# Patient Record
Sex: Female | Born: 1980
Health system: Southern US, Community
[De-identification: ages and names within clinical notes are randomized; demographics above are authoritative.]

## PROBLEM LIST (undated history)

## (undated) DIAGNOSIS — I10 Essential (primary) hypertension: Secondary | ICD-10-CM

## (undated) DIAGNOSIS — Z720 Tobacco use: Secondary | ICD-10-CM

## (undated) DIAGNOSIS — F4541 Pain disorder exclusively related to psychological factors: Secondary | ICD-10-CM

## (undated) DIAGNOSIS — D649 Anemia, unspecified: Secondary | ICD-10-CM

## (undated) DIAGNOSIS — R011 Cardiac murmur, unspecified: Secondary | ICD-10-CM

## (undated) HISTORY — DX: Tobacco use: Z72.0

## (undated) HISTORY — DX: Pain disorder exclusively related to psychological factors: F45.41

---

## 2004-03-15 HISTORY — PX: CHOLECYSTECTOMY, LAPAROSCOPIC: SHX56

## 2005-10-29 ENCOUNTER — Encounter (INDEPENDENT_AMBULATORY_CARE_PROVIDER_SITE_OTHER): Payer: Self-pay | Admitting: *Deleted

## 2005-10-29 ENCOUNTER — Observation Stay (HOSPITAL_COMMUNITY): Admission: EM | Admit: 2005-10-29 | Discharge: 2005-10-30 | Payer: Self-pay | Admitting: Emergency Medicine

## 2007-08-23 DIAGNOSIS — O1490 Unspecified pre-eclampsia, unspecified trimester: Secondary | ICD-10-CM

## 2007-08-29 ENCOUNTER — Emergency Department (HOSPITAL_COMMUNITY): Admission: EM | Admit: 2007-08-29 | Discharge: 2007-08-29 | Payer: Self-pay | Admitting: Emergency Medicine

## 2010-07-31 NOTE — Op Note (Signed)
Angel Moore, Angel Moore                ACCOUNT NO.:  [id number]   MEDICAL RECORD NO.:  [id number]          PATIENT TYPE:  OBV   LOCATION:  A332                          FACILITY:  [facility]   PHYSICIAN:  Dalia Heading, M.D.  DATE OF BIRTH:  [birth year]   DATE OF PROCEDURE:  10/29/2005  DATE OF DISCHARGE:                                 OPERATIVE REPORT   PREOPERATIVE DIAGNOSIS:  Cholecystitis, cholelithiasis.   POSTOPERATIVE DIAGNOSIS:  Cholecystitis, cholelithiasis.   PROCEDURE:  Laparoscopic cholecystectomy.   SURGEON:  Dr. Franky Macho.   ANESTHESIA:  General endotracheal.   INDICATIONS:  The patient is a 30 year old black female presents with  worsening right upper quadrant abdominal pain, nausea, vomiting.  The  patient underwent ultrasound in the emergency room and was found to have  acute cholecystitis with cholelithiasis.  The patient now comes to the  operating room for laparoscopic cholecystectomy.  Risks and benefits of the  procedure including bleeding, infection, hepatobiliary injury, and the  possibility of an open procedure were fully explained to the patient, who  gave informed consent.   PROCEDURE NOTE:  The patient was placed in a supine position.  After  induction of general endotracheal anesthesia, the abdomen was prepped and  draped using the usual sterile technique with Betadine.  Surgical site  confirmation was performed.   A supraumbilical incision was made down to the fascia.  Veress needle was  introduced into the abdominal cavity, and confirmation of placement was done  using the saline drop test.  The abdomen was then insufflated to 16 mmHg  pressure.  An 11-mm trocar was introduced into the abdominal cavity under  direct visualization without difficulty.  An additional 11-mm trocar was  placed in the epigastric region, 5-mm trocars placed in the right upper  quadrant right flank regions.  Liver was inspected and noted to be within  normal limits.  The  gallbladder was retracted superior and laterally.  The  dissection was begun around the infundibulum of the gallbladder.  The cystic  duct was first identified.  Its juncture to the infundibulum fully  identified.  Endoclips placed proximally and distally on the cystic duct,  and the cystic duct was divided.  This was likewise done to the cystic  artery.  The gallbladder then freed away from the gallbladder fossa using  Bovie electrocautery.  The gallbladder was delivered through the epigastric  trocar site using EndoCatch bag.  The gallbladder fossa was inspected.  No  abnormal bleeding or bile leakage was noted.  Surgicel was placed in the  gallbladder fossa.  All fluid and air were then evacuated from the abdominal  cavity prior to removal of the trocars.   All wounds were irrigated with normal saline.  All wounds were injected with  0.5% Sensorcaine.  The supraumbilical fascia as well as epigastric fascia  reapproximated using 0 Vicryl interrupted sutures.  All skin incisions were  closed using staples.  Betadine ointment and dry sterile dressings were  applied.   All tape and needle counts correct at the end of the procedure.  The patient  was extubated in the operating room and went back to the recovery room awake  in stable condition.   COMPLICATIONS:  None.   SPECIMEN:  Gallbladder with stones.   BLOOD LOSS:  Minimal.      Dalia Heading, M.D.  Electronically Signed     MAJ/MEDQ  D:  10/29/2005  T:  10/29/2005  Job:  [id number]   cc:   [facility]

## 2010-07-31 NOTE — H&P (Signed)
NAMEELEASE, SWARM                ACCOUNT NO.:  [id number]   MEDICAL RECORD NO.:  [id number]          PATIENT TYPE:  EMS   LOCATION:  ED                            FACILITY:  [facility]   PHYSICIAN:  Dalia Heading, M.D.  DATE OF BIRTH:  Sep 11, 1980   DATE OF ADMISSION:  10/29/2005  DATE OF DISCHARGE:  [facility]                                HISTORY & PHYSICAL   CHIEF COMPLAINT:  Cholecystitis, cholelithiasis.   HISTORY OF PRESENT ILLNESS:  The patient is a 30 year old black female who  presents with a 2-week history of worsening right upper quadrant abdominal  pain, nausea, vomiting.  She presented to the emergency room for further  evaluation treatment.  Ultrasound gallbladder was performed which revealed  cholelithiasis.  Common bile duct is within normal limits.   PAST MEDICAL HISTORY:  Is unremarkable.   PAST SURGICAL HISTORY:  Unremarkable.   CURRENT MEDICATIONS:  None.   ALLERGIES:  No known drug allergies.   REVIEW OF SYSTEMS:  Noncontributory.  The patient does smoke cigarettes.   PHYSICAL EXAMINATION:  Patient is well-developed, well-nourished black  female in no acute distress.  HEENT: Examination reveals no scleral icterus.  LUNGS:  Clear to auscultation with equal breath sounds bilaterally.  HEART:  Examination reveals regular rate and rhythm without S3, S4, murmurs.  ABDOMEN:  Soft with tenderness in the right upper quadrant to palpation.  No  hepatosplenomegaly, masses, hernias identified.   White blood cell count 13.6, hematocrit 39, platelet count 366.  MET7 is  unremarkable.  Liver enzyme tests were within normal limits.   IMPRESSION:  Cholecystitis, cholelithiasis.   PLAN:  The patient would brought to hospital for laparoscopic  cholecystectomy today.  Risks and benefits of the procedure including  bleeding, infection, hepatobiliary, possibly an open procedure were fully  explained to the patient, gave informed consent.      Dalia Heading, M.D.  Electronically Signed     MAJ/MEDQ  D:  10/29/2005  T:  10/29/2005  Job:  [id number]   cc:   Short stay at [facility]

## 2010-12-10 LAB — URINE MICROSCOPIC-ADD ON

## 2010-12-10 LAB — CBC
MCHC: 34.7
MCV: 93.5
RBC: 3.55 — ABNORMAL LOW
RDW: 13.1
WBC: 15.5 — ABNORMAL HIGH

## 2010-12-10 LAB — COMPREHENSIVE METABOLIC PANEL
BUN: 5 — ABNORMAL LOW
Calcium: 8.7
Glucose, Bld: 110 — ABNORMAL HIGH
Sodium: 142
Total Protein: 6.2

## 2010-12-10 LAB — DIFFERENTIAL
Lymphs Abs: 2.6
Monocytes Relative: 7
Neutro Abs: 11.5 — ABNORMAL HIGH
Neutrophils Relative %: 74

## 2010-12-10 LAB — URINALYSIS, ROUTINE W REFLEX MICROSCOPIC
Leukocytes, UA: NEGATIVE
Protein, ur: NEGATIVE
Urobilinogen, UA: 0.2

## 2010-12-10 LAB — PROTIME-INR
INR: 0.9
Prothrombin Time: 12.4

## 2010-12-10 LAB — APTT: aPTT: 28

## 2017-12-04 ENCOUNTER — Emergency Department (HOSPITAL_COMMUNITY): Payer: Medicare Other

## 2017-12-04 ENCOUNTER — Emergency Department (HOSPITAL_COMMUNITY)
Admission: EM | Admit: 2017-12-04 | Discharge: 2017-12-05 | Disposition: A | Discharge status: Home / Self Care | Payer: Medicare Other | Attending: Emergency Medicine | Admitting: Emergency Medicine

## 2017-12-04 ENCOUNTER — Encounter (HOSPITAL_COMMUNITY): Payer: Self-pay | Admitting: Emergency Medicine

## 2017-12-04 ENCOUNTER — Other Ambulatory Visit: Payer: Self-pay

## 2017-12-04 DIAGNOSIS — D509 Iron deficiency anemia, unspecified: Secondary | ICD-10-CM

## 2017-12-04 DIAGNOSIS — D649 Anemia, unspecified: Secondary | ICD-10-CM | POA: Diagnosis not present

## 2017-12-04 DIAGNOSIS — R1013 Epigastric pain: Secondary | ICD-10-CM | POA: Diagnosis not present

## 2017-12-04 DIAGNOSIS — F17210 Nicotine dependence, cigarettes, uncomplicated: Secondary | ICD-10-CM | POA: Diagnosis not present

## 2017-12-04 LAB — COMPREHENSIVE METABOLIC PANEL
ALT: 10 U/L (ref 0–44)
ANION GAP: 6 (ref 5–15)
AST: 18 U/L (ref 15–41)
Albumin: 3.8 g/dL (ref 3.5–5.0)
Alkaline Phosphatase: 68 U/L (ref 38–126)
BUN: 6 mg/dL (ref 6–20)
CHLORIDE: 109 mmol/L (ref 98–111)
CO2: 24 mmol/L (ref 22–32)
Calcium: 8.7 mg/dL — ABNORMAL LOW (ref 8.9–10.3)
Creatinine, Ser: 0.59 mg/dL (ref 0.44–1.00)
GFR calc non Af Amer: >60 mL/min (ref >60)
Glucose, Bld: 110 mg/dL — ABNORMAL HIGH (ref 70–99)
Potassium: 3.9 mmol/L (ref 3.5–5.1)
SODIUM: 139 mmol/L (ref 135–145)
Total Bilirubin: 0.7 mg/dL (ref 0.3–1.2)
Total Protein: 7.2 g/dL (ref 6.5–8.1)

## 2017-12-04 LAB — CBC
HCT: 25.7 % — ABNORMAL LOW (ref 36.0–46.0)
HEMOGLOBIN: 6.9 g/dL — AB (ref 12.0–15.0)
MCH: 16.2 pg — AB (ref 26.0–34.0)
MCHC: 26.8 g/dL — AB (ref 30.0–36.0)
MCV: 60.5 fL — AB (ref 78.0–100.0)
Platelets: 283 10*3/uL (ref 150–400)
RBC: 4.25 MIL/uL (ref 3.87–5.11)
RDW: 20.5 % — ABNORMAL HIGH (ref 11.5–15.5)
WBC: 11.5 10*3/uL — ABNORMAL HIGH (ref 4.0–10.5)

## 2017-12-04 LAB — DIFFERENTIAL
Basophils Absolute: 0.1 10*3/uL (ref 0.0–0.1)
Basophils Relative: 0 %
Eosinophils Absolute: 0.1 10*3/uL (ref 0.0–0.7)
Eosinophils Relative: 1 %
LYMPHS ABS: 3.1 10*3/uL (ref 0.7–4.0)
LYMPHS PCT: 27 %
MONO ABS: 0.9 10*3/uL (ref 0.1–1.0)
Monocytes Relative: 8 %
NEUTROS ABS: 7.5 10*3/uL (ref 1.7–7.7)
NEUTROS PCT: 64 %

## 2017-12-04 LAB — URINALYSIS, ROUTINE W REFLEX MICROSCOPIC
BILIRUBIN URINE: NEGATIVE
GLUCOSE, UA: NEGATIVE mg/dL
Hgb urine dipstick: NEGATIVE
Ketones, ur: NEGATIVE mg/dL
Leukocytes, UA: NEGATIVE
Nitrite: NEGATIVE
PH: 6 (ref 5.0–8.0)
Protein, ur: NEGATIVE mg/dL
SPECIFIC GRAVITY, URINE: 1.005 (ref 1.005–1.030)

## 2017-12-04 LAB — LIPASE, BLOOD: LIPASE: 34 U/L (ref 11–51)

## 2017-12-04 LAB — PREGNANCY, URINE: Preg Test, Ur: NEGATIVE

## 2017-12-04 LAB — POC OCCULT BLOOD, ED: Fecal Occult Bld: NEGATIVE

## 2017-12-04 IMAGING — DX DG ABDOMEN ACUTE W/ 1V CHEST
3 series · 3 of 3 positions shown · non-contrast
Comparison: [DATE] abdominal ultrasound.

CLINICAL DATA: 36 y/o  F; intermittent abdominal pain for a year.

EXAM:
DG ABDOMEN ACUTE W/ 1V CHEST

[chest pa]
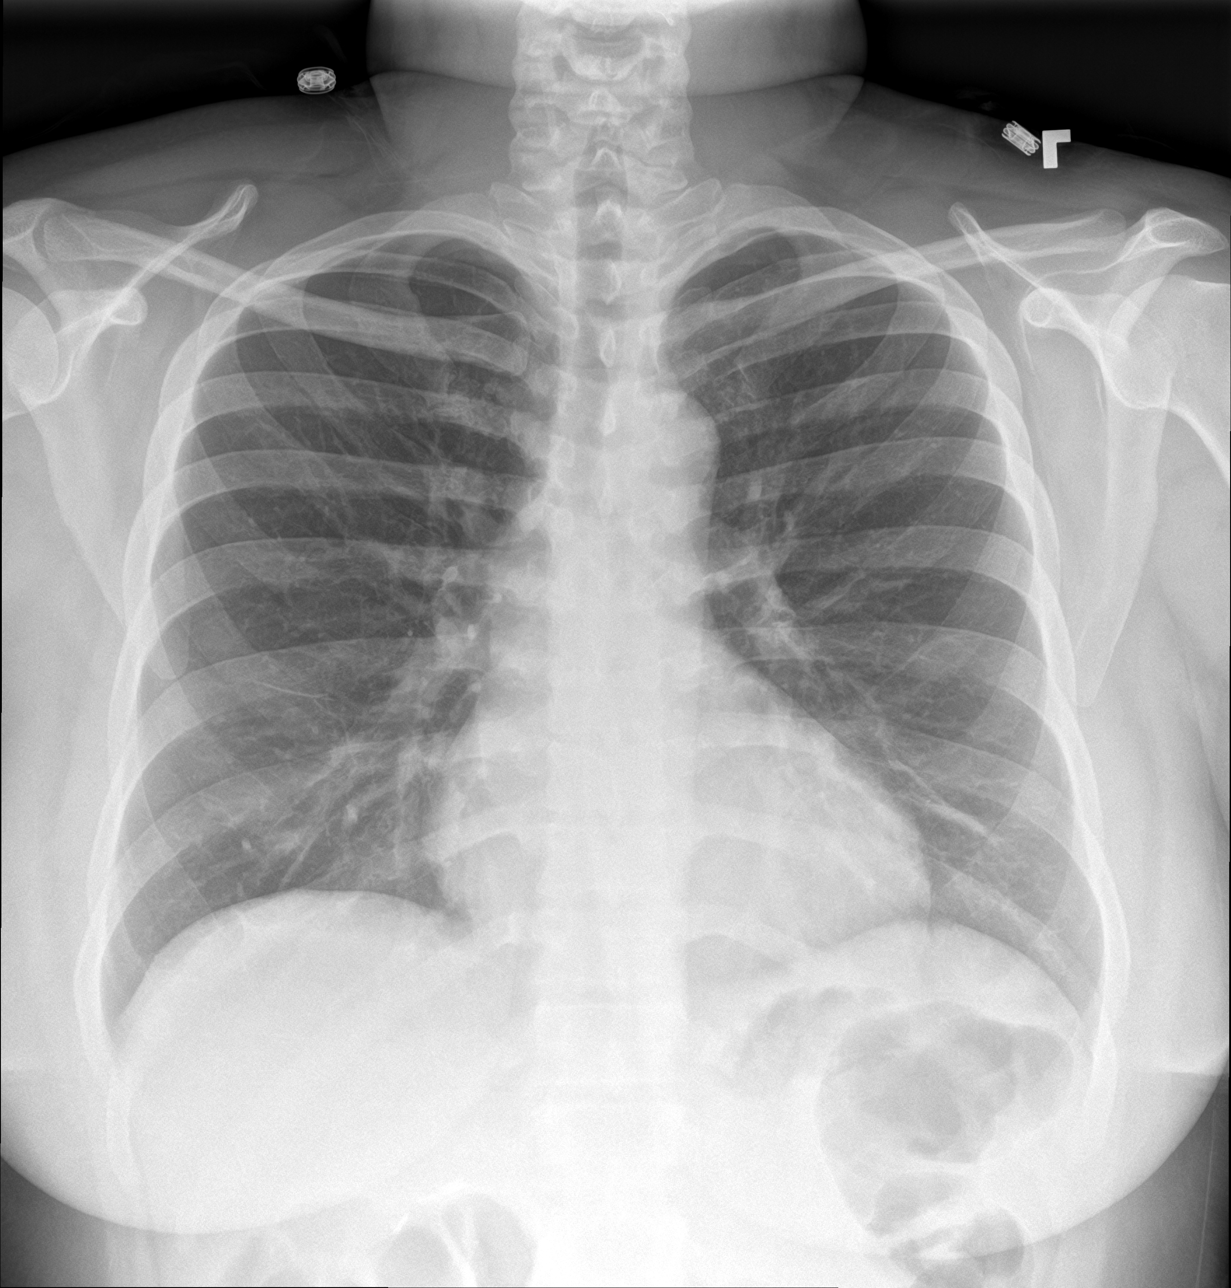

[abdomen erect]
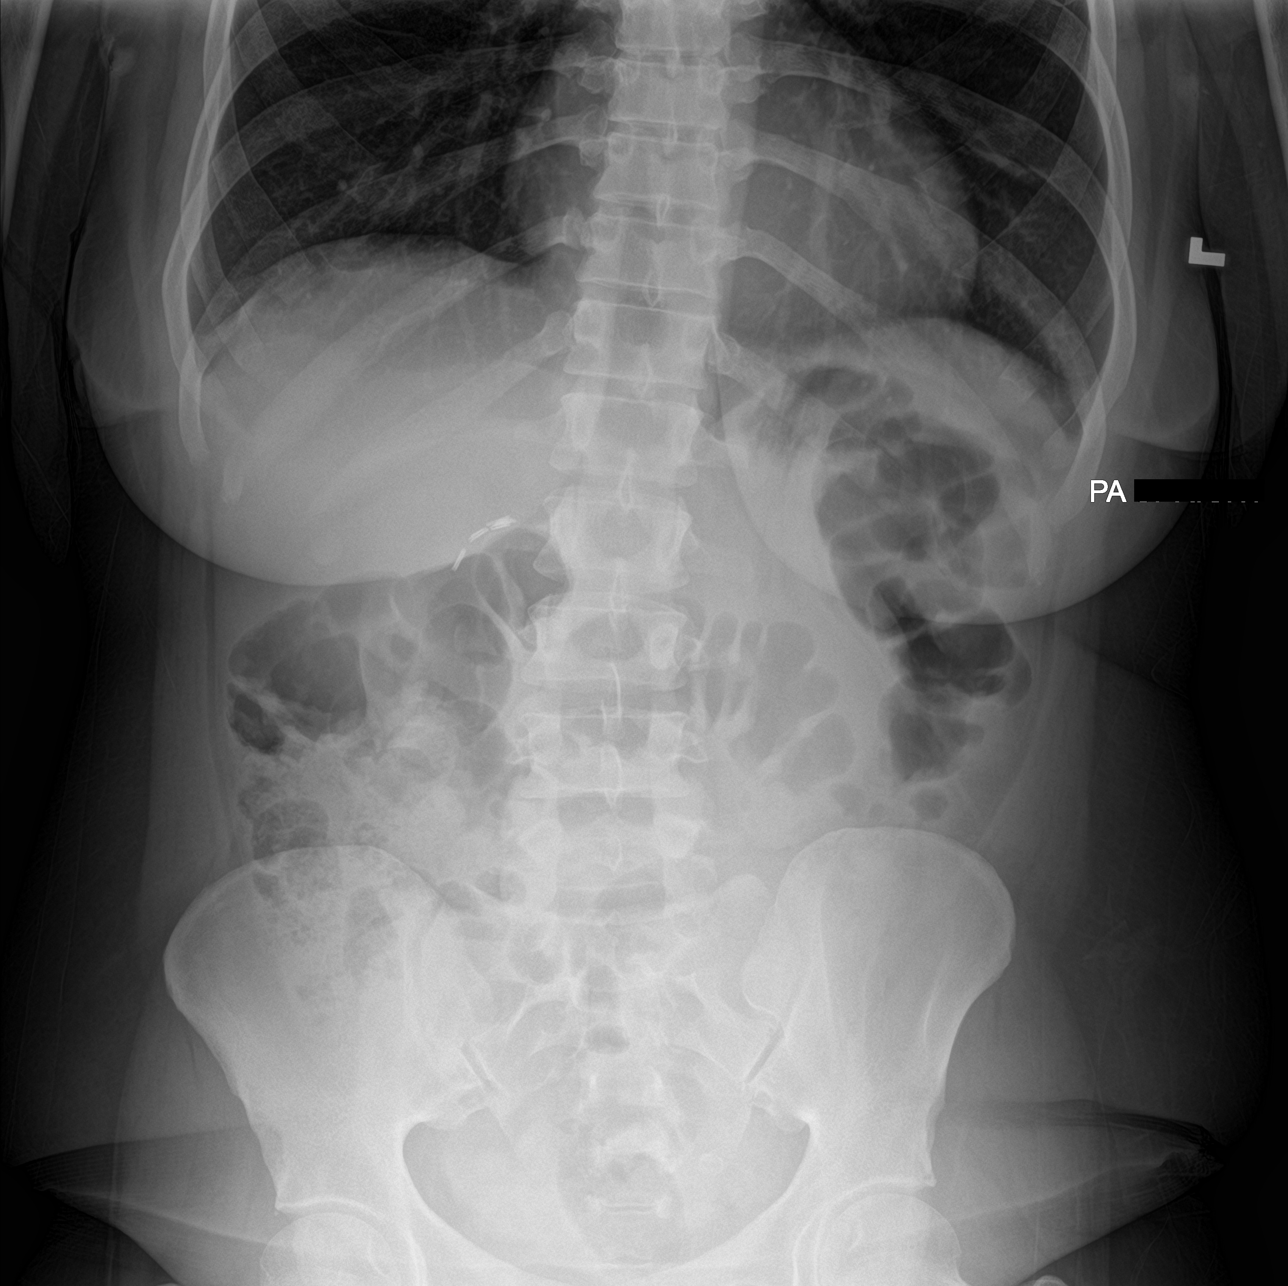

[abdomen supine]
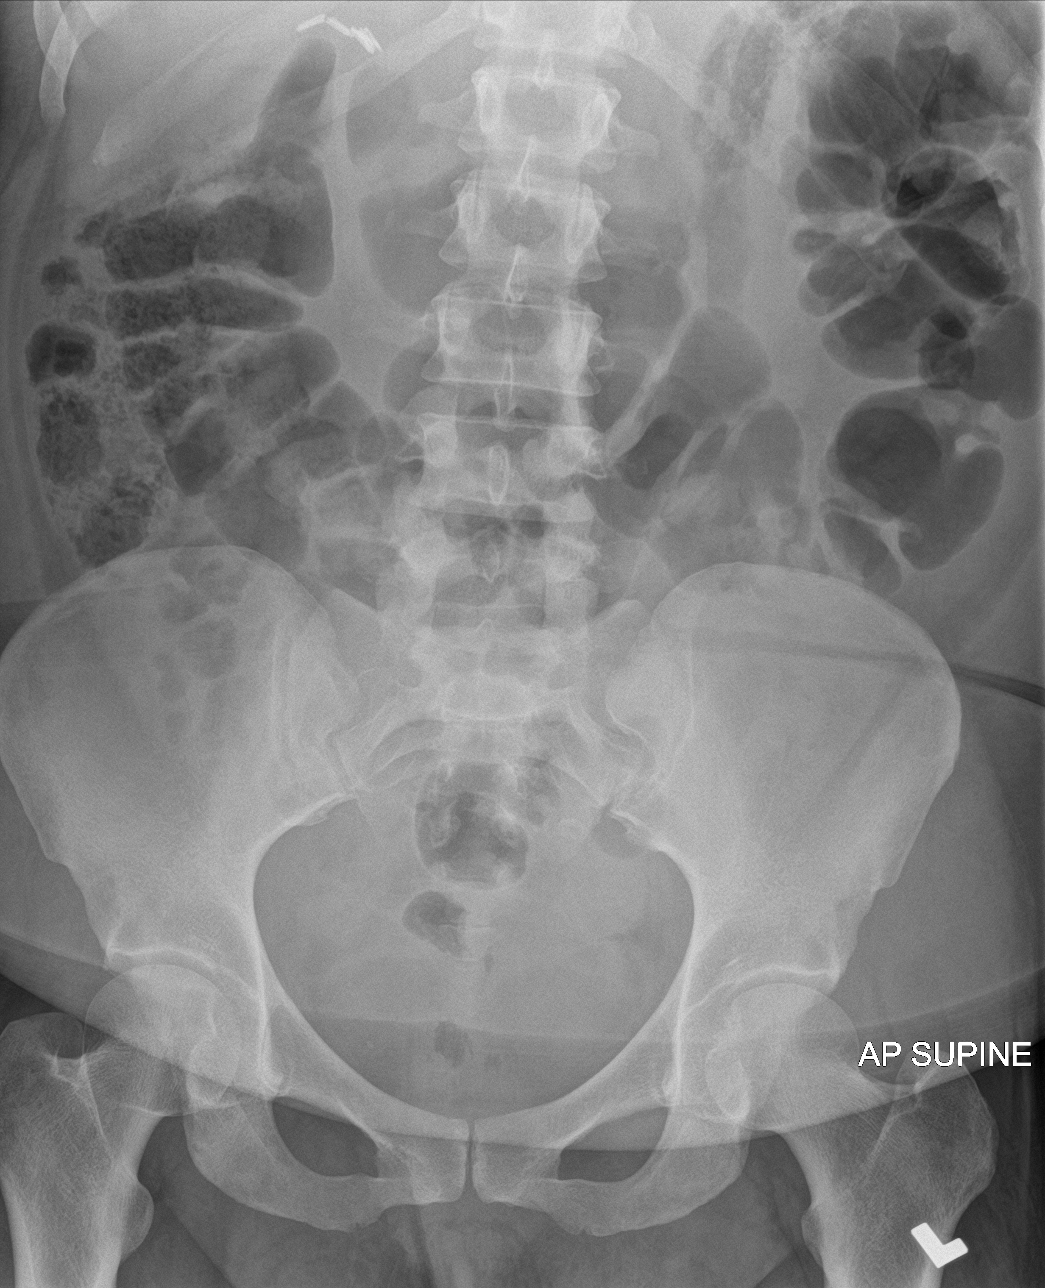

[3 of 3 positions shown; findings below may reference images not displayed]

FINDINGS: There is no evidence of dilated bowel loops or free intraperitoneal
air. No radiopaque calculi or other significant radiographic
abnormality is seen. Heart size and mediastinal contours are within
normal limits. Linear opacities at the lung bases are compatible
with minor atelectasis or scarring. No consolidation, effusion, or
pneumothorax. Right upper quadrant cholecystectomy clips.
IMPRESSION: Negative abdominal radiographs.  No acute cardiopulmonary disease.

By: QWEEN M.D.

## 2017-12-04 MED ORDER — ONDANSETRON HCL 4 MG/2ML IJ SOLN
4.0000 mg | Freq: Once | INTRAMUSCULAR | Status: AC
  Administered 2017-12-04: 4 mg via INTRAVENOUS
  Filled 2017-12-04: qty 2

## 2017-12-04 MED ORDER — MORPHINE SULFATE (PF) 4 MG/ML IV SOLN
4.0000 mg | Freq: Once | INTRAVENOUS | Status: AC
Start: 2017-12-04 — End: 2017-12-04
  Administered 2017-12-04: 4 mg via INTRAVENOUS
  Filled 2017-12-04: qty 1

## 2017-12-04 MED ORDER — KETOROLAC TROMETHAMINE 30 MG/ML IJ SOLN: 15.0000 mg | Freq: Once | INTRAMUSCULAR | Status: DC

## 2017-12-04 NOTE — ED Notes (Signed)
Date and time results received: 12/04/17 2216 (use smartphrase ".now" to insert current time)  Test: Hgb  Critical Value: 6.9  Name of Provider Notified: Burgess AmorJulie Idol PA  Orders Received? Or Actions Taken?: no/na

## 2017-12-04 NOTE — ED Provider Notes (Signed)
[facility] EMERGENCY DEPARTMENT Provider Note   CSN: [id number] Arrival date & time: 12/04/17  2041     History   Chief Complaint Chief Complaint  Patient presents with  . Abdominal Pain    HPI Angel Moore is a 37 y.o. female with no significant medical history,surgical history including laparoscopic cholecystectomy presenting with a one year history of intermittent upper abdominal aching, cramping pain with occasional stabs which she has associated with  Constipation. She is here today due to prolonged pain which started this morning and has been waxing and waning all day.  She denies n/v/d , no fevers or chills, denies chest pain and sob.  She had a small bowel movement which has not relieved her pain. Normal flatus.  She has taken colace stool softener once or twice this past week with no significant improvement in this symptom.  She has had reduced appetite due to discomfort.  She does report weight loss the past few months but endorses this has been intentional.  The history is provided by the patient.    History reviewed. No pertinent past medical history.  There are no active problems to display for this patient.   History reviewed. No pertinent surgical history.   OB History   None      Home Medications    Prior to Admission medications   Medication Sig Start Date End Date Taking? Authorizing Provider  Aspirin-Salicylamide-Caffeine (BC HEADACHE POWDER PO) Take 1 Package by mouth daily as needed (headache).   Yes [provider]  ferrous sulfate 325 (65 FE) MG tablet Take 1 tablet (325 mg total) by mouth daily. 12/05/17   Burgess Amor, PA-C  pantoprazole (PROTONIX) 40 MG tablet Take 1 tablet (40 mg total) by mouth daily. 12/05/17   Burgess Amor, PA-C  sucralfate (CARAFATE) 1 GM/10ML suspension Take 10 mLs (1 g total) by mouth 3 (three) times daily as needed (abdominal pain). 12/05/17   Burgess Amor, PA-C    Family History History reviewed. No pertinent  family history.  Social History Social History   Tobacco Use  . Smoking status: Current Every Day Smoker    Packs/day: 0.50    Types: Cigarettes  . Smokeless tobacco: Never Used  Substance Use Topics  . Alcohol use: Yes    Comment: occasionally  . Drug use: Not Currently     Allergies   Patient has no known allergies.   Review of Systems Review of Systems  Constitutional: Negative for chills and fever.  HENT: Negative for congestion and sore throat.   Eyes: Negative.   Respiratory: Negative for chest tightness and shortness of breath.   Cardiovascular: Negative for chest pain.  Gastrointestinal: Positive for abdominal pain and constipation. Negative for abdominal distention, nausea and vomiting.  Genitourinary: Negative.   Musculoskeletal: Negative for arthralgias, joint swelling and neck pain.  Skin: Negative.  Negative for rash and wound.  Neurological: Negative for dizziness, weakness, light-headedness, numbness and headaches.  Psychiatric/Behavioral: Negative.      Physical Exam Updated Vital Signs BP (!) 147/97   Pulse 86   Temp 97.9 F (36.6 C) (Oral)   Resp 16   Ht 5\' 2"  (1.575 m)   Wt 83.9 kg   LMP 11/21/2017   SpO2 98%   BMI 33.84 kg/m   Physical Exam  Constitutional: She appears well-developed and well-nourished.  HENT:  Head: Normocephalic and atraumatic.  Eyes: Conjunctivae are normal.  Neck: Normal range of motion.  Cardiovascular: Normal rate, regular rhythm, normal heart  sounds and intact distal pulses.  Pulmonary/Chest: Effort normal and breath sounds normal. She has no wheezes.  Abdominal: Soft. Normal appearance and bowel sounds are normal. She exhibits no distension. There is tenderness in the right upper quadrant and epigastric area. There is no rigidity, no guarding and negative Murphy's sign. No hernia.  Musculoskeletal: Normal range of motion.  Neurological: She is alert.  Skin: Skin is warm and dry.  Psychiatric: She has a normal  mood and affect.  Nursing note and vitals reviewed.    ED Treatments / Results  Labs (all labs ordered are listed, but only abnormal results are displayed) Labs Reviewed  COMPREHENSIVE METABOLIC PANEL - Abnormal; Notable for the following components:      Result Value   Glucose, Bld 110 (*)    Calcium 8.7 (*)    All other components within normal limits  CBC - Abnormal; Notable for the following components:   WBC 11.5 (*)    Hemoglobin 6.9 (*)    HCT 25.7 (*)    MCV 60.5 (*)    MCH 16.2 (*)    MCHC 26.8 (*)    RDW 20.5 (*)    All other components within normal limits  URINALYSIS, ROUTINE W REFLEX MICROSCOPIC - Abnormal; Notable for the following components:   Color, Urine STRAW (*)    All other components within normal limits  IRON AND TIBC - Abnormal; Notable for the following components:   Iron 10 (*)    TIBC 521 (*)    Saturation Ratios 2 (*)    All other components within normal limits  FERRITIN - Abnormal; Notable for the following components:   Ferritin 1 (*)    All other components within normal limits  LIPASE, BLOOD  PREGNANCY, URINE  DIFFERENTIAL  VITAMIN B12  FOLATE  RETICULOCYTES  POC OCCULT BLOOD, ED    EKG None  Radiology Ct Abdomen Pelvis W Contrast  Result Date: 12/05/2017 CLINICAL DATA:  38 year old female with generalized abdominal pain and weight loss. EXAM: CT ABDOMEN AND PELVIS WITH CONTRAST TECHNIQUE: Multidetector CT imaging of the abdomen and pelvis was performed using the standard protocol following bolus administration of intravenous contrast. CONTRAST:  ISOVUE-300 IOPAMIDOL (ISOVUE-300) INJECTION 61% COMPARISON:  Abdominal radiograph dated 12/04/2017 FINDINGS: Lower chest: The visualized lung bases are clear. No intra-abdominal free air.  Trace free fluid within the pelvis Hepatobiliary: Cholecystectomy. No intrahepatic biliary ductal dilatation. The liver is unremarkable. Pancreas: Unremarkable. No pancreatic ductal dilatation or  surrounding inflammatory changes. Spleen: Normal in size without focal abnormality. Adrenals/Urinary Tract: Adrenal glands are unremarkable. Kidneys are normal, without renal calculi, focal lesion, or hydronephrosis. Bladder is unremarkable. Stomach/Bowel: An area of apparent irregularity in the distal stomach involving the gastric antrum/pylorus (series 2, image 32 and coronal series 5, image 43) may represent normal gastric fold versus a peptic ulcer. Mild apparent thickening of the wall of the distal stomach in this region may be artifactual or represent edema. Clinical correlation is recommended. There is no bowel obstruction. Normal appendix. Vascular/Lymphatic: No significant vascular findings are present. No enlarged abdominal or pelvic lymph nodes. Reproductive: The uterus is grossly unremarkable. There is a 2 cm left ovarian corpus luteum. The right ovary is unremarkable. Other: None Musculoskeletal: No acute or significant osseous findings. IMPRESSION: Apparent area of irregularity and thickening involving the distal stomach may be artifactual or represent a peptic ulcer. Clinical correlation is recommended. No other acute intra-abdominopelvic pathology. Electronically Signed   By: Ceasar Mons.D.  On: 12/05/2017 01:08   Dg Abd Acute W/chest  Result Date: 12/04/2017 CLINICAL DATA:  37 y/o  F; intermittent abdominal pain for a year. EXAM: DG ABDOMEN ACUTE W/ 1V CHEST COMPARISON:  10/29/2005 abdominal ultrasound. FINDINGS: There is no evidence of dilated bowel loops or free intraperitoneal air. No radiopaque calculi or other significant radiographic abnormality is seen. Heart size and mediastinal contours are within normal limits. Linear opacities at the lung bases are compatible with minor atelectasis or scarring. No consolidation, effusion, or pneumothorax. Right upper quadrant cholecystectomy clips. IMPRESSION: Negative abdominal radiographs.  No acute cardiopulmonary disease. Electronically  Signed   By: Mitzi HansenLance  Furusawa-Stratton M.D.   On: 12/04/2017 23:31    Procedures Procedures (including critical care time)  Medications Ordered in ED Medications  ondansetron (ZOFRAN) injection 4 mg (4 mg Intravenous Given 12/04/17 2340)  morphine 4 MG/ML injection 4 mg (4 mg Intravenous Given 12/04/17 2340)  iopamidol (ISOVUE-300) 61 % injection 100 mL (100 mLs Intravenous Contrast Given 12/05/17 0048)     Initial Impression / Assessment and Plan / ED Course  I have reviewed the triage vital signs and the nursing notes.  Pertinent labs & imaging results that were available during my care of the patient were reviewed by me and considered in my medical decision making (see chart for details).     Pt with possible gastric ulcer as source of sx.  She denies however, any significant h/o gerd like sx.  She was advised she needs f/u with GI and will probably require and upper endoscopy to confirm the CT findings. She understands and will call for appt.  Placed on protonix and carafate for tx and sx relief.  Additionally pt is profoundly anemic for unclear source. Hemoccult negative. Denies heavy periods, generally 5 days duration and not problematic. Denies weakness, dizziness, sob or other sx of anemia.  Anemia panel was added to labs prior to dc, ferrous sulfate started. Advised close f/u with pcp regarding this lab finding.    The patient appears reasonably screened and/or stabilized for discharge and I doubt any other medical condition or other [facility] requiring further screening, evaluation, or treatment in the ED at this time prior to discharge.   Final Clinical Impressions(s) / ED Diagnoses   Final diagnoses:  Epigastric pain  Iron deficiency anemia, unspecified iron deficiency anemia type    ED Discharge Orders         Ordered    ferrous sulfate 325 (65 FE) MG tablet  Daily     12/05/17 0205    pantoprazole (PROTONIX) 40 MG tablet  Daily     12/05/17 0205    sucralfate (CARAFATE) 1  GM/10ML suspension  3 times daily PRN     12/05/17 0205           Burgess AmorIdol, Kamorah Nevils, PA-C 12/05/17 1347    Samuel JesterMcManus, Kathleen, DO 12/07/17 1557

## 2017-12-04 NOTE — ED Triage Notes (Signed)
Pt c/o abd pain for a year intermittently. Denies N/V/D/, GU sx or fever.

## 2017-12-05 ENCOUNTER — Emergency Department (HOSPITAL_COMMUNITY): Payer: Medicare Other

## 2017-12-05 DIAGNOSIS — R1013 Epigastric pain: Secondary | ICD-10-CM | POA: Diagnosis not present

## 2017-12-05 LAB — VITAMIN B12: VITAMIN B 12: 258 pg/mL (ref 180–914)

## 2017-12-05 LAB — RETICULOCYTES
RBC.: 4.24 MIL/uL (ref 3.87–5.11)
Retic Count, Absolute: 63.6 10*3/uL (ref 19.0–186.0)
Retic Ct Pct: 1.5 % (ref 0.4–3.1)

## 2017-12-05 LAB — IRON AND TIBC
Iron: 10 ug/dL — ABNORMAL LOW (ref 28–170)
Saturation Ratios: 2 % — ABNORMAL LOW (ref 10.4–31.8)
TIBC: 521 ug/dL — ABNORMAL HIGH (ref 250–450)
UIBC: 511 ug/dL

## 2017-12-05 LAB — FOLATE: FOLATE: 8.4 ng/mL (ref >5.9)

## 2017-12-05 LAB — FERRITIN: Ferritin: 1 ng/mL — ABNORMAL LOW (ref 11–307)

## 2017-12-05 MED ORDER — PANTOPRAZOLE SODIUM 40 MG PO TBEC: 40.0000 mg | DELAYED_RELEASE_TABLET | Freq: Every day | ORAL | 0 refills | Status: DC

## 2017-12-05 MED ORDER — IOPAMIDOL (ISOVUE-300) INJECTION 61%
100.0000 mL | Freq: Once | INTRAVENOUS | Status: AC | PRN
  Administered 2017-12-05: 100 mL via INTRAVENOUS

## 2017-12-05 MED ORDER — FERROUS SULFATE 325 (65 FE) MG PO TABS: 325.0000 mg | ORAL_TABLET | Freq: Every day | ORAL | 0 refills | Status: DC

## 2017-12-05 MED ORDER — SUCRALFATE 1 GM/10ML PO SUSP: 1.0000 g | Freq: Three times a day (TID) | ORAL | 0 refills | Status: DC | PRN

## 2017-12-05 NOTE — Discharge Instructions (Addendum)
As discussed your blood tests show that you are very anemic with your hemoglobin at 6.9 g/dL (normal range for hemoglobin is 12.0 - 15).  Your anemia appears to be due to iron deficiency so you have been prescribed an iron supplement.  You will need to contact your doctor - let her know these results so she can guide you further.  There is an anemia panel blood test that has been added to your tests that will not result tonight, but your doctor can followup (you can check for the results in Mychart, too).  Also, your abdominal pain may be from a stomach ulcer.  You need to see a [facility] for this. Call Dr. Jena Gaussourk for an appointment. In the interim, you have been prescribed medicine to help you with this problem.

## 2017-12-12 ENCOUNTER — Encounter: Payer: Self-pay | Admitting: Gastroenterology

## 2018-01-26 ENCOUNTER — Other Ambulatory Visit: Payer: Self-pay

## 2018-01-26 ENCOUNTER — Telehealth: Payer: Self-pay

## 2018-01-26 ENCOUNTER — Encounter: Payer: Self-pay | Admitting: Gastroenterology

## 2018-01-26 ENCOUNTER — Ambulatory Visit (INDEPENDENT_AMBULATORY_CARE_PROVIDER_SITE_OTHER): Payer: Medicare Other | Admitting: Gastroenterology

## 2018-01-26 DIAGNOSIS — R1013 Epigastric pain: Secondary | ICD-10-CM | POA: Insufficient documentation

## 2018-01-26 DIAGNOSIS — D500 Iron deficiency anemia secondary to blood loss (chronic): Secondary | ICD-10-CM | POA: Insufficient documentation

## 2018-01-26 DIAGNOSIS — K5900 Constipation, unspecified: Secondary | ICD-10-CM | POA: Insufficient documentation

## 2018-01-26 DIAGNOSIS — K5901 Slow transit constipation: Secondary | ICD-10-CM | POA: Diagnosis not present

## 2018-01-26 HISTORY — DX: Epigastric pain: R10.13

## 2018-01-26 HISTORY — DX: Constipation, unspecified: K59.00

## 2018-01-26 MED ORDER — LIDOCAINE VISCOUS HCL 2 % MT SOLN: OROMUCOSAL | 5 refills | Status: DC

## 2018-01-26 MED ORDER — LINACLOTIDE 290 MCG PO CAPS: ORAL_CAPSULE | ORAL | 11 refills | Status: DC

## 2018-01-26 MED ORDER — PANTOPRAZOLE SODIUM 40 MG PO TBEC: DELAYED_RELEASE_TABLET | ORAL | 11 refills | Status: DC

## 2018-01-26 NOTE — Telephone Encounter (Signed)
Called and informed endo scheduler pt will need CBC and ferritin with pre-op labs. Orders entered with EGD order.

## 2018-01-26 NOTE — Assessment & Plan Note (Signed)
SYMPTOMS NOT CONTROLLED.  TO REDUCE CONSTIPATION:    1. DRINK WATER TO KEEP YOUR URINE LIGHT YELLOW.    2. FOLLOW A HIGH FIBER DIET. SEE INFO BELOW.    3. ADD LINZESS WITH YOUR FIRST MEAL. IT MAY CAUSE EXPLOSIVE DIARRHEA. FOLLOW UP IN 4 MOS.

## 2018-01-26 NOTE — Patient Instructions (Signed)
YOUR ABDOMINAL PAIN IS DUE TO GASTRITIS AND CONSTIPATION. YOUR LOW BLOOD COUNT IS DUE TO MENSES MOST LIKELY.   TO REDUCE CONSTIPATION:    1. DRINK WATER TO KEEP YOUR URINE LIGHT YELLOW.    2. FOLLOW A HIGH FIBER DIET. SEE INFO BELOW.    3. ADD LINZESS WITH YOUR FIRST MEAL. IT MAY CAUSE EXPLOSIVE DIARRHEA.     TO TREAT GASTRITIS:    1. AVOID ITEMS THAT CAUSE GASTRITIS. SEE INFO BELOW.    2. CONTINUE PROTONIX. TAKE 30 MINUTES PRIOR TO MEALS TWICE DAILY.    3. USE VISCOUS LIDOCAINE 2 TSP EVERY 4 HOURS WHEN NEEDED FOR FLARES OF HEARTBURN, CHEST PAIN, OR UPPER ABDOMINAL PAIN. USE A SYRINGE TO INJECT INTO THE BACK OF YOUR THROAT. USE NO MORE THAN 8 DOSES A DAY. IT WILL MAKE YOUR MOUTH, ESOPHAGUS, AND STOMACH NUMB   TO TREAT YOUR LOW BLOOD COUNT, TAKE IRON PILLS THREE TIMES A DAY.  COMPLETE UPPER ENDOSCOPY.  FOLLOW UP IN 4 MOS.   Gastritis  Gastritis is an inflammation (the body's way of reacting to injury and/or infection) of the stomach. It is often caused by bacterial (germ) infections. It can also be caused BY ASPIRIN, BC/GOODY POWDER'S, (IBUPROFEN) MOTRIN, OR ALEVE (NAPROXEN), chemicals (including alcohol), SPICY FOODS, and medications. This illness may be associated with generalized malaise (feeling tired, not well), UPPER ABDOMINAL STOMACH cramps, and fever. One common bacterial cause of gastritis is an organism known as H. Pylori. This can be treated with antibiotics.   High-Fiber Diet A high-fiber diet changes your normal diet to include more whole grains, legumes, fruits, and vegetables. Changes in the diet involve replacing refined carbohydrates with unrefined foods. The calorie level of the diet is essentially unchanged. The Dietary Reference Intake (recommended amount) for adult males is 38 grams per day. For adult females, it is 25 grams per day. Pregnant and lactating women should consume 28 grams of fiber per day. Fiber is the intact part of a plant that is not broken down during  digestion. Functional fiber is fiber that has been isolated from the plant to provide a beneficial effect in the body.  PURPOSE  Increase stool bulk.   Ease and regulate bowel movements.   Lower cholesterol.   REDUCE RISK OF COLON CANCER  INDICATIONS THAT YOU NEED MORE FIBER  Constipation and hemorrhoids.   Uncomplicated diverticulosis (intestine condition) and irritable bowel syndrome.   Weight management.   As a protective measure against hardening of the arteries (atherosclerosis), diabetes, and cancer.   GUIDELINES FOR INCREASING FIBER IN THE DIET  Start adding fiber to the diet slowly. A gradual increase of about 5 more grams (2 slices of whole-wheat bread, 2 servings of most fruits or vegetables, or 1 bowl of high-fiber cereal) per day is best. Too rapid an increase in fiber may result in constipation, flatulence, and bloating.   Drink enough water and fluids to keep your urine clear or pale yellow. Water, juice, or caffeine-free drinks are recommended. Not drinking enough fluid may cause constipation.   Eat a variety of high-fiber foods rather than one type of fiber.   Try to increase your intake of fiber through using high-fiber foods rather than fiber pills or supplements that contain small amounts of fiber.   The goal is to change the types of food eaten. Do not supplement your present diet with high-fiber foods, but replace foods in your present diet.   INCLUDE A VARIETY OF FIBER SOURCES  Replace refined  and processed grains with whole grains, canned fruits with fresh fruits, and incorporate other fiber sources. White rice, white breads, and most bakery goods contain little or no fiber.   Brown whole-grain rice, buckwheat oats, and many fruits and vegetables are all good sources of fiber. These include: broccoli, Brussels sprouts, cabbage, cauliflower, beets, sweet potatoes, white potatoes (skin on), carrots, tomatoes, eggplant, squash, berries, fresh fruits, and  dried fruits.   Cereals appear to be the richest source of fiber. Cereal fiber is found in whole grains and bran. Bran is the fiber-rich outer coat of cereal grain, which is largely removed in refining. In whole-grain cereals, the bran remains. In breakfast cereals, the largest amount of fiber is found in those with "bran" in their names. The fiber content is sometimes indicated on the label.   You may need to include additional fruits and vegetables each day.   In baking, for 1 cup white flour, you may use the following substitutions:   1 cup whole-wheat flour minus 2 tablespoons.   1/2 cup white flour plus 1/2 cup whole-wheat flour.

## 2018-01-26 NOTE — Progress Notes (Signed)
ON RECALL  °

## 2018-01-26 NOTE — Telephone Encounter (Signed)
EGD w/Propfol w/SLF scheduled for 01/31/18 at 0730. Endo scheduler called. Procedure time changed to 0830 d/t pre-op nurse had already moved someone up to 0730 slot. Called and informed pt of procedure time change.

## 2018-01-26 NOTE — H&P (View-Only) (Signed)
Subjective:    Patient ID: Angel Moore, female    DOB: March 01, 1981, 37 y.o.   MRN: [id number]  [facility]   HPI HAVING ABDOMINAL PAIN WORSE AT NIGHT FOR PAST YEAR. GETS RELIEF FROM BALLING UP AND PUTTING PILLOW ON HER STOMACH AND THEN IT STOPS. SHARP-STARTS IN RUQ AND MOVES TO HER CHEST. NO PMHx: GERD. USES BC AND IBUPROFEN. BC POWDERS: EVERY 2 WEEKS OR SO. IBUPROFEN: EVERY 2 WEEKS. USES THEM FOR HEADACHES. NO PPI OR ZANTAC/PEPCID. TOOK CARAFATE AND PROTONIX FOR 1 WEEK BUT IT DIDN'T WORK. BMs: CONSTIPATION, 2X/MO. TRIED: MOM, ENEMAS, DULCOLAX. STILL HAS MENSES EVERY MON, NL FLOW. NO RECREATIONAL DRUGS.  PT DENIES FEVER, CHILLS, HEMATOCHEZIA, HEMATEMESIS, nausea, vomiting, melena, diarrhea, CHEST PAIN, SHORTNESS OF BREATH,  CHANGE IN BOWEL IN HABITS, problems swallowing, problems with sedation, OR heartburn or indigestion.  Past Medical History:  Diagnosis Date  . Stress headaches   . Tobacco abuse    1 PK/WEEK   Past Surgical History:  Procedure Laterality Date  . CESAREAN SECTION  08/2007  . CHOLECYSTECTOMY, LAPAROSCOPIC  2006    No Known Allergies  Current Outpatient Medications  Medication Sig Dispense Refill  . Aspirin-Salicylamide-Caffeine (BC HEADACHE POWDER PO) Take 1 Package by mouth daily as needed (headache).    Marland Kitchen. ibuprofen (ADVIL,MOTRIN) 200 MG tablet Take 200 mg by mouth every 6 (six) hours as needed.    .      .      .       Family History  Problem Relation Age of Onset  . Colon cancer Neg Hx   . Colon polyps Neg Hx    Social History   Socioeconomic History  . Marital status: Single    Spouse name: Not on file  . Number of children: Not on file  . Years of education: Not on file  . Highest education level: Not on file  Occupational History  . Not on file  Social Needs  . Financial resource strain: Not on file  . Food insecurity:    Worry: Not on file    Inability: Not on file  . Transportation needs:    Medical: Not  on file    Non-medical: Not on file  Tobacco Use  . Smoking status: Current Every Day Smoker    Packs/day: 0.50    Types: Cigarettes  . Smokeless tobacco: Never Used  Substance and Sexual Activity  . Alcohol use: Yes    Comment: occasionally  . Drug use: Not Currently  . Sexual activity: Not on file  Lifestyle  . Physical activity:    Days per week: Not on file    Minutes per session: Not on file  . Stress: Not on file  Relationships  . Social connections:    Talks on phone: Not on file    Gets together: Not on file    Attends religious service: Not on file    Active member of club or organization: Not on file    Attends meetings of clubs or organizations: Not on file    Relationship status: Not on file  Other Topics Concern  . 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 ONE DAUGHTER, SINGLE. LIVES WITH GRANDFATHER(AGE 34)   Review of Systems PER HPI OTHERWISE ALL SYSTEMS ARE NEGATIVE.    Objective:   Physical Exam  Constitutional: She is oriented to person, place, and time. She appears well-developed and well-nourished. No  distress.  HENT:  Head: Normocephalic and atraumatic.  Mouth/Throat: Oropharynx is clear and moist. No oropharyngeal exudate.  Eyes: Pupils are equal, round, and reactive to light. No scleral icterus.  Neck: Normal range of motion. Neck supple.  Cardiovascular: Normal rate, regular rhythm and normal heart sounds.  Pulmonary/Chest: Effort normal and breath sounds normal. No respiratory distress.  Abdominal: Soft. Bowel sounds are normal. She exhibits no distension. There is no tenderness.  Musculoskeletal: She exhibits no edema.  Lymphadenopathy:    She has no cervical adenopathy.  Neurological: She is alert and oriented to person, place, and time.  NO  NEW FOCAL DEFICITS  Psychiatric:  FLAT AFFECT, SLIGHTLY ANXIOUS MOOD  Vitals reviewed.     Assessment & Plan:

## 2018-01-26 NOTE — Assessment & Plan Note (Signed)
SYMPTOMS NOT CONTROLLED AND MOST LIKELY DUE TO MENSES. NO BRBPR OR MELENA.  TO TREAT YOUR LOW BLOOD COUNT, TAKE IRON PILLS THREE TIMES A DAY. UPPER ENDOSCOPY TO EVALUATE GASTRIC ABNORMALITY SEEN ON CT. FOLLOW UP IN 4 MOS. CONSIDER TCS.

## 2018-01-26 NOTE — Progress Notes (Signed)
cc'ed to pcp °

## 2018-01-26 NOTE — Assessment & Plan Note (Addendum)
SYMPTOMS NOT CONTROLLED AND MOST LIKELY DUE TO PUD OR NSAID INDUCED GASTRITIS/DUODENITIS AND CONSTIPATION.  CT/LABS FROM SEP 2019 REVIEWED WITH PT. TO REDUCE CONSTIPATION:    1. DRINK WATER TO KEEP YOUR URINE LIGHT YELLOW.    2. FOLLOW A HIGH FIBER DIET. SEE INFO BELOW.    3. ADD LINZESS WITH YOUR FIRST MEAL. IT MAY CAUSE EXPLOSIVE DIARRHEA. TO TREAT GASTRITIS:    1. AVOID ITEMS THAT CAUSE GASTRITIS. SEE INFO BELOW.    2. CONTINUE Marty. TAKE 30 MINUTES PRIOR TO MEALS TWICE DAILY.    3. USE VISCOUS LIDOCAINE 2 TSP EVERY 4 HOURS WHEN NEEDED FOR FLARES OF HEARTBURN, CHEST PAIN, OR UPPER ABDOMINAL PAIN. USE A SYRINGE TO INJECT INTO THE BACK OF YOUR THROAT. USE NO MORE THAN 8 DOSES A DAY. IT WILL MAKE YOUR MOUTH, ESOPHAGUS, AND STOMACH NUMB COMPLETE UPPER ENDOSCOPY W/ MAC. DISCUSSED PROCEDURE, BENEFITS, & RISKS: < 1% chance of medication reaction, bleeding, OR perforation. NEEDS GASTRIC AND DUODENAL BIOPSIES.  FOLLOW UP IN 4 MOS.

## 2018-01-26 NOTE — Progress Notes (Signed)
 Subjective:    Patient ID: Angel Moore, female    DOB: 11/08/1980, 36 y.o.   MRN: [id number]  [facility]   HPI HAVING ABDOMINAL PAIN WORSE AT NIGHT FOR PAST YEAR. GETS RELIEF FROM BALLING UP AND PUTTING PILLOW ON HER STOMACH AND THEN IT STOPS. SHARP-STARTS IN RUQ AND MOVES TO HER CHEST. NO PMHx: GERD. USES BC AND IBUPROFEN. BC POWDERS: EVERY 2 WEEKS OR SO. IBUPROFEN: EVERY 2 WEEKS. USES THEM FOR HEADACHES. NO PPI OR ZANTAC/PEPCID. TOOK CARAFATE AND PROTONIX FOR 1 WEEK BUT IT DIDN'T WORK. BMs: CONSTIPATION, 2X/MO. TRIED: MOM, ENEMAS, DULCOLAX. STILL HAS MENSES EVERY MON, NL FLOW. NO RECREATIONAL DRUGS.  PT DENIES FEVER, CHILLS, HEMATOCHEZIA, HEMATEMESIS, nausea, vomiting, melena, diarrhea, CHEST PAIN, SHORTNESS OF BREATH,  CHANGE IN BOWEL IN HABITS, problems swallowing, problems with sedation, OR heartburn or indigestion.  Past Medical History:  Diagnosis Date  . Stress headaches   . Tobacco abuse    1 PK/WEEK   Past Surgical History:  Procedure Laterality Date  . CESAREAN SECTION  08/2007  . CHOLECYSTECTOMY, LAPAROSCOPIC  2006    No Known Allergies  Current Outpatient Medications  Medication Sig Dispense Refill  . Aspirin-Salicylamide-Caffeine (BC HEADACHE POWDER PO) Take 1 Package by mouth daily as needed (headache).    . ibuprofen (ADVIL,MOTRIN) 200 MG tablet Take 200 mg by mouth every 6 (six) hours as needed.    .      .      .       Family History  Problem Relation Age of Onset  . Colon cancer Neg Hx   . Colon polyps Neg Hx    Social History   Socioeconomic History  . Marital status: Single    Spouse name: Not on file  . Number of children: Not on file  . Years of education: Not on file  . Highest education level: Not on file  Occupational History  . Not on file  Social Needs  . Financial resource strain: Not on file  . Food insecurity:    Worry: Not on file    Inability: Not on file  . Transportation needs:    Medical: Not  on file    Non-medical: Not on file  Tobacco Use  . Smoking status: Current Every Day Smoker    Packs/day: 0.50    Types: Cigarettes  . Smokeless tobacco: Never Used  Substance and Sexual Activity  . Alcohol use: Yes    Comment: occasionally  . Drug use: Not Currently  . Sexual activity: Not on file  Lifestyle  . Physical activity:    Days per week: Not on file    Minutes per session: Not on file  . Stress: Not on file  Relationships  . Social connections:    Talks on phone: Not on file    Gets together: Not on file    Attends religious service: Not on file    Active member of club or organization: Not on file    Attends meetings of clubs or organizations: Not on file    Relationship status: Not on file  Other Topics Concern  . 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 ONE DAUGHTER, SINGLE. LIVES WITH GRANDFATHER(AGE 76)   Review of Systems PER HPI OTHERWISE ALL SYSTEMS ARE NEGATIVE.    Objective:   Physical Exam  Constitutional: She is oriented to person, place, and time. She appears well-developed and well-nourished. No   distress.  HENT:  Head: Normocephalic and atraumatic.  Mouth/Throat: Oropharynx is clear and moist. No oropharyngeal exudate.  Eyes: Pupils are equal, round, and reactive to light. No scleral icterus.  Neck: Normal range of motion. Neck supple.  Cardiovascular: Normal rate, regular rhythm and normal heart sounds.  Pulmonary/Chest: Effort normal and breath sounds normal. No respiratory distress.  Abdominal: Soft. Bowel sounds are normal. She exhibits no distension. There is no tenderness.  Musculoskeletal: She exhibits no edema.  Lymphadenopathy:    She has no cervical adenopathy.  Neurological: She is alert and oriented to person, place, and time.  NO  NEW FOCAL DEFICITS  Psychiatric:  FLAT AFFECT, SLIGHTLY ANXIOUS MOOD  Vitals reviewed.     Assessment & Plan:   

## 2018-01-27 NOTE — Patient Instructions (Signed)
Angel Moore  01/27/2018     @PREFPERIOPPHARMACY @   Your procedure is scheduled on 01/31/2018  [facility] at 7:00 A.M.  Call this number if you have problems the morning of surgery:  [phone]   Remember:  Do not eat or drink after midnight. (Follow instructions given to you by Dr. Darrick PennaFields' office)      Take these medicines the morning of surgery with A SIP OF WATER Carafate, Protonix (HOLD IRON)    Do not wear jewelry, make-up or nail polish.  Do not wear lotions, powders, or perfumes, or deodorant.  Do not shave 48 hours prior to surgery.  Men may shave face and neck.  Do not bring valuables to the hospital.  [facility] is not responsible for any belongings or valuables.  Contacts, dentures or bridgework may not be worn into surgery.  Leave your suitcase in the car.  After surgery it may be brought to your room.  For patients admitted to the hospital, discharge time will be determined by your treatment team.  Patients discharged the day of surgery will not be allowed to drive home.    Please read over the following fact sheets that you were given. Anesthesia Post-op Instructions     PATIENT INSTRUCTIONS POST-ANESTHESIA  IMMEDIATELY FOLLOWING SURGERY:  Do not drive or operate machinery for the first twenty four hours after surgery.  Do not make any important decisions for twenty four hours after surgery or while taking narcotic pain medications or sedatives.  If you develop intractable nausea and vomiting or a severe headache please notify your doctor immediately.  FOLLOW-UP:  Please make an appointment with your surgeon as instructed. You do not need to follow up with anesthesia unless specifically instructed to do so.  WOUND CARE INSTRUCTIONS (if applicable):  Keep a dry clean dressing on the anesthesia/puncture wound site if there is drainage.  Once the wound has quit draining you may leave it open to air.  Generally you should leave the bandage intact  for twenty four hours unless there is drainage.  If the epidural site drains for more than 36-48 hours please call the anesthesia department.  QUESTIONS?:  Please feel free to call your physician or the hospital operator if you have any questions, and they will be happy to assist you.      Esophagogastroduodenoscopy Esophagogastroduodenoscopy (EGD) is a procedure to examine the lining of the esophagus, stomach, and first part of the small intestine (duodenum). This procedure is done to check for problems such as inflammation, bleeding, ulcers, or growths. During this procedure, a long, flexible, lighted tube with a camera attached (endoscope) is inserted down the throat. Tell a health care provider about:  Any allergies you have.  All medicines you are taking, including vitamins, herbs, eye drops, creams, and over-the-counter medicines.  Any problems you or family members have had with anesthetic medicines.  Any blood disorders you have.  Any surgeries you have had.  Any medical conditions you have.  Whether you are pregnant or may be pregnant. What are the risks? Generally, this is a safe procedure. However, problems may occur, including:  Infection.  Bleeding.  A tear (perforation) in the esophagus, stomach, or duodenum.  Trouble breathing.  Excessive sweating.  Spasms of the larynx.  A slowed heartbeat.  Low blood pressure.  What happens before the procedure?  Follow instructions from your health care provider about eating or drinking restrictions.  Ask your health care provider about: ?  Changing or stopping your regular medicines. This is especially important if you are taking diabetes medicines or blood thinners. ? Taking medicines such as aspirin and ibuprofen. These medicines can thin your blood. Do not take these medicines before your procedure if your health care provider instructs you not to.  Plan to have someone take you home after the procedure.  If  you wear dentures, be ready to remove them before the procedure. What happens during the procedure?  To reduce your risk of infection, your health care team will wash or sanitize their hands.  An IV tube will be put in a vein in your hand or arm. You will get medicines and fluids through this tube.  You will be given one or more of the following: ? A medicine to help you relax (sedative). ? A medicine to numb the area (local anesthetic). This medicine may be sprayed into your throat. It will make you feel more comfortable and keep you from gagging or coughing during the procedure. ? A medicine for pain.  A mouth guard may be placed in your mouth to protect your teeth and to keep you from biting on the endoscope.  You will be asked to lie on your left side.  The endoscope will be lowered down your throat into your esophagus, stomach, and duodenum.  Air will be put into the endoscope. This will help your health care provider see better.  The lining of your esophagus, stomach, and duodenum will be examined.  Your health care provider may: ? Take a tissue sample so it can be looked at in a lab (biopsy). ? Remove growths. ? Remove objects (foreign bodies) that are stuck. ? Treat any bleeding with medicines or other devices that stop tissue from bleeding. ? Widen (dilate) or stretch narrowed areas of your esophagus and stomach.  The endoscope will be taken out. The procedure may vary among health care providers and hospitals. What happens after the procedure?  Your blood pressure, heart rate, breathing rate, and blood oxygen level will be monitored often until the medicines you were given have worn off.  Do not eat or drink anything until the numbing medicine has worn off and your gag reflex has returned. This information is not intended to replace advice given to you by your health care provider. Make sure you discuss any questions you have with your health care provider. Document  Released: 07/02/2004 Document Revised: 08/07/2015 Document Reviewed: 01/23/2015 Elsevier Interactive Patient Education  [employer].

## 2018-01-30 ENCOUNTER — Encounter (HOSPITAL_COMMUNITY): Payer: Self-pay

## 2018-01-30 ENCOUNTER — Encounter (HOSPITAL_COMMUNITY)
Admission: RE | Admit: 2018-01-30 | Discharge: 2018-01-30 | Disposition: A | Discharge status: Home / Self Care | Payer: Medicare Other | Source: Ambulatory Visit | Attending: Gastroenterology | Admitting: Gastroenterology

## 2018-01-30 ENCOUNTER — Other Ambulatory Visit: Payer: Self-pay

## 2018-01-30 DIAGNOSIS — Z7982 Long term (current) use of aspirin: Secondary | ICD-10-CM | POA: Diagnosis not present

## 2018-01-30 DIAGNOSIS — K5900 Constipation, unspecified: Secondary | ICD-10-CM | POA: Diagnosis not present

## 2018-01-30 DIAGNOSIS — K259 Gastric ulcer, unspecified as acute or chronic, without hemorrhage or perforation: Secondary | ICD-10-CM | POA: Diagnosis not present

## 2018-01-30 DIAGNOSIS — Z9049 Acquired absence of other specified parts of digestive tract: Secondary | ICD-10-CM | POA: Diagnosis not present

## 2018-01-30 DIAGNOSIS — Z01812 Encounter for preprocedural laboratory examination: Secondary | ICD-10-CM

## 2018-01-30 DIAGNOSIS — I10 Essential (primary) hypertension: Secondary | ICD-10-CM | POA: Diagnosis not present

## 2018-01-30 DIAGNOSIS — K2970 Gastritis, unspecified, without bleeding: Secondary | ICD-10-CM | POA: Diagnosis not present

## 2018-01-30 DIAGNOSIS — F17210 Nicotine dependence, cigarettes, uncomplicated: Secondary | ICD-10-CM | POA: Diagnosis not present

## 2018-01-30 DIAGNOSIS — D649 Anemia, unspecified: Secondary | ICD-10-CM | POA: Diagnosis not present

## 2018-01-30 DIAGNOSIS — Z791 Long term (current) use of non-steroidal anti-inflammatories (NSAID): Secondary | ICD-10-CM | POA: Diagnosis not present

## 2018-01-30 DIAGNOSIS — B9681 Helicobacter pylori [H. pylori] as the cause of diseases classified elsewhere: Secondary | ICD-10-CM | POA: Diagnosis not present

## 2018-01-30 DIAGNOSIS — R1013 Epigastric pain: Secondary | ICD-10-CM | POA: Diagnosis present

## 2018-01-30 HISTORY — DX: Essential (primary) hypertension: I10

## 2018-01-30 HISTORY — DX: Cardiac murmur, unspecified: R01.1

## 2018-01-30 HISTORY — DX: Anemia, unspecified: D64.9

## 2018-01-30 LAB — CBC WITH DIFFERENTIAL/PLATELET
ABS IMMATURE GRANULOCYTES: 0.03 10*3/uL (ref 0.00–0.07)
BASOS PCT: 1 %
Basophils Absolute: 0.1 10*3/uL (ref 0.0–0.1)
Eosinophils Absolute: 0.1 10*3/uL (ref 0.0–0.5)
Eosinophils Relative: 1 %
HEMATOCRIT: 32.1 % — AB (ref 36.0–46.0)
Hemoglobin: 8.8 g/dL — ABNORMAL LOW (ref 12.0–15.0)
Immature Granulocytes: 0 %
LYMPHS PCT: 28 %
Lymphs Abs: 2.7 10*3/uL (ref 0.7–4.0)
MCH: 18 pg — ABNORMAL LOW (ref 26.0–34.0)
MCHC: 27.4 g/dL — ABNORMAL LOW (ref 30.0–36.0)
MCV: 65.6 fL — AB (ref 80.0–100.0)
MONO ABS: 0.7 10*3/uL (ref 0.1–1.0)
MONOS PCT: 7 %
NEUTROS ABS: 5.9 10*3/uL (ref 1.7–7.7)
Neutrophils Relative %: 63 %
Platelets: 349 10*3/uL (ref 150–400)
RBC: 4.89 MIL/uL (ref 3.87–5.11)
RDW: 21.3 % — ABNORMAL HIGH (ref 11.5–15.5)
WBC: 9.4 10*3/uL (ref 4.0–10.5)
nRBC: 0 % (ref 0.0–0.2)

## 2018-01-30 LAB — HCG, QUANTITATIVE, PREGNANCY: hCG, Beta Chain, Quant, S: <1 m[IU]/mL (ref <5)

## 2018-01-30 LAB — FERRITIN: Ferritin: 3 ng/mL — ABNORMAL LOW (ref 11–307)

## 2018-01-30 NOTE — Progress Notes (Signed)
BP 150/108 or arrival for PAT testing.  Pt is scheduled for an EGD tomorrow and we will recheck it on arrival.  Anesthesia aware.

## 2018-01-31 ENCOUNTER — Ambulatory Visit (HOSPITAL_COMMUNITY): Payer: Medicare Other | Admitting: Anesthesiology

## 2018-01-31 ENCOUNTER — Ambulatory Visit (HOSPITAL_COMMUNITY)
Admission: RE | Admit: 2018-01-31 | Discharge: 2018-01-31 | Disposition: A | Discharge status: Home / Self Care | Payer: Medicare Other | Source: Ambulatory Visit | Attending: Gastroenterology | Admitting: Gastroenterology

## 2018-01-31 ENCOUNTER — Encounter (HOSPITAL_COMMUNITY): Payer: Self-pay | Admitting: Anesthesiology

## 2018-01-31 ENCOUNTER — Encounter (HOSPITAL_COMMUNITY)
Admission: RE | Disposition: A | Discharge status: Home / Self Care | Payer: Self-pay | Source: Ambulatory Visit | Attending: Gastroenterology

## 2018-01-31 DIAGNOSIS — R1013 Epigastric pain: Secondary | ICD-10-CM | POA: Diagnosis not present

## 2018-01-31 DIAGNOSIS — Z9049 Acquired absence of other specified parts of digestive tract: Secondary | ICD-10-CM | POA: Diagnosis not present

## 2018-01-31 DIAGNOSIS — D500 Iron deficiency anemia secondary to blood loss (chronic): Secondary | ICD-10-CM

## 2018-01-31 DIAGNOSIS — Z7982 Long term (current) use of aspirin: Secondary | ICD-10-CM | POA: Insufficient documentation

## 2018-01-31 DIAGNOSIS — K259 Gastric ulcer, unspecified as acute or chronic, without hemorrhage or perforation: Secondary | ICD-10-CM | POA: Insufficient documentation

## 2018-01-31 DIAGNOSIS — Z791 Long term (current) use of non-steroidal anti-inflammatories (NSAID): Secondary | ICD-10-CM | POA: Insufficient documentation

## 2018-01-31 DIAGNOSIS — F17210 Nicotine dependence, cigarettes, uncomplicated: Secondary | ICD-10-CM | POA: Insufficient documentation

## 2018-01-31 DIAGNOSIS — B9681 Helicobacter pylori [H. pylori] as the cause of diseases classified elsewhere: Secondary | ICD-10-CM | POA: Insufficient documentation

## 2018-01-31 DIAGNOSIS — K2970 Gastritis, unspecified, without bleeding: Secondary | ICD-10-CM | POA: Diagnosis not present

## 2018-01-31 DIAGNOSIS — I10 Essential (primary) hypertension: Secondary | ICD-10-CM | POA: Insufficient documentation

## 2018-01-31 DIAGNOSIS — K5900 Constipation, unspecified: Secondary | ICD-10-CM | POA: Insufficient documentation

## 2018-01-31 DIAGNOSIS — D649 Anemia, unspecified: Secondary | ICD-10-CM | POA: Insufficient documentation

## 2018-01-31 HISTORY — PX: BIOPSY: SHX5522

## 2018-01-31 HISTORY — PX: ESOPHAGOGASTRODUODENOSCOPY (EGD) WITH PROPOFOL: SHX5813

## 2018-01-31 SURGERY — ESOPHAGOGASTRODUODENOSCOPY (EGD) WITH PROPOFOL
Anesthesia: General

## 2018-01-31 MED ORDER — MIDAZOLAM HCL 2 MG/2ML IJ SOLN
INTRAMUSCULAR | Status: AC
  Filled 2018-01-31: qty 2

## 2018-01-31 MED ORDER — CHLORHEXIDINE GLUCONATE CLOTH 2 % EX PADS: 6.0000 | MEDICATED_PAD | Freq: Once | CUTANEOUS | Status: DC

## 2018-01-31 MED ORDER — MIDAZOLAM HCL 5 MG/5ML IJ SOLN
INTRAMUSCULAR | Status: DC | PRN
  Administered 2018-01-31: 2 mg via INTRAVENOUS

## 2018-01-31 MED ORDER — PROPOFOL 10 MG/ML IV BOLUS
INTRAVENOUS | Status: DC | PRN
  Administered 2018-01-31: 15 mg via INTRAVENOUS
  Administered 2018-01-31 (×2): 20 mg via INTRAVENOUS

## 2018-01-31 MED ORDER — STERILE WATER FOR IRRIGATION IR SOLN
Status: DC | PRN
  Administered 2018-01-31: 1.5 mL

## 2018-01-31 MED ORDER — LIDOCAINE VISCOUS HCL 2 % MT SOLN
OROMUCOSAL | Status: DC | PRN
  Administered 2018-01-31: 6 mL via OROMUCOSAL

## 2018-01-31 MED ORDER — MIDAZOLAM HCL 2 MG/2ML IJ SOLN: 0.5000 mg | Freq: Once | INTRAMUSCULAR | Status: DC | PRN

## 2018-01-31 MED ORDER — LABETALOL HCL 5 MG/ML IV SOLN
INTRAVENOUS | Status: AC
  Filled 2018-01-31: qty 4

## 2018-01-31 MED ORDER — LABETALOL HCL 5 MG/ML IV SOLN
INTRAVENOUS | Status: DC | PRN
  Administered 2018-01-31 (×2): 5 mg via INTRAVENOUS

## 2018-01-31 MED ORDER — LACTATED RINGERS IV SOLN
INTRAVENOUS | Status: DC
  Administered 2018-01-31: 08:00:00 via INTRAVENOUS

## 2018-01-31 MED ORDER — ONDANSETRON HCL 4 MG/2ML IJ SOLN
INTRAMUSCULAR | Status: DC | PRN
  Administered 2018-01-31: 4 mg via INTRAVENOUS

## 2018-01-31 MED ORDER — HYDROCODONE-ACETAMINOPHEN 7.5-325 MG PO TABS: 1.0000 | ORAL_TABLET | Freq: Once | ORAL | Status: DC | PRN

## 2018-01-31 MED ORDER — FERROUS SULFATE 325 (65 FE) MG PO TABS: 325.0000 mg | ORAL_TABLET | Freq: Three times a day (TID) | ORAL | 2 refills | Status: DC

## 2018-01-31 MED ORDER — PROPOFOL 500 MG/50ML IV EMUL
INTRAVENOUS | Status: DC | PRN
  Administered 2018-01-31: 125 ug/kg/min via INTRAVENOUS

## 2018-01-31 MED ORDER — LIDOCAINE VISCOUS HCL 2 % MT SOLN
OROMUCOSAL | Status: AC
  Filled 2018-01-31: qty 15

## 2018-01-31 MED ORDER — PROMETHAZINE HCL 25 MG/ML IJ SOLN: 6.2500 mg | INTRAMUSCULAR | Status: DC | PRN

## 2018-01-31 MED ORDER — HYDROMORPHONE HCL 1 MG/ML IJ SOLN: 0.2500 mg | INTRAMUSCULAR | Status: DC | PRN

## 2018-01-31 NOTE — Anesthesia Postprocedure Evaluation (Signed)
Anesthesia Post Note  Patient: Angel Moore  Procedure(s) Performed: ESOPHAGOGASTRODUODENOSCOPY (EGD) WITH PROPOFOL (N/A ) BIOPSY  Patient location during evaluation: PACU Anesthesia Type: General and MAC Level of consciousness: awake and patient cooperative Pain management: pain level controlled Vital Signs Assessment: post-procedure vital signs reviewed and stable Respiratory status: spontaneous breathing, nonlabored ventilation and respiratory function stable Cardiovascular status: blood pressure returned to baseline Postop Assessment: no apparent nausea or vomiting Anesthetic complications: no     Last Vitals:  Vitals:   01/31/18 0835 01/31/18 0840  BP: (!) 140/96   Pulse:    Resp: 19 18  Temp:    SpO2: 93% 95%    Last Pain:  Vitals:   01/31/18 0850  TempSrc:   PainSc: 0-No pain                 Shamarie Call J

## 2018-01-31 NOTE — Discharge Instructions (Signed)
Your abdominal pain and ULCERS/GASTRITIS/DUODENITIS are due to [facility] AND [facility]. YOUR LOW BLOOD COUNT AND IRON STORES ARE DUE TO ULCERS/GASTRITIS/DUODENITIS AND YOUR CYCLE. I biopsied your stomach AND SMALL BOWEL.   DRINK WATER TO KEEP YOUR URINE LIGHT YELLOW.  FOLLOW A LOW FAT DIET. MEATS SHOULD BE BAKED, BROILED, OR BOILED. AVOID FRIED FOOD. SEE INFO BELOW.   TO PREVENT ULCERS AND GASTRITIS DUE TO DAILY ASPIRIN/IBUPROFEN, TAKE THE PROTONIX. TAKE 30 MINUTES PRIOR TO MEALS TWICE DAILY FOR ONE MONTH THEN ONCE DAILY FOREVER.  TAKE IRON THREE TIMES DAILY FOR 3 MOS TO BUILD YOU BLOOD BACK UP.   YOUR BIOPSY RESULTS WILL BE BACK IN 5 BUSINESS DAYS.   REPEAT UPPER ENDOSCOPY IN 3 MOS.  FOLLOW UP IN 4 MOS.    UPPER ENDOSCOPY AFTER CARE Read the instructions outlined below and refer to this sheet in the next week. These discharge instructions provide you with general information on caring for yourself after you leave the hospital. While your treatment has been planned according to the most current medical practices available, unavoidable complications occasionally occur. If you have any problems or questions after discharge, call DR. Kwinton Maahs, [phone].  ACTIVITY  You may resume your regular activity, but move at a slower pace for the next 24 hours.   Take frequent rest periods for the next 24 hours.   Walking will help get rid of the air and reduce the bloated feeling in your belly (abdomen).   No driving for 24 hours (because of the medicine (anesthesia) used during the test).   You may shower.   Do not sign any important legal documents or operate any machinery for 24 hours (because of the anesthesia used during the test).    NUTRITION  Drink plenty of fluids.   You may resume your normal diet as instructed by your doctor.   Begin with a light meal and progress to your normal diet. Heavy or fried foods are harder to digest and may make you feel sick to your stomach  (nauseated).   Avoid alcoholic beverages for 24 hours or as instructed.    MEDICATIONS  You may resume your normal medications.   WHAT YOU CAN EXPECT TODAY  Some feelings of bloating in the abdomen.   Passage of more gas than usual.    IF YOU HAD A BIOPSY TAKEN DURING THE UPPER ENDOSCOPY:  No aspirin products for 14 days.   Eat a soft diet IF YOU HAVE NAUSEA, BLOATING, ABDOMINAL PAIN, OR VOMITING.    FINDING OUT THE RESULTS OF YOUR TEST Not all test results are available during your visit. DR. Darrick PennaFIELDS WILL CALL YOU WITHIN 7 DAYS OF YOUR PROCEDUE WITH YOUR RESULTS. Do not assume everything is normal if you have not heard from DR. Chason Mciver IN ONE WEEK, CALL HER OFFICE AT [phone].  SEEK IMMEDIATE MEDICAL ATTENTION AND CALL THE OFFICE: [phone] IF:  You have more than a spotting of blood in your stool.   Your belly is swollen (abdominal distention).   You are nauseated or vomiting.   You have a temperature over 101F.   You have abdominal pain or discomfort that is severe or gets worse throughout the day.  Ulcer Disease (Peptic Ulcer, Gastric Ulcer, Duodenal Ulcer) You have an ulcer. This may be in your stomach (gastric ulcer) or in the first part of your small bowel, the duodenum (duodenal ulcer). An ulcer is a break in the lining of the stomach or duodenum. The ulcer causes erosion into  the deeper tissue.  CAUSES The stomach has a lining to protect itself from the acid that digests food. The lining can be damaged in two main ways:  The Helico Pylori bacteria (H. Pyolori) can infect the lining of the stomach and cause ulcers.   ASPIRIN/Nonsteroidal, anti-inflammatory medications (NSAIDS) can cause gastric ulcerations.   Smoking tobacco can increase the acid in the stomach. This can lead to ulcers, and will impair healing of ulcers.   Other factors, such as alcohol use and stress may contribute to ulcer formation. Rarely, a tumor or cancer can cause an ulcer.    SYMPTOMS The problems (symptoms) of ulcer disease are usually a burning or gnawing of the mid-upper belly (abdomen). This is often worse on an empty stomach and may get better with food. This may be associated with feeling sick to your stomach (nausea), bloating, and vomiting. If the ulcer results in bleeding, it can cause:  Black, tarry stools.   Vomiting of bright red blood.   Vomiting coffee-ground-looking materials.   SEVERE ANEMIA   HOME CARE INSTRUCTIONS Continue regular work and usual activities unless advised otherwise by your caregiver.  Avoid tobacco, alcohol, and caffeine. Tobacco use will decrease and slow the rates of healing.   Avoid foods that seem to aggravate or cause discomfort.   There are many over-the-counter products available to control stomach acid and other symptoms.   Special diets are not usually needed.   Keep any follow-up appointments and blood tests, as directed.

## 2018-01-31 NOTE — Addendum Note (Signed)
Addendum  created 01/31/18 0925 by Despina HiddenIdacavage, Delara Shepheard J, CRNA   Intraprocedure Flowsheets edited

## 2018-01-31 NOTE — Op Note (Addendum)
[facility] Patient Name: Angel Moore Procedure Date: 01/31/2018 8:54 AM MRN: [id number] Date of Birth: June 23, 1980 Attending MD: Jonette Eva MD, MD CSN: [id number] Age: 37 Admit Type: Outpatient Procedure:                Upper GI endoscopy WITH COLD FORCEPS BIOPSY Indications:               Providers:                Jonette Eva MD, MD, Jannett Celestine, RN, Sterling Big, RN, Edythe Clarity, Technician Referring MD:             Alvina Filbert Medicines:                Propofol per Anesthesia Complications:            No immediate complications. Estimated Blood Loss:     Estimated blood loss was minimal. Procedure:                Pre-Anesthesia Assessment:                           - Prior to the procedure, a History and Physical                            was performed, and patient medications and                            allergies were reviewed. The patient's tolerance of                            previous anesthesia was also reviewed. The risks                            and benefits of the procedure and the sedation                            options and risks were discussed with the patient.                            All questions were answered, and informed consent                            was obtained. Prior Anticoagulants: The patient                            last took aspirin 14 days and ibuprofen 7 days                            prior to the procedure. ASA Grade Assessment: II -                            A patient with mild systemic disease. After  reviewing the risks and benefits, the patient was                            deemed in satisfactory condition to undergo the                            procedure. After obtaining informed consent, the                            endoscope was passed under direct vision.                            Throughout the procedure, the patient's blood   pressure, pulse, and oxygen saturations were                            monitored continuously. The GIF-H190 ([id number])                            scope was introduced through the mouth, and                            advanced to the second part of duodenum. The upper                            GI endoscopy was somewhat difficult due to the                            patient's agitation. Successful completion of the                            procedure was aided by increasing the dose of                            sedation medication. The patient tolerated the                            procedure fairly well. Scope In: 8:58:11 AM Scope Out: 9:06:59 AM Total Procedure Duration: 0 hours 8 minutes 48 seconds  Findings:      The examined esophagus was normal.      Diffuse moderate inflammation characterized by congestion (edema),       erosions, erythema and linear erosions was found in the entire examined       stomach. Biopsies(2: BODY, 3: ANTRUM) were taken with a cold forceps for       Helicobacter pylori testing.      Two non-bleeding cratered gastric ulcers with no stigmata of bleeding       were found at the pylorus.      Patchy mild inflammation characterized by congestion (edema) and       erythema was found in the duodenal bulb. Biopsies(2: BTL 2) for       histology were taken with a cold forceps for evaluation of celiac       disease.      The second portion of the duodenum was normal. Biopsies(4: BTL 1) for  histology were taken with a cold forceps for evaluation of celiac       disease. Impression:               - DYSPEPSIA/FEDA DUE TO EROSIVE GASTRITIS,                            DUODENITIS, AND PUD IN A MENSTRUATING FEMALE                           - . Moderate Sedation:      Per Anesthesia Care Recommendation:           - Patient has a contact number available for                            emergencies. The signs and symptoms of potential                             delayed complications were discussed with the                            patient. Return to normal activities tomorrow.                            Written discharge instructions were provided to the                            patient.                           - Low fat diet.                           - Continue present medications. IRON TID FOR 3 MOS.                            PROTONIX BID FOR ONE MO THEN ONCE DAILY.                           - Await pathology results.                           - Repeat upper endoscopy in 3 months for                            surveillance.                           - Return to GI office in 4 months. Procedure Code(s):        --- Professional ---                           [phone], Esophagogastroduodenoscopy, flexible,                            transoral; with biopsy, single or multiple Diagnosis Code(s):        ---  Professional ---                           K29.70, Gastritis, unspecified, without bleeding                           K25.9, Gastric ulcer, unspecified as acute or                            chronic, without hemorrhage or perforation                           K29.80, Duodenitis without bleeding CPT copyright 2018 American Medical Association. All rights reserved. The codes documented in this report are preliminary and upon coder review may  be revised to meet current compliance requirements. Jonette EvaSandi [name], MD Jonette EvaSandi [name] MD, MD 01/31/2018 9:15:10 AM This report has been signed electronically. Number of Addenda: 0

## 2018-01-31 NOTE — Interval H&P Note (Signed)
History and Physical Interval Note:  01/31/2018 7:26 AM  Angel Moore  has presented today for surgery, with the diagnosis of dyspepsia, anemia  The various methods of treatment have been discussed with the patient and family. After consideration of risks, benefits and other options for treatment, the patient has consented to  Procedure(s) with comments: ESOPHAGOGASTRODUODENOSCOPY (EGD) WITH PROPOFOL (N/A) - 7:30am as a surgical intervention .  The patient's history has been reviewed, patient examined, no change in status, stable for surgery.  I have reviewed the patient's chart and labs.  Questions were answered to the patient's satisfaction.     Eaton CorporationSandi Perri Aragones

## 2018-01-31 NOTE — Transfer of Care (Signed)
Immediate Anesthesia Transfer of Care Note  Patient: Angel Moore  Procedure(s) Performed: ESOPHAGOGASTRODUODENOSCOPY (EGD) WITH PROPOFOL (N/A ) BIOPSY  Patient Location: PACU  Anesthesia Type:MAC  Level of Consciousness: drowsy  Airway & Oxygen Therapy: Patient Spontanous Breathing and Patient connected to nasal cannula oxygen  Post-op Assessment: Report given to RN, Post -op Vital signs reviewed and stable and Patient moving all extremities  Post vital signs: Reviewed and stable  Last Vitals:  Vitals Value Taken Time  BP    Temp    Pulse    Resp    SpO2      Last Pain:  Vitals:   01/31/18 0850  TempSrc:   PainSc: 0-No pain      Patients Stated Pain Goal: 5 (31/43/88 8757)  Complications: No apparent anesthesia complications

## 2018-01-31 NOTE — Anesthesia Preprocedure Evaluation (Signed)
Anesthesia Evaluation  Patient identified by MRN, date of birth, ID band Patient awake    Reviewed: Allergy & Precautions, NPO status , Patient's Chart, lab work & pertinent test results  Airway Mallampati: I  TM Distance: >3 FB Neck ROM: Full    Dental no notable dental hx. (+) Teeth Intact   Pulmonary neg pulmonary ROS, Current Smoker,    Pulmonary exam normal breath sounds clear to auscultation       Cardiovascular Exercise Tolerance: Good hypertension, negative cardio ROS Normal cardiovascular examI Rhythm:Regular Rate:Normal  Pt with newly Dx'd HTN - currently without med  DBP ~ 90s-105s. D/w pt will probably treat this today for procedure and needs to follow up with primary MD for treatment. Pt with Anemia for w/u  Will proceed today with low risk procedure  Any intraop issues and d/w pt will abort as needed    Neuro/Psych negative neurological ROS  negative psych ROS   GI/Hepatic negative GI ROS, Neg liver ROS,   Endo/Other  negative endocrine ROS  Renal/GU negative Renal ROS  negative genitourinary   Musculoskeletal negative musculoskeletal ROS (+)   Abdominal   Peds negative pediatric ROS (+)  Hematology negative hematology ROS (+) anemia , Denies transfusions    Anesthesia Other Findings   Reproductive/Obstetrics negative OB ROS                             Anesthesia Physical Anesthesia Plan  ASA: II  Anesthesia Plan: General   Post-op Pain Management:    Induction: Intravenous  PONV Risk Score and Plan:   Airway Management Planned: Simple Face Mask and Nasal Cannula  Additional Equipment:   Intra-op Plan:   Post-operative Plan:   Informed Consent: I have reviewed the patients History and Physical, chart, labs and discussed the procedure including the risks, benefits and alternatives for the proposed anesthesia with the patient or authorized representative who  has indicated his/her understanding and acceptance.   Dental advisory given  Plan Discussed with: CRNA  Anesthesia Plan Comments:         Anesthesia Quick Evaluation

## 2018-02-01 ENCOUNTER — Telehealth: Payer: Self-pay | Admitting: Gastroenterology

## 2018-02-01 MED ORDER — CLARITHROMYCIN 500 MG PO TABS: ORAL_TABLET | ORAL | 0 refills | Status: DC

## 2018-02-01 MED ORDER — AMOXICILLIN 500 MG PO TABS: ORAL_TABLET | ORAL | 0 refills | Status: DC

## 2018-02-01 NOTE — Telephone Encounter (Signed)
PLEASE CALL PT. Her stomach Bx showed H. Pylori infection. She needs AMOXICILLIN 500 mg 2 po BID for 10 days and Biaxin 500 mg po bid for 10 days. She needs PROTONIX BID for 30 days then 1 po DAILY FOREVER Med side effects include NAUSEA, VOMITING, DIARRHEA, ABDOMINAL pain, and metallic taste.

## 2018-02-01 NOTE — Telephone Encounter (Signed)
Pt is aware.  

## 2018-02-06 ENCOUNTER — Encounter (HOSPITAL_COMMUNITY): Payer: Self-pay | Admitting: Gastroenterology

## 2018-03-16 ENCOUNTER — Encounter: Payer: Self-pay | Admitting: Gastroenterology

## 2018-05-29 ENCOUNTER — Encounter: Payer: Self-pay | Admitting: Gastroenterology

## 2018-05-29 ENCOUNTER — Ambulatory Visit: Payer: Medicare Other | Admitting: Gastroenterology

## 2018-05-29 ENCOUNTER — Telehealth: Payer: Self-pay | Admitting: Gastroenterology

## 2018-05-29 NOTE — Telephone Encounter (Signed)
PATIENT WAS A NO SHOW AND LETTER SENT  °

## 2019-01-15 ENCOUNTER — Emergency Department (HOSPITAL_COMMUNITY): Payer: Medicare Other

## 2019-01-15 ENCOUNTER — Encounter (HOSPITAL_COMMUNITY): Payer: Self-pay | Admitting: Emergency Medicine

## 2019-01-15 ENCOUNTER — Emergency Department (HOSPITAL_COMMUNITY)
Admission: EM | Admit: 2019-01-15 | Discharge: 2019-01-16 | Disposition: A | Discharge status: Home / Self Care | Payer: Medicare Other | Attending: Emergency Medicine | Admitting: Emergency Medicine

## 2019-01-15 ENCOUNTER — Other Ambulatory Visit: Payer: Self-pay

## 2019-01-15 DIAGNOSIS — F17210 Nicotine dependence, cigarettes, uncomplicated: Secondary | ICD-10-CM | POA: Insufficient documentation

## 2019-01-15 DIAGNOSIS — Z3A01 Less than 8 weeks gestation of pregnancy: Secondary | ICD-10-CM

## 2019-01-15 DIAGNOSIS — Z331 Pregnant state, incidental: Secondary | ICD-10-CM | POA: Insufficient documentation

## 2019-01-15 DIAGNOSIS — R1031 Right lower quadrant pain: Secondary | ICD-10-CM | POA: Insufficient documentation

## 2019-01-15 DIAGNOSIS — I10 Essential (primary) hypertension: Secondary | ICD-10-CM | POA: Diagnosis not present

## 2019-01-15 DIAGNOSIS — R1084 Generalized abdominal pain: Secondary | ICD-10-CM | POA: Diagnosis present

## 2019-01-15 LAB — COMPREHENSIVE METABOLIC PANEL
ALT: 12 U/L (ref 0–44)
AST: 19 U/L (ref 15–41)
Albumin: 3.7 g/dL (ref 3.5–5.0)
Alkaline Phosphatase: 74 U/L (ref 38–126)
Anion gap: 8 (ref 5–15)
BUN: 12 mg/dL (ref 6–20)
CO2: 21 mmol/L — ABNORMAL LOW (ref 22–32)
Calcium: 8.7 mg/dL — ABNORMAL LOW (ref 8.9–10.3)
Chloride: 109 mmol/L (ref 98–111)
Creatinine, Ser: 0.78 mg/dL (ref 0.44–1.00)
GFR calc Af Amer: >60 mL/min (ref >60)
GFR calc non Af Amer: >60 mL/min (ref >60)
Glucose, Bld: 95 mg/dL (ref 70–99)
Potassium: 3.5 mmol/L (ref 3.5–5.1)
Sodium: 138 mmol/L (ref 135–145)
Total Bilirubin: 0.2 mg/dL — ABNORMAL LOW (ref 0.3–1.2)
Total Protein: 7.1 g/dL (ref 6.5–8.1)

## 2019-01-15 LAB — URINALYSIS, ROUTINE W REFLEX MICROSCOPIC
Bilirubin Urine: NEGATIVE
Glucose, UA: NEGATIVE mg/dL
Hgb urine dipstick: NEGATIVE
Ketones, ur: NEGATIVE mg/dL
Leukocytes,Ua: NEGATIVE
Nitrite: NEGATIVE
Protein, ur: NEGATIVE mg/dL
Specific Gravity, Urine: 1.012 (ref 1.005–1.030)
pH: 6 (ref 5.0–8.0)

## 2019-01-15 LAB — CBC WITH DIFFERENTIAL/PLATELET
Abs Immature Granulocytes: 0.05 10*3/uL (ref 0.00–0.07)
Basophils Absolute: 0.1 10*3/uL (ref 0.0–0.1)
Basophils Relative: 1 %
Eosinophils Absolute: 0.2 10*3/uL (ref 0.0–0.5)
Eosinophils Relative: 1 %
HCT: 32.5 % — ABNORMAL LOW (ref 36.0–46.0)
Hemoglobin: 9.7 g/dL — ABNORMAL LOW (ref 12.0–15.0)
Immature Granulocytes: 0 %
Lymphocytes Relative: 27 %
Lymphs Abs: 3.3 10*3/uL (ref 0.7–4.0)
MCH: 20.5 pg — ABNORMAL LOW (ref 26.0–34.0)
MCHC: 29.8 g/dL — ABNORMAL LOW (ref 30.0–36.0)
MCV: 68.6 fL — ABNORMAL LOW (ref 80.0–100.0)
Monocytes Absolute: 1 10*3/uL (ref 0.1–1.0)
Monocytes Relative: 8 %
Neutro Abs: 7.8 10*3/uL — ABNORMAL HIGH (ref 1.7–7.7)
Neutrophils Relative %: 63 %
Platelets: 437 10*3/uL — ABNORMAL HIGH (ref 150–400)
RBC: 4.74 MIL/uL (ref 3.87–5.11)
RDW: 18.8 % — ABNORMAL HIGH (ref 11.5–15.5)
WBC: 12.4 10*3/uL — ABNORMAL HIGH (ref 4.0–10.5)
nRBC: 0 % (ref 0.0–0.2)

## 2019-01-15 LAB — HCG, QUANTITATIVE, PREGNANCY: hCG, Beta Chain, Quant, S: 4927 m[IU]/mL — ABNORMAL HIGH (ref <5)

## 2019-01-15 LAB — LIPASE, BLOOD: Lipase: 44 U/L (ref 11–51)

## 2019-01-15 LAB — PREGNANCY, URINE: Preg Test, Ur: POSITIVE — AB

## 2019-01-15 MED ORDER — SODIUM CHLORIDE 0.9 % IV SOLN
INTRAVENOUS | Status: DC
  Administered 2019-01-15: 21:00:00 via INTRAVENOUS

## 2019-01-15 NOTE — ED Triage Notes (Signed)
Pt c/o lower abd cramping x 2 days with nausea.

## 2019-01-15 NOTE — ED Provider Notes (Signed)
11:20 PM Assumed care from Dr. Sabra Heck, please see their note for full history, physical and decision making until this point. In brief this is a 38 y.o. year old female who presented to the ED tonight with Abdominal Pain     Rule out ectopic. Also slightly hypertensive, will reeval.   Discussed htn with her, will get rechecked. [place] with gestational sac but no e/o fetus. Plan for ob follow up for repeat hcg and [place], discussed with patient.   Discharge instructions, including strict return precautions for new or worsening symptoms, given. Patient and/or family verbalized understanding and agreement with the plan as described.   Labs, studies and imaging reviewed by myself and considered in medical decision making if ordered. Imaging interpreted by radiology.  Labs Reviewed  URINALYSIS, ROUTINE W REFLEX MICROSCOPIC - Abnormal; Notable for the following components:      Result Value   Color, Urine STRAW (*)    All other components within normal limits  PREGNANCY, URINE - Abnormal; Notable for the following components:   Preg Test, Ur POSITIVE (*)    All other components within normal limits  CBC WITH DIFFERENTIAL/PLATELET - Abnormal; Notable for the following components:   WBC 12.4 (*)    Hemoglobin 9.7 (*)    HCT 32.5 (*)    MCV 68.6 (*)    MCH 20.5 (*)    MCHC 29.8 (*)    RDW 18.8 (*)    Platelets 437 (*)    Neutro Abs 7.8 (*)    All other components within normal limits  COMPREHENSIVE METABOLIC PANEL - Abnormal; Notable for the following components:   CO2 21 (*)    Calcium 8.7 (*)    Total Bilirubin 0.2 (*)    All other components within normal limits  HCG, QUANTITATIVE, PREGNANCY - Abnormal; Notable for the following components:   hCG, Beta Chain, Quant, S 4,927 (*)    All other components within normal limits  LIPASE, BLOOD    US OB Transvaginal    (Results Pending)    No follow-ups on file.    Genavieve Mangiapane, Corene Cornea, MD 01/16/19 0330

## 2019-01-15 NOTE — ED Provider Notes (Signed)
[facility] EMERGENCY DEPARTMENT Provider Note   CSN: [id number] Arrival date & time: 01/15/19  1922     History   Chief Complaint Chief Complaint  Patient presents with  . Abdominal Pain    HPI Angel Moore is a 38 y.o. female.     This patient is a 39 year old female with a known history of prior cholecystectomy, prior cesarean section who presents with a complaint of abdominal pain which has been present for 2 days, has been crampy, intermittent but getting gradually worse and associated with some lower abdominal discomfort as well as some urinary frequency which the patient attributes to drinking more water the last couple of days.  She has been nauseated without vomiting or diarrhea, she is chronically constipated.  She is not had regular menstrual cycles here recently noting that she is not on a 4-week cycle.  She denies any radiation of the pain into the chest or to the back, she states it feels a little bit better when she takes a pillow and curls over it, she states that she can eat without difficulty, lay down without difficulty and has not had any medication for this.  She denies vaginal discharge, she is sexually active.   Abdominal Pain   Past Medical History:  Diagnosis Date  . Anemia   . Heart murmur   . Hypertension   . Stress headaches   . Tobacco abuse    1 PK/WEEK    Patient Active Problem List   Diagnosis Date Noted  . Dyspepsia 01/26/2018  . Iron deficiency anemia due to chronic blood loss 01/26/2018  . Constipation 01/26/2018    Past Surgical History:  Procedure Laterality Date  . BIOPSY  01/31/2018   Procedure: BIOPSY;  Surgeon: Danie Binder, MD;  Location: [facility] ENDO SUITE;  Service: Endoscopy;;  Gastric  . CESAREAN SECTION  08/2007  . CHOLECYSTECTOMY, LAPAROSCOPIC  2006  . ESOPHAGOGASTRODUODENOSCOPY (EGD) WITH PROPOFOL N/A 01/31/2018   Procedure: ESOPHAGOGASTRODUODENOSCOPY (EGD) WITH PROPOFOL;  Surgeon: Danie Binder, MD;  Location: [facility]  ENDO SUITE;  Service: Endoscopy;  Laterality: N/A;  7:30am     OB History   No obstetric history on file.      Home Medications    Prior to Admission medications   Medication Sig Start Date End Date Taking? Authorizing Provider  amoxicillin (AMOXIL) 500 MG tablet 2 PO BID FOR 10 DAYS 02/01/18   Fields, Marga Melnick, MD  Aspirin-Salicylamide-Caffeine (BC HEADACHE POWDER PO) Take 1 Package by mouth daily as needed (headache).    [provider]  clarithromycin (BIAXIN) 500 MG tablet 1 PO BID FOR 10 DAYS. 02/01/18   Fields, Marga Melnick, MD  ferrous sulfate 325 (65 FE) MG tablet Take 1 tablet (325 mg total) by mouth 3 (three) times daily with meals. For 3 mos 01/31/18   Danie Binder, MD  ibuprofen (ADVIL,MOTRIN) 200 MG tablet Take 400 mg by mouth every 6 (six) hours as needed for moderate pain.     [provider]  lidocaine (XYLOCAINE) 2 % solution Using a syringe, 10 mL PO q4h or 30 MINS PRIOR TO MEALS AND PRN FOR ABDOMINAL/CHEST PAIN. NO MORE> 8 DOSES/DAY. 01/26/18   Fields, Marga Melnick, MD  linaclotide (LINZESS) 290 MCG CAPS capsule 1 PO with first meal. IT MAY CAUSE DIARRHEA. 01/26/18   Fields, Marga Melnick, MD  pantoprazole (PROTONIX) 40 MG tablet 1 PO 30 MINUTES PRIOR TO MEALS BID FOR 3 MOS THEN QD 01/26/18   Fields, [employer]  L, MD    Family History Family History  Problem Relation Age of Onset  . Colon cancer Neg Hx   . Colon polyps Neg Hx     Social History Social History   Tobacco Use  . Smoking status: Current Every Day Smoker    Packs/day: 0.50    Types: Cigarettes  . Smokeless tobacco: Never Used  Substance Use Topics  . Alcohol use: Yes    Comment: occasionally  . Drug use: Not Currently     Allergies   Patient has no known allergies.   Review of Systems Review of Systems  Gastrointestinal: Positive for abdominal pain.  All other systems reviewed and are negative.    Physical Exam Updated Vital Signs BP (!) 178/120   Pulse (!) 102   Temp 97.8 F  (36.6 C)   Resp 20   Ht 1.575 m (5\' 2" )   Wt 95.3 kg   LMP 01/11/2019   SpO2 100%   BMI 38.41 kg/m   Physical Exam Vitals signs and nursing note reviewed.  Constitutional:      General: She is not in acute distress.    Appearance: She is well-developed.  HENT:     Head: Normocephalic and atraumatic.     Mouth/Throat:     Pharynx: No oropharyngeal exudate.  Eyes:     General: No scleral icterus.       Right eye: No discharge.        Left eye: No discharge.     Conjunctiva/sclera: Conjunctivae normal.     Pupils: Pupils are equal, round, and reactive to light.  Neck:     Musculoskeletal: Normal range of motion and neck supple.     Thyroid: No thyromegaly.     Vascular: No JVD.  Cardiovascular:     Rate and Rhythm: Normal rate and regular rhythm.     Heart sounds: Normal heart sounds. No murmur. No friction rub. No gallop.   Pulmonary:     Effort: Pulmonary effort is normal. No respiratory distress.     Breath sounds: Normal breath sounds. No wheezing or rales.  Abdominal:     General: Bowel sounds are normal. There is no distension.     Palpations: Abdomen is soft. There is no mass.     Tenderness: There is abdominal tenderness ( has RLQ and mid lower abdomen ttp without guarding).  Musculoskeletal: Normal range of motion.        General: No tenderness.     Right lower leg: No edema.     Left lower leg: No edema.  Lymphadenopathy:     Cervical: No cervical adenopathy.  Skin:    General: Skin is warm and dry.     Findings: No erythema or rash.  Neurological:     Mental Status: She is alert.     Coordination: Coordination normal.  Psychiatric:        Behavior: Behavior normal.      ED Treatments / Results  Labs (all labs ordered are listed, but only abnormal results are displayed) Labs Reviewed  URINALYSIS, ROUTINE W REFLEX MICROSCOPIC  PREGNANCY, URINE  CBC WITH DIFFERENTIAL/PLATELET  COMPREHENSIVE METABOLIC PANEL  LIPASE, BLOOD    EKG None   Radiology No results found.  Procedures Procedures (including critical care time)  Medications Ordered in ED Medications  0.9 %  sodium chloride infusion (has no administration in time range)     Initial Impression / Assessment and Plan / ED Course  I have reviewed  the triage vital signs and the nursing notes.  Pertinent labs & imaging results that were available during my care of the patient were reviewed by me and considered in my medical decision making (see chart for details).        #1 - Abd pain - r/o appy / UTI / cystitis - has no acute abdomen - well appearing otherwise without tachycardia or fever - check CBC and CT abdomen, r/o preg,   #2 - Has Htn - will need recheck   CT scan will be held due to positive pregnancy test. I have reexamined the patient's abdomen, she has no tenderness, I have done an ultrasound which shows no free fluid, no obvious intrauterine pregnancy hCG quant added on.  Change of shift - care singed out to Dr. Clayborne DanaMesner pending the ultrasound to r/o ectopic, VSS  Final Clinical Impressions(s) / ED Diagnoses   Final diagnoses:  None    ED Discharge Orders    None       Eber HongMiller, Glenmore Karl, MD 01/16/19 1546

## 2019-01-24 ENCOUNTER — Telehealth: Payer: Self-pay | Admitting: Obstetrics & Gynecology

## 2019-01-24 NOTE — Telephone Encounter (Signed)

## 2019-01-25 ENCOUNTER — Encounter: Payer: Self-pay | Admitting: Obstetrics & Gynecology

## 2019-01-25 ENCOUNTER — Other Ambulatory Visit: Payer: Self-pay | Admitting: Obstetrics & Gynecology

## 2019-01-25 ENCOUNTER — Other Ambulatory Visit: Payer: Self-pay

## 2019-01-25 ENCOUNTER — Other Ambulatory Visit (INDEPENDENT_AMBULATORY_CARE_PROVIDER_SITE_OTHER): Payer: Medicare Other

## 2019-01-25 ENCOUNTER — Ambulatory Visit (INDEPENDENT_AMBULATORY_CARE_PROVIDER_SITE_OTHER): Payer: Medicare Other | Admitting: Obstetrics & Gynecology

## 2019-01-25 VITALS — BP 121/85 | HR 92 | Ht 63.0 in | Wt 211.0 lb

## 2019-01-25 DIAGNOSIS — R109 Unspecified abdominal pain: Secondary | ICD-10-CM | POA: Diagnosis not present

## 2019-01-25 DIAGNOSIS — Z3490 Encounter for supervision of normal pregnancy, unspecified, unspecified trimester: Secondary | ICD-10-CM

## 2019-01-25 DIAGNOSIS — Z3A01 Less than 8 weeks gestation of pregnancy: Secondary | ICD-10-CM

## 2019-01-25 DIAGNOSIS — O3680X0 Pregnancy with inconclusive fetal viability, not applicable or unspecified: Secondary | ICD-10-CM

## 2019-01-25 DIAGNOSIS — O26891 Other specified pregnancy related conditions, first trimester: Secondary | ICD-10-CM

## 2019-01-25 MED ORDER — ONE-A-DAY WOMENS PRENATAL 1 28-0.8-235 MG PO CAPS: 1.0000 | ORAL_CAPSULE | Freq: Every day | ORAL | 11 refills | Status: DC

## 2019-01-25 NOTE — Progress Notes (Signed)
[place] 6+2 wks,single IUP w/ys,positive fht 116 bpm,normal ovaries,crl 5.96 mm

## 2019-01-25 NOTE — Progress Notes (Signed)
Follow up appointment for results  Chief Complaint  Patient presents with  . Follow-up ER    abdominal pain/ had U/S done    Blood pressure 121/85, pulse 92, height 5\' 3"  (1.6 m), weight 211 lb (95.7 kg), last menstrual period 12/12/2018.   [place] Ob Comp Less 14 Wks  Result Date: 01/25/2019 DATING AND VIABILITY SONOGRAM Angel Moore is a 38 y.o. year old G2P1 with LMP 12/12/2018 which would correlate to  7029w2d weeks gestation.  She has regular menstrual cycles.   She is here today for a confirmatory initial sonogram. GESTATION: SINGLETON   FETAL ACTIVITY:          Heart rate         116        CERVIX: Appears closed ADNEXA: The ovaries are normal. GESTATIONAL AGE AND  BIOMETRICS: Gestational criteria: Estimated Date of Delivery: 09/18/19 by LMP now at 3929w2d Previous Scans:1    CROWN RUMP LENGTH           5.96 mm         6+2 weeks                                                                      AVERAGE EGA(BY THIS SCAN):  6+2 weeks WORKING EDD( LMP ):  09/18/2019  TECHNICIAN COMMENTS: US 6+2 wks,single IUP w/ys,positive fht 116 bpm,normal ovaries,crl 5.96 mm A copy of this report including all images has been saved and backed up to a second source for retrieval if needed. All measures and details of the anatomical scan, placentation, fluid volume and pelvic anatomy are contained in that report. Angel Moore 01/25/2019 11:18 AM Clinical Impression and recommendations: I have reviewed the sonogram results above, combined with the patient's current clinical course, below are my impressions and any appropriate recommendations for management based on the sonographic findings. Viable early IUP G2P1 Estimated Date of Delivery: 09/18/19 by today's sonogram Normal general sonographic findings Recommend routine care unless otherwise clinically indicated Angel Moore 01/25/2019 11:24 AM  [place] Ob Transvaginal  Result Date: 01/16/2019 CLINICAL DATA:  Rule out ectopic EXAM: TRANSVAGINAL OB ULTRASOUND TECHNIQUE:  Transvaginal ultrasound was performed for complete evaluation of the gestation as well as the maternal uterus, adnexal regions, and pelvic cul-de-sac. COMPARISON:  CT abdomen pelvis 12/05/2017 FINDINGS: Intrauterine gestational sac: Single Yolk sac:  Not Visualized. Embryo:  Not Visualized. Cardiac Activity: Not Visualized. MSD: 5 mm   5 w   2 d Subchorionic hemorrhage: Small volume subchorionic hemorrhage is noted adjacent the gestational sac. Maternal uterus/adnexae: Hypoattenuating structure in the right ovary measuring 1.9 x 2.4 x 1.6 cm with peripheral color vascular flow compatible with a corpus luteum. Poor visualization of the left ovary due to bowel gas. Small nabothian cyst at the level of the cervix. Trace anechoic free fluid in the pelvis. IMPRESSION: Intrauterine gestational sac at the uterine fundus with small volume subchorionic hemorrhage. Estimated gestational age of [redacted] weeks 2 days by mean sac diameter measurement. Nonvisualization of a yolk sac or embryo may be secondary to early dates. Recommend short-term interval follow-up ultrasound in 7-10 days with serial quantitative beta hCG. Right adnexal corpus luteum. Nonvisualization of the left ovary. Electronically Signed   By: Samuella CotaPrice  [facility] M.D.   On: 01/16/2019 00:03     MEDS ordered this encounter: Meds ordered this encounter  Medications  . Prenat-Fe Carbonyl-FA-Omega 3 (ONE-A-DAY WOMENS PRENATAL 1) 28-0.8-235 MG CAPS    Sig: Take 1 tablet by mouth daily.    Dispense:  100 capsule    Refill:  11    Orders for this encounter: No orders of the defined types were placed in this encounter.   Impression: 1. Pregnancy at early stage Unsure of status prior to today's sonogram   Plan:   Follow Up: Return in about 3 weeks (around 02/15/2019) for NOB visit.       Face to face time:  10 minutes  Greater than 50% of the visit time was spent in counseling and coordination of care with the patient.  The summary and outline of  the counseling and care coordination is summarized in the note above.   All questions were answered.  Past Medical History:  Diagnosis Date  . Anemia   . Heart murmur   . Hypertension   . Stress headaches   . Tobacco abuse    1 PK/WEEK    Past Surgical History:  Procedure Laterality Date  . BIOPSY  01/31/2018   Procedure: BIOPSY;  Surgeon: Danie Binder, MD;  Location: [facility];  Service: Endoscopy;;  Gastric  . CESAREAN SECTION  08/2007  . CHOLECYSTECTOMY, LAPAROSCOPIC  2006  . ESOPHAGOGASTRODUODENOSCOPY (EGD) WITH PROPOFOL N/A 01/31/2018   Procedure: ESOPHAGOGASTRODUODENOSCOPY (EGD) WITH PROPOFOL;  Surgeon: Danie Binder, MD;  Location: [facility];  Service: Endoscopy;  Laterality: N/A;  7:30am    OB History    Gravida  2   Para  1   Term      Preterm      AB      Living  1     SAB      TAB      Ectopic      Multiple      Live Births  1           No Known Allergies  Social History   Socioeconomic History  . Marital status: Single    Spouse name: Not on file  . Number of children: 1  . Years of education: Not on file  . Highest education level: Not on file  Occupational History  . Not on file  Social Needs  . Financial resource strain: Not on file  . Food insecurity    Worry: Not on file    Inability: Not on file  . Transportation needs    Medical: Not on file    Non-medical: Not on file  Tobacco Use  . Smoking status: Current Every Day Smoker    Packs/day: 0.50    Types: Cigarettes  . Smokeless tobacco: Never Used  Substance and Sexual Activity  . Alcohol use: Not Currently    Comment: occasionally  . Drug use: Not Currently  . Sexual activity: Yes  Lifestyle  . Physical activity    Days per week: Not on file    Minutes per session: Not on file  . Stress: Not on file  Relationships  . Social Herbalist on phone: Not on file    Gets together: Not on file    Attends religious service: Not on file     Active member of club or organization: Not on file    Attends meetings of clubs or organizations: Not on file  Relationship status: Not on file  Other Topics Concern  . 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 ONE DAUGHTER, SINGLE. LIVES WITH GRANDFATHER(AGE 31)    Family History  Problem Relation Age of Onset  . Colon cancer Neg Hx   . Colon polyps Neg Hx

## 2019-03-01 ENCOUNTER — Ambulatory Visit: Payer: Medicare Other | Admitting: *Deleted

## 2019-03-01 ENCOUNTER — Encounter: Payer: Medicare Other | Admitting: Advanced Practice Midwife

## 2019-03-01 ENCOUNTER — Other Ambulatory Visit: Payer: Medicare Other

## 2019-03-12 ENCOUNTER — Ambulatory Visit: Payer: Medicare Other | Admitting: *Deleted

## 2019-03-12 ENCOUNTER — Other Ambulatory Visit: Payer: Self-pay | Admitting: Obstetrics & Gynecology

## 2019-03-12 ENCOUNTER — Telehealth: Payer: Self-pay | Admitting: Women's Health

## 2019-03-12 DIAGNOSIS — Z3682 Encounter for antenatal screening for nuchal translucency: Secondary | ICD-10-CM

## 2019-03-12 NOTE — Telephone Encounter (Signed)
Tried to reach the patient to remind her of her appointments/restrictions, mailbox is not set up/full.

## 2019-03-13 ENCOUNTER — Other Ambulatory Visit: Payer: Medicare Other

## 2019-03-13 ENCOUNTER — Ambulatory Visit: Payer: Medicare Other | Admitting: *Deleted

## 2019-03-13 ENCOUNTER — Encounter: Payer: Medicare Other | Admitting: Women's Health

## 2019-03-19 ENCOUNTER — Telehealth: Payer: Self-pay | Admitting: Advanced Practice Midwife

## 2019-03-19 NOTE — Telephone Encounter (Signed)
Called patient regarding appointment scheduled in our office and advised to come alone to the visit, however, a support person, over age 37, may accompany her to appointment if assistance is needed for safety or care concerns. Otherwise, support persons should remain outside until the visit is complete.   Prescreen questions asked: 1. Any of the following symptoms of COVID such as chills, fever, cough, shortness of breath, muscle pain, diarrhea, rash, vomiting, abdominal pain, red eye, weakness, bruising, bleeding, joint pain, loss of taste or smell, a severe headache, sore throat, fatigue 2. Any exposure to anyone suspected or confirmed of having COVID-19 3. Awaiting test results for COVID-19  Also,to keep you safe, please use the provided hand sanitizer when you enter the office. We are asking everyone in the office to wear a mask to help prevent the spread of germs. If you have a mask of your own, please wear it to your appointment, if not, we are happy to provide one for you.  Thank you for understanding and your cooperation.    [facility] Staff      

## 2019-03-20 ENCOUNTER — Other Ambulatory Visit: Payer: Self-pay

## 2019-03-20 ENCOUNTER — Ambulatory Visit (INDEPENDENT_AMBULATORY_CARE_PROVIDER_SITE_OTHER): Payer: Medicare Other | Admitting: Advanced Practice Midwife

## 2019-03-20 ENCOUNTER — Encounter: Payer: Self-pay | Admitting: Advanced Practice Midwife

## 2019-03-20 ENCOUNTER — Ambulatory Visit: Payer: Medicare Other | Admitting: *Deleted

## 2019-03-20 VITALS — BP 156/104 | HR 99 | Wt 211.0 lb

## 2019-03-20 DIAGNOSIS — Z113 Encounter for screening for infections with a predominantly sexual mode of transmission: Secondary | ICD-10-CM

## 2019-03-20 DIAGNOSIS — Z31430 Encounter of female for testing for genetic disease carrier status for procreative management: Secondary | ICD-10-CM

## 2019-03-20 DIAGNOSIS — Z1371 Encounter for nonprocreative screening for genetic disease carrier status: Secondary | ICD-10-CM

## 2019-03-20 DIAGNOSIS — O10919 Unspecified pre-existing hypertension complicating pregnancy, unspecified trimester: Secondary | ICD-10-CM | POA: Insufficient documentation

## 2019-03-20 DIAGNOSIS — F17200 Nicotine dependence, unspecified, uncomplicated: Secondary | ICD-10-CM | POA: Insufficient documentation

## 2019-03-20 DIAGNOSIS — O0992 Supervision of high risk pregnancy, unspecified, second trimester: Secondary | ICD-10-CM

## 2019-03-20 DIAGNOSIS — Z1389 Encounter for screening for other disorder: Secondary | ICD-10-CM

## 2019-03-20 DIAGNOSIS — Z3482 Encounter for supervision of other normal pregnancy, second trimester: Secondary | ICD-10-CM

## 2019-03-20 DIAGNOSIS — Z98891 History of uterine scar from previous surgery: Secondary | ICD-10-CM | POA: Insufficient documentation

## 2019-03-20 DIAGNOSIS — I10 Essential (primary) hypertension: Secondary | ICD-10-CM

## 2019-03-20 DIAGNOSIS — Z36 Encounter for antenatal screening for chromosomal anomalies: Secondary | ICD-10-CM

## 2019-03-20 DIAGNOSIS — Z331 Pregnant state, incidental: Secondary | ICD-10-CM

## 2019-03-20 DIAGNOSIS — Z3A14 14 weeks gestation of pregnancy: Secondary | ICD-10-CM

## 2019-03-20 DIAGNOSIS — Z1379 Encounter for other screening for genetic and chromosomal anomalies: Secondary | ICD-10-CM

## 2019-03-20 DIAGNOSIS — O0990 Supervision of high risk pregnancy, unspecified, unspecified trimester: Secondary | ICD-10-CM | POA: Insufficient documentation

## 2019-03-20 DIAGNOSIS — Z0283 Encounter for blood-alcohol and blood-drug test: Secondary | ICD-10-CM

## 2019-03-20 LAB — POCT URINALYSIS DIPSTICK OB
Blood, UA: NEGATIVE
Glucose, UA: NEGATIVE
Ketones, UA: NEGATIVE
Leukocytes, UA: NEGATIVE
Nitrite, UA: NEGATIVE
POC,PROTEIN,UA: NEGATIVE

## 2019-03-20 MED ORDER — ASPIRIN EC 81 MG PO TBEC: 162.0000 mg | DELAYED_RELEASE_TABLET | Freq: Every day | ORAL | 6 refills | Status: DC

## 2019-03-20 MED ORDER — LABETALOL HCL 200 MG PO TABS: 200.0000 mg | ORAL_TABLET | Freq: Two times a day (BID) | ORAL | 3 refills | Status: DC

## 2019-03-20 NOTE — Patient Instructions (Signed)
 First Trimester of Pregnancy The first trimester of pregnancy is from week 1 until the end of week 12 (months 1 through 3). A week after a sperm fertilizes an egg, the egg will implant on the wall of the uterus. This embryo will begin to develop into a baby. Genes from you and your partner are forming the baby. The female genes determine whether the baby is a boy or a girl. At 6-8 weeks, the eyes and face are formed, and the heartbeat can be seen on ultrasound. At the end of 12 weeks, all the baby's organs are formed.  Now that you are pregnant, you will want to do everything you can to have a healthy baby. Two of the most important things are to get good prenatal care and to follow your health care provider's instructions. Prenatal care is all the medical care you receive before the baby's birth. This care will help prevent, find, and treat any problems during the pregnancy and childbirth. BODY CHANGES Your body goes through many changes during pregnancy. The changes vary from woman to woman.   You may gain or lose a couple of pounds at first.  You may feel sick to your stomach (nauseous) and throw up (vomit). If the vomiting is uncontrollable, call your health care provider.  You may tire easily.  You may develop headaches that can be relieved by medicines approved by your health care provider.  You may urinate more often. Painful urination may mean you have a bladder infection.  You may develop heartburn as a result of your pregnancy.  You may develop constipation because certain hormones are causing the muscles that push waste through your intestines to slow down.  You may develop hemorrhoids or swollen, bulging veins (varicose veins).  Your breasts may begin to grow larger and become tender. Your nipples may stick out more, and the tissue that surrounds them (areola) may become darker.  Your gums may bleed and may be sensitive to brushing and flossing.  Dark spots or blotches  (chloasma, mask of pregnancy) may develop on your face. This will likely fade after the baby is born.  Your menstrual periods will stop.  You may have a loss of appetite.  You may develop cravings for certain kinds of food.  You may have changes in your emotions from day to day, such as being excited to be pregnant or being concerned that something may go wrong with the pregnancy and baby.  You may have more vivid and strange dreams.  You may have changes in your hair. These can include thickening of your hair, rapid growth, and changes in texture. Some women also have hair loss during or after pregnancy, or hair that feels dry or thin. Your hair will most likely return to normal after your baby is born. WHAT TO EXPECT AT YOUR PRENATAL VISITS During a routine prenatal visit:  You will be weighed to make sure you and the baby are growing normally.  Your blood pressure will be taken.  Your abdomen will be measured to track your baby's growth.  The fetal heartbeat will be listened to starting around week 10 or 12 of your pregnancy.  Test results from any previous visits will be discussed. Your health care provider may ask you:  How you are feeling.  If you are feeling the baby move.  If you have had any abnormal symptoms, such as leaking fluid, bleeding, severe headaches, or abdominal cramping.  If you have any questions. Other   tests that may be performed during your first trimester include:  Blood tests to find your blood type and to check for the presence of any previous infections. They will also be used to check for low iron levels (anemia) and Rh antibodies. Later in the pregnancy, blood tests for diabetes will be done along with other tests if problems develop.  Urine tests to check for infections, diabetes, or protein in the urine.  An ultrasound to confirm the proper growth and development of the baby.  An amniocentesis to check for possible genetic problems.  Fetal  screens for spina bifida and Down syndrome.  You may need other tests to make sure you and the baby are doing well. HOME CARE INSTRUCTIONS  Medicines  Follow your health care provider's instructions regarding medicine use. Specific medicines may be either safe or unsafe to take during pregnancy.  Take your prenatal vitamins as directed.  If you develop constipation, try taking a stool softener if your health care provider approves. Diet  Eat regular, well-balanced meals. Choose a variety of foods, such as meat or vegetable-based protein, fish, milk and low-fat dairy products, vegetables, fruits, and whole grain breads and cereals. Your health care provider will help you determine the amount of weight gain that is right for you.  Avoid raw meat and uncooked cheese. These carry germs that can cause birth defects in the baby.  Eating four or five small meals rather than three large meals a day may help relieve nausea and vomiting. If you start to feel nauseous, eating a few soda crackers can be helpful. Drinking liquids between meals instead of during meals also seems to help nausea and vomiting.  If you develop constipation, eat more high-fiber foods, such as fresh vegetables or fruit and whole grains. Drink enough fluids to keep your urine clear or pale yellow. Activity and Exercise  Exercise only as directed by your health care provider. Exercising will help you:  Control your weight.  Stay in shape.  Be prepared for labor and delivery.  Experiencing pain or cramping in the lower abdomen or low back is a good sign that you should stop exercising. Check with your health care provider before continuing normal exercises.  Try to avoid standing for long periods of time. Move your legs often if you must stand in one place for a long time.  Avoid heavy lifting.  Wear low-heeled shoes, and practice good posture.  You may continue to have sex unless your health care provider directs you  otherwise. Relief of Pain or Discomfort  Wear a good support bra for breast tenderness.   Take warm sitz baths to soothe any pain or discomfort caused by hemorrhoids. Use hemorrhoid cream if your health care provider approves.   Rest with your legs elevated if you have leg cramps or low back pain.  If you develop varicose veins in your legs, wear support hose. Elevate your feet for 15 minutes, 3-4 times a day. Limit salt in your diet. Prenatal Care  Schedule your prenatal visits by the twelfth week of pregnancy. They are usually scheduled monthly at first, then more often in the last 2 months before delivery.  Write down your questions. Take them to your prenatal visits.  Keep all your prenatal visits as directed by your health care provider. Safety  Wear your seat belt at all times when driving.  Make a list of emergency phone numbers, including numbers for family, friends, the hospital, and police and fire departments. General   Tips  Ask your health care provider for a referral to a local prenatal education class. Begin classes no later than at the beginning of month 6 of your pregnancy.  Ask for help if you have counseling or nutritional needs during pregnancy. Your health care provider can offer advice or refer you to specialists for help with various needs.  Do not use hot tubs, steam rooms, or saunas.  Do not douche or use tampons or scented sanitary pads.  Do not cross your legs for long periods of time.  Avoid cat litter boxes and soil used by cats. These carry germs that can cause birth defects in the baby and possibly loss of the fetus by miscarriage or stillbirth.  Avoid all smoking, herbs, alcohol, and medicines not prescribed by your health care provider. Chemicals in these affect the formation and growth of the baby.  Schedule a dentist appointment. At home, brush your teeth with a soft toothbrush and be gentle when you floss. SEEK MEDICAL CARE IF:   You have  dizziness.  You have mild pelvic cramps, pelvic pressure, or nagging pain in the abdominal area.  You have persistent nausea, vomiting, or diarrhea.  You have a bad smelling vaginal discharge.  You have pain with urination.  You notice increased swelling in your face, hands, legs, or ankles. SEEK IMMEDIATE MEDICAL CARE IF:   You have a fever.  You are leaking fluid from your vagina.  You have spotting or bleeding from your vagina.  You have severe abdominal cramping or pain.  You have rapid weight gain or loss.  You vomit blood or material that looks like coffee grounds.  You are exposed to German measles and have never had them.  You are exposed to fifth disease or chickenpox.  You develop a severe headache.  You have shortness of breath.  You have any kind of trauma, such as from a fall or a car accident. Document Released: 02/23/2001 Document Revised: 07/16/2013 Document Reviewed: 01/09/2013 Exit[employer] Patient Information 2015 [employer]. This information is not intended to replace advice given to you by your health care provider. Make sure you discuss any questions you have with your health care provider.   Nausea & Vomiting  Have saltine crackers or pretzels by your bed and eat a few bites before you raise your head out of bed in the morning  Eat small frequent meals throughout the day instead of large meals  Drink plenty of fluids throughout the day to stay hydrated, just don't drink a lot of fluids with your meals.  This can make your stomach fill up faster making you feel sick  Do not brush your teeth right after you eat  Products with real ginger are good for nausea, like ginger ale and ginger hard candy Make sure it says made with real ginger!  Sucking on sour candy like lemon heads is also good for nausea  If your prenatal vitamins make you nauseated, take them at night so you will sleep through the nausea  Sea Bands  If you feel like you need  medicine for the nausea & vomiting please let us know  If you are unable to keep any fluids or food down please let us know   Constipation  Drink plenty of fluid, preferably water, throughout the day  Eat foods high in fiber such as fruits, vegetables, and grains  Exercise, such as walking, is a good way to keep your bowels regular  Drink warm fluids, especially warm   prune juice, or decaf coffee  Eat a 1/2 cup of real oatmeal (not instant), 1/2 cup applesauce, and 1/2-1 cup warm prune juice every day  If needed, you may take Colace (docusate sodium) stool softener once or twice a day to help keep the stool soft. If you are pregnant, wait until you are out of your first trimester (12-14 weeks of pregnancy)  If you still are having problems with constipation, you may take Miralax once daily as needed to help keep your bowels regular.  If you are pregnant, wait until you are out of your first trimester (12-14 weeks of pregnancy)  Safe Medications in Pregnancy   Acne: Benzoyl Peroxide Salicylic Acid  Backache/Headache: Tylenol: 2 regular strength every 4 hours OR              2 Extra strength every 6 hours  Colds/Coughs/Allergies: Benadryl (alcohol free) 25 mg every 6 hours as needed Breath right strips Claritin Cepacol throat lozenges Chloraseptic throat spray Cold-Eeze- up to three times per day Cough drops, alcohol free Flonase (by prescription only) Guaifenesin Mucinex Robitussin DM (plain only, alcohol free) Saline nasal spray/drops Sudafed (pseudoephedrine) & Actifed ** use only after [redacted] weeks gestation and if you do not have high blood pressure Tylenol Vicks Vaporub Zinc lozenges Zyrtec   Constipation: Colace Ducolax suppositories Fleet enema Glycerin suppositories Metamucil Milk of magnesia Miralax Senokot Smooth move tea  Diarrhea: Kaopectate Imodium A-D  *NO pepto Bismol  Hemorrhoids: Anusol Anusol HC Preparation  H Tucks  Indigestion: Tums Maalox Mylanta Zantac  Pepcid  Insomnia: Benadryl (alcohol free) 25mg every 6 hours as needed Tylenol PM Unisom, no Gelcaps  Leg Cramps: Tums MagGel  Nausea/Vomiting:  Bonine Dramamine Emetrol Ginger extract Sea bands Meclizine  Nausea medication to take during pregnancy:  Unisom (doxylamine succinate 25 mg tablets) Take one tablet daily at bedtime. If symptoms are not adequately controlled, the dose can be increased to a maximum recommended dose of two tablets daily (1/2 tablet in the morning, 1/2 tablet mid-afternoon and one at bedtime). Vitamin B6 100mg tablets. Take one tablet twice a day (up to 200 mg per day).  Skin Rashes: Aveeno products Benadryl cream or 25mg every 6 hours as needed Calamine Lotion 1% cortisone cream  Yeast infection: Gyne-lotrimin 7 Monistat 7   **If taking multiple medications, please check labels to avoid duplicating the same active ingredients **take medication as directed on the label ** Do not exceed 4000 mg of tylenol in 24 hours **Do not take medications that contain aspirin or ibuprofen      

## 2019-03-20 NOTE — Progress Notes (Signed)
INITIAL OBSTETRICAL VISIT Patient name: Angel Moore MRN [id number]  Date of birth: 02/08/1981 Chief Complaint:   Initial Prenatal Visit  History of Present Illness:   Angel Moore is a 39 y.o. G59P1001 African American female at [redacted]w[redacted]d by LMP/US with an Estimated Date of Delivery: 09/18/19 being seen today for her initial obstetrical visit.   Her obstetrical history is significant for CHTN, preeclampsia, CS for "baby's head was swelling at 5cms, so went for emergency cs" (sounds like failed IOL). Had been on HTN meds in the past, none now. .   Today she reports no complaints.  Patient's last menstrual period was 12/12/2018.  Review of Systems:   Pertinent items are noted in HPI Denies cramping/contractions, leakage of fluid, vaginal bleeding, abnormal vaginal discharge w/ itching/odor/irritation, headaches, visual changes, shortness of breath, chest pain, abdominal pain, severe nausea/vomiting, or problems with urination or bowel movements unless otherwise stated above.  Pertinent History Reviewed:  Reviewed past medical,surgical, social, obstetrical and family history.  Reviewed problem list, medications and allergies. OB History  Gravida Para Term Preterm AB Living  2 1 1     1   SAB TAB Ectopic Multiple Live Births          1    # Outcome Date GA Lbr Len/2nd Weight Sex Delivery Anes PTL Lv  2 Current           1 Term 08/23/07 [redacted]w[redacted]d  6 lb 12 oz (3.062 kg) F CS-LTranv EPI N LIV     Complications: Failure to Progress in First Stage, Pre-eclampsia   Physical Assessment:   Vitals:   03/20/19 1500  BP: (!) 156/104  Pulse: 99  Weight: 211 lb (95.7 kg)  Body mass index is 37.38 kg/m.       Physical Examination:  General appearance - well appearing, and in no distress  Mental status - alert, oriented to person, place, and time  Psych:  She has a normal mood and affect  Skin - warm and dry, normal color, no suspicious lesions noted  Chest - effort normal, all lung fields clear to  auscultation bilaterally  Heart - normal rate and regular rhythm  Abdomen - soft, nontender  Extremities:  No swelling or varicosities noted    Fetal Heart Rate (bpm): 151 via doppler  Results for orders placed or performed in visit on 03/20/19 (from the past 24 hour(s))  POC Urinalysis Dipstick OB   Collection Time: 03/20/19  2:50 PM  Result Value Ref Range   Color, UA     Clarity, UA     Glucose, UA Negative Negative   Bilirubin, UA     Ketones, UA neg    Spec Grav, UA     Blood, UA neg    pH, UA     POC,PROTEIN,UA Negative Negative, Trace, Small (1+), Moderate (2+), Large (3+), 4+   Urobilinogen, UA     Nitrite, UA neg    Leukocytes, UA Negative Negative   Appearance     Odor      Assessment & Plan:  1) High-Risk Pregnancy G2P1001 at [redacted]w[redacted]d with an Estimated Date of Delivery: 09/18/19   2) Initial OB visit  3) CHTN: start labetalol 200mg  BID, ASA 162mg  and Babys[employer]s blood pressure cuff  Meds:  Meds ordered this encounter  Medications  . labetalol (NORMODYNE) 200 MG tablet    Sig: Take 1 tablet (200 mg total) by mouth 2 (two) times daily.    Dispense:  60 tablet  Refill:  3    Order Specific Question:   Supervising Provider    Answer:   Despina Hidden, LUTHER H [[id number]]  . aspirin EC 81 MG tablet    Sig: Take 2 tablets (162 mg total) by mouth daily.    Dispense:  60 tablet    Refill:  6    Order Specific Question:   Supervising Provider    Answer:   Lazaro Arms [[id number]]    Initial labs obtained Continue prenatal vitamins Reviewed n/v relief measures and warning s/s to report Reviewed recommended weight gain based on pre-gravid BMI Encouraged well-balanced diet Watched video for carrier screening/genetic testing:  Genetic Screening discussed First Screen: requested Cystic fibrosis screening declined SMA screening declined Fragile X screening declined Ultrasound discussed; fetal survey: requested CCNC completed  Follow-up: Return in about 4 weeks (around  04/17/2019) for IT:[id number]; blood pressure check in 2 weeks (virtual).   Orders Placed This Encounter  Procedures  . Urine Culture  . GC/Chlamydia Probe Amp  . Obstetric Panel, Including HIV  . MaterniT 21 plus Core, Blood  . Hemoglobinopathy evaluation  . Pain Management Screening Profile (10S)  . POC Urinalysis Dipstick OB    Jacklyn Shell DNP, CNM 03/20/2019 4:59 PM

## 2019-03-21 LAB — PMP SCREEN PROFILE (10S), URINE
Amphetamine Scrn, Ur: NEGATIVE ng/mL
BARBITURATE SCREEN URINE: NEGATIVE ng/mL
BENZODIAZEPINE SCREEN, URINE: NEGATIVE ng/mL
CANNABINOIDS UR QL SCN: NEGATIVE ng/mL
Cocaine (Metab) Scrn, Ur: NEGATIVE ng/mL
Creatinine(Crt), U: 76 mg/dL (ref 20.0–300.0)
Methadone Screen, Urine: NEGATIVE ng/mL
OXYCODONE+OXYMORPHONE UR QL SCN: NEGATIVE ng/mL
Opiate Scrn, Ur: NEGATIVE ng/mL
Ph of Urine: 5.8 (ref 4.5–8.9)
Phencyclidine Qn, Ur: NEGATIVE ng/mL
Propoxyphene Scrn, Ur: NEGATIVE ng/mL

## 2019-03-22 ENCOUNTER — Other Ambulatory Visit: Payer: Self-pay | Admitting: Advanced Practice Midwife

## 2019-03-22 LAB — GC/CHLAMYDIA PROBE AMP
Chlamydia trachomatis, NAA: NEGATIVE
Neisseria Gonorrhoeae by PCR: NEGATIVE

## 2019-03-22 LAB — URINE CULTURE

## 2019-03-22 NOTE — Progress Notes (Signed)
feso4 for anemia

## 2019-03-23 ENCOUNTER — Encounter: Payer: Self-pay | Admitting: Advanced Practice Midwife

## 2019-03-26 ENCOUNTER — Encounter: Payer: Self-pay | Admitting: Advanced Practice Midwife

## 2019-03-26 DIAGNOSIS — D573 Sickle-cell trait: Secondary | ICD-10-CM | POA: Insufficient documentation

## 2019-03-26 LAB — MATERNIT 21 PLUS CORE, BLOOD
Fetal Fraction: 5
Result (T21): NEGATIVE
Trisomy 13 (Patau syndrome): NEGATIVE
Trisomy 18 (Edwards syndrome): NEGATIVE
Trisomy 21 (Down syndrome): NEGATIVE

## 2019-03-26 LAB — OBSTETRIC PANEL, INCLUDING HIV
Antibody Screen: NEGATIVE
Basophils Absolute: 0 10*3/uL (ref 0.0–0.2)
Basos: 0 %
EOS (ABSOLUTE): 0.2 10*3/uL (ref 0.0–0.4)
Eos: 2 %
HIV Screen 4th Generation wRfx: NONREACTIVE
Hematocrit: 32.8 % — ABNORMAL LOW (ref 34.0–46.6)
Hemoglobin: 9.9 g/dL — ABNORMAL LOW (ref 11.1–15.9)
Hepatitis B Surface Ag: NEGATIVE
Immature Grans (Abs): 0 10*3/uL (ref 0.0–0.1)
Immature Granulocytes: 0 %
Lymphocytes Absolute: 2.5 10*3/uL (ref 0.7–3.1)
Lymphs: 21 %
MCH: 21.6 pg — ABNORMAL LOW (ref 26.6–33.0)
MCHC: 30.2 g/dL — ABNORMAL LOW (ref 31.5–35.7)
MCV: 72 fL — ABNORMAL LOW (ref 79–97)
Monocytes Absolute: 0.9 10*3/uL (ref 0.1–0.9)
Monocytes: 8 %
Neutrophils Absolute: 8 10*3/uL — ABNORMAL HIGH (ref 1.4–7.0)
Neutrophils: 69 %
Platelets: 415 10*3/uL (ref 150–450)
RBC: 4.58 x10E6/uL (ref 3.77–5.28)
RDW: 21.3 % — ABNORMAL HIGH (ref 11.7–15.4)
RPR Ser Ql: NONREACTIVE
Rh Factor: POSITIVE
Rubella Antibodies, IGG: 2.5 index (ref >0.99)
WBC: 11.7 10*3/uL — ABNORMAL HIGH (ref 3.4–10.8)

## 2019-03-26 LAB — HEMOGLOBINOPATHY EVALUATION
HGB C: 0 %
HGB S: 35.3 % — ABNORMAL HIGH
HGB VARIANT: 0 %
Hemoglobin A2 Quantitation: 3.7 % — ABNORMAL HIGH (ref 1.8–3.2)
Hemoglobin F Quantitation: 0 % (ref 0.0–2.0)
Hgb A: 61 % — ABNORMAL LOW (ref 96.4–98.8)

## 2019-04-03 ENCOUNTER — Telehealth (INDEPENDENT_AMBULATORY_CARE_PROVIDER_SITE_OTHER): Payer: Medicare Other | Admitting: *Deleted

## 2019-04-03 ENCOUNTER — Other Ambulatory Visit: Payer: Self-pay

## 2019-04-03 VITALS — BP 136/92

## 2019-04-03 DIAGNOSIS — Z0130 Encounter for examination of blood pressure without abnormal findings: Secondary | ICD-10-CM

## 2019-04-03 DIAGNOSIS — I10 Essential (primary) hypertension: Secondary | ICD-10-CM

## 2019-04-03 DIAGNOSIS — O0992 Supervision of high risk pregnancy, unspecified, second trimester: Secondary | ICD-10-CM

## 2019-04-03 DIAGNOSIS — D573 Sickle-cell trait: Secondary | ICD-10-CM

## 2019-04-03 NOTE — Progress Notes (Signed)
   I connected with  Angel Moore on 04/03/19 by a video enabled telemedicine application and verified that I am speaking with the correct person using two identifiers.   I discussed the limitations of evaluation and management by telemedicine. The patient expressed understanding and agreed to proceed.   NURSE VISIT- BLOOD PRESSURE CHECK  SUBJECTIVE:  Angel Moore is a 39 y.o. G57P1001 female here for BP check. She is [redacted]w[redacted]d pregnant    HYPERTENSION ROS:  Pregnant: Severe headaches that don't go away with tylenol/other medicines: No  . Visual changes (seeing spots/double/blurred vision) No  . Severe pain under right breast breast or in center of upper chest No  . Severe nausea/vomiting No  . Taking medicines as instructed yes   OBJECTIVE:  LMP 12/12/2018   Appearance oriented to person, place, and time.  ASSESSMENT: Pregnancy [redacted]w[redacted]d  blood pressure check  PLAN: Discussed with Dr. Despina Hidden   Recommendations: no changes needed   Follow-up: as scheduled   Jobe Marker  04/03/2019 10:53 AM

## 2019-04-17 ENCOUNTER — Other Ambulatory Visit: Payer: Self-pay | Admitting: Advanced Practice Midwife

## 2019-04-17 DIAGNOSIS — Z363 Encounter for antenatal screening for malformations: Secondary | ICD-10-CM

## 2019-04-18 ENCOUNTER — Other Ambulatory Visit: Payer: Self-pay

## 2019-04-18 ENCOUNTER — Ambulatory Visit (INDEPENDENT_AMBULATORY_CARE_PROVIDER_SITE_OTHER): Payer: Medicare Other

## 2019-04-18 ENCOUNTER — Ambulatory Visit (INDEPENDENT_AMBULATORY_CARE_PROVIDER_SITE_OTHER): Payer: Medicare Other | Admitting: Advanced Practice Midwife

## 2019-04-18 VITALS — BP 133/82 | HR 80 | Wt 212.4 lb

## 2019-04-18 DIAGNOSIS — Z3A18 18 weeks gestation of pregnancy: Secondary | ICD-10-CM

## 2019-04-18 DIAGNOSIS — Z1379 Encounter for other screening for genetic and chromosomal anomalies: Secondary | ICD-10-CM

## 2019-04-18 DIAGNOSIS — O34219 Maternal care for unspecified type scar from previous cesarean delivery: Secondary | ICD-10-CM

## 2019-04-18 DIAGNOSIS — Z363 Encounter for antenatal screening for malformations: Secondary | ICD-10-CM | POA: Diagnosis not present

## 2019-04-18 DIAGNOSIS — D573 Sickle-cell trait: Secondary | ICD-10-CM

## 2019-04-18 DIAGNOSIS — Z98891 History of uterine scar from previous surgery: Secondary | ICD-10-CM

## 2019-04-18 DIAGNOSIS — O10012 Pre-existing essential hypertension complicating pregnancy, second trimester: Secondary | ICD-10-CM

## 2019-04-18 DIAGNOSIS — O0992 Supervision of high risk pregnancy, unspecified, second trimester: Secondary | ICD-10-CM

## 2019-04-18 DIAGNOSIS — I10 Essential (primary) hypertension: Secondary | ICD-10-CM

## 2019-04-18 DIAGNOSIS — O283 Abnormal ultrasonic finding on antenatal screening of mother: Secondary | ICD-10-CM

## 2019-04-18 NOTE — Progress Notes (Signed)
[place] 18+1 wks,cephalic,posterior placenta gr 0,cx 3.5 cm,svp of fluid 5.5 cm,fhr 150 bpm,LVEICF 2.4 mm,limited view of heart because of pt body habitus and fetal position,please have pt come back for addition images,efw 237 g 59%

## 2019-04-18 NOTE — Progress Notes (Signed)
   HIGH-RISK PREGNANCY VISIT Patient name: Angel Moore MRN [id number]  Date of birth: 1981/01/19 Chief Complaint:   Routine Prenatal Visit  History of Present Illness:   Angel Moore is a 39 y.o. G22P1001 female at [redacted]w[redacted]d with an Estimated Date of Delivery: 09/18/19 being seen today for ongoing management of a high-risk pregnancy notable for cHTN on Labetalol 200mg  bid; hx pre-e; hx C/S planning repeat  Today she reports no complaints. Contractions: Not present. Vag. Bleeding: None.  Movement: Present. denies leaking of fluid. Review of Systems:   Pertinent items are noted in HPI Denies abnormal vaginal discharge w/ itching/odor/irritation, headaches, visual changes, shortness of breath, chest pain, abdominal pain, severe nausea/vomiting, or problems with urination or bowel movements unless otherwise stated above. Pertinent History Reviewed:  Reviewed past medical,surgical, social, obstetrical and family history.  Reviewed problem list, medications and allergies. Physical Assessment:   Vitals:   04/18/19 1608  BP: 133/82  Pulse: 80  Weight: 212 lb 6.4 oz (96.3 kg)  Body mass index is 37.62 kg/m.        Physical Examination:   General appearance: Well appearing, and in no distress  Mental status: Alert, oriented to person, place, and time  Skin: Warm & dry  Cardiovascular: Normal heart rate noted  Respiratory: Normal respiratory effort, no distress  Abdomen: Soft, gravid, nontender  Pelvic: Cervical exam deferred         Extremities: Edema: None  Fetal Status: Fetal Heart Rate (bpm): 150 u/s   Movement: Present     Anatomy U/S: 06/16/19 18+1 wks,cephalic,posterior placenta gr 0,cx 3.5 cm,svp of fluid 5.5 cm,fhr 150 bpm,LVEICF 2.4 mm,limited view of heart because of pt body habitus and fetal position,please have pt come back for addition images,efw 237 g 59%  No results found for this or any previous visit (from the past 24 hour(s)).  Assessment & Plan:  1) High-risk pregnancy  G2P1001 at [redacted]w[redacted]d with an Estimated Date of Delivery: 09/18/19   2) cHTN, on Labetalol 200mg  bid, ASA; urine P/C not sent last time- will resend  3) Previous C/S, plans repeat and considering BTL  4) LVEIF and limited heart views, will repeat U/S at next visit   Meds: No orders of the defined types were placed in this encounter.  Labs/procedures today: anatomy U/S; AFP; P/C ratio  Plan:  Continue routine obstetrical care   Reviewed: Preterm labor symptoms and general obstetric precautions including but not limited to vaginal bleeding, contractions, leaking of fluid and fetal movement were reviewed in detail with the patient.  All questions were answered. Has home bp cuff. Check bp weekly, let 11/19/19 know if >140/90.   Follow-up: Return in about 4 weeks (around 05/16/2019) for LROB, in person, [place]: OB F/U heart views.  Orders Placed This Encounter  Procedures  . 07/16/2019 OB Follow Up  . AFP TETRA   [place] CNM 04/18/2019 4:21 PM

## 2019-04-18 NOTE — Patient Instructions (Signed)
Angel Moore, I greatly value your feedback.  If you receive a survey following your visit with [place] today, we appreciate you taking the time to fill it out.  Thanks, Angel Moore CNM  [facility] HAS MOVED!!! It is now [facility] ([address] [place], [place] [address]) Entrance located off of [address] Free 24/7 valet parking   Go to [email or web address] to register for FREE online childbirth classes  [place] Pediatricians/Family Doctors:  [facility] [phone]            [facility] [phone]                 [facility] [phone] (usually not accepting new patients unless you have family there already, you are always welcome to call and ask)       [facility] Department [phone]       [facility] Pediatricians/Family Doctors:   [facility]: [phone]  [facility]: [phone]  [facility]: [phone]  [facility] Doctors:   [facility]: [phone]   [facility]: [phone]  [facility] Doctors:  [facility]: [phone]    Home Blood Pressure Monitoring for Patients   Your provider has recommended that you check your blood pressure (BP) at least once a week at home. If you do not have a blood pressure cuff at home, one will be provided for you. Contact your provider if you have not received your monitor within 1 week.   Helpful Tips for Accurate Home Blood Pressure Checks  . Don't smoke, exercise, or drink caffeine 30 minutes before checking your BP . Use the restroom before checking your BP (a full bladder can raise your pressure) . Relax in a comfortable upright chair . Feet on the ground . Left arm resting comfortably on a flat surface at the level of your heart . Legs uncrossed . Back supported . Sit quietly and don't talk . Place the cuff on your bare  arm . Adjust snuggly, so that only two fingertips can fit between your skin and the top of the cuff . Check 2 readings separated by at least one minute . Keep a log of your BP readings . For a visual, please reference this diagram: http://ccnc.care/bpdiagram  Provider Name: [facility]     Phone: [phone]  Zone 1: ALL CLEAR  Continue to monitor your symptoms:  . BP reading is less than 140 (top number) or less than 90 (bottom number)  . No right upper stomach pain . No headaches or seeing spots . No feeling nauseated or throwing up . No swelling in face and hands  Zone 2: CAUTION Call your doctor's office for any of the following:  . BP reading is greater than 140 (top number) or greater than 90 (bottom number)  . Stomach pain under your ribs in the middle or right side . Headaches or seeing spots . Feeling nauseated or throwing up . Swelling in face and hands  Zone 3: EMERGENCY  Seek immediate medical care if you have any of the following:  . BP reading is greater than160 (top number) or greater than 110 (bottom number) . Severe headaches not improving with Tylenol . Serious difficulty catching your breath . Any worsening symptoms from Zone 2     Second Trimester of Pregnancy The second trimester is from week 14 through week 27 (months 4 through 6). The second trimester is often  a time when you feel your best. Your body has adjusted to being pregnant, and you begin to feel better physically. Usually, morning sickness has lessened or quit completely, you may have more energy, and you may have an increase in appetite. The second trimester is also a time when the fetus is growing rapidly. At the end of the sixth month, the fetus is about 9 inches long and weighs about 1 pounds. You will likely begin to feel the baby move (quickening) between 16 and 20 weeks of pregnancy. Body changes during your second trimester Your body continues to go through many changes during your  second trimester. The changes vary from woman to woman.  Your weight will continue to increase. You will notice your lower abdomen bulging out.  You may begin to get stretch marks on your hips, abdomen, and breasts.  You may develop headaches that can be relieved by medicines. The medicines should be approved by your health care provider.  You may urinate more often because the fetus is pressing on your bladder.  You may develop or continue to have heartburn as a result of your pregnancy.  You may develop constipation because certain hormones are causing the muscles that push waste through your intestines to slow down.  You may develop hemorrhoids or swollen, bulging veins (varicose veins).  You may have back pain. This is caused by: ? Weight gain. ? Pregnancy hormones that are relaxing the joints in your pelvis. ? A shift in weight and the muscles that support your balance.  Your breasts will continue to grow and they will continue to become tender.  Your gums may bleed and may be sensitive to brushing and flossing.  Dark spots or blotches (chloasma, mask of pregnancy) may develop on your face. This will likely fade after the baby is born.  A dark line from your belly button to the pubic area (linea nigra) may appear. This will likely fade after the baby is born.  You may have changes in your hair. These can include thickening of your hair, rapid growth, and changes in texture. Some women also have hair loss during or after pregnancy, or hair that feels dry or thin. Your hair will most likely return to normal after your baby is born.  What to expect at prenatal visits During a routine prenatal visit:  You will be weighed to make sure you and the fetus are growing normally.  Your blood pressure will be taken.  Your abdomen will be measured to track your baby's growth.  The fetal heartbeat will be listened to.  Any test results from the previous visit will be  discussed.  Your health care provider may ask you:  How you are feeling.  If you are feeling the baby move.  If you have had any abnormal symptoms, such as leaking fluid, bleeding, severe headaches, or abdominal cramping.  If you are using any tobacco products, including cigarettes, chewing tobacco, and electronic cigarettes.  If you have any questions.  Other tests that may be performed during your second trimester include:  Blood tests that check for: ? Low iron levels (anemia). ? High blood sugar that affects pregnant women (gestational diabetes) between 16 and 28 weeks. ? Rh antibodies. This is to check for a protein on red blood cells (Rh factor).  Urine tests to check for infections, diabetes, or protein in the urine.  An ultrasound to confirm the proper growth and development of the baby.  An amniocentesis to check  for possible genetic problems.  Fetal screens for spina bifida and Down syndrome.  HIV (human immunodeficiency virus) testing. Routine prenatal testing includes screening for HIV, unless you choose not to have this test.  Follow these instructions at home: Medicines  Follow your health care provider's instructions regarding medicine use. Specific medicines may be either safe or unsafe to take during pregnancy.  Take a prenatal vitamin that contains at least 600 micrograms (mcg) of folic acid.  If you develop constipation, try taking a stool softener if your health care provider approves. Eating and drinking  Eat a balanced diet that includes fresh fruits and vegetables, whole grains, good sources of protein such as meat, eggs, or tofu, and low-fat dairy. Your health care provider will help you determine the amount of weight gain that is right for you.  Avoid raw meat and uncooked cheese. These carry germs that can cause birth defects in the baby.  If you have low calcium intake from food, talk to your health care provider about whether you should take a  daily calcium supplement.  Limit foods that are high in fat and processed sugars, such as fried and sweet foods.  To prevent constipation: ? Drink enough fluid to keep your urine clear or pale yellow. ? Eat foods that are high in fiber, such as fresh fruits and vegetables, whole grains, and beans. Activity  Exercise only as directed by your health care provider. Most women can continue their usual exercise routine during pregnancy. Try to exercise for 30 minutes at least 5 days a week. Stop exercising if you experience uterine contractions.  Avoid heavy lifting, wear low heel shoes, and practice good posture.  A sexual relationship may be continued unless your health care provider directs you otherwise. Relieving pain and discomfort  Wear a good support bra to prevent discomfort from breast tenderness.  Take warm sitz baths to soothe any pain or discomfort caused by hemorrhoids. Use hemorrhoid cream if your health care provider approves.  Rest with your legs elevated if you have leg cramps or low back pain.  If you develop varicose veins, wear support hose. Elevate your feet for 15 minutes, 3-4 times a day. Limit salt in your diet. Prenatal Care  Write down your questions. Take them to your prenatal visits.  Keep all your prenatal visits as told by your health care provider. This is important. Safety  Wear your seat belt at all times when driving.  Make a list of emergency phone numbers, including numbers for family, friends, the hospital, and police and fire departments. General instructions  Ask your health care provider for a referral to a local prenatal education class. Begin classes no later than the beginning of month 6 of your pregnancy.  Ask for help if you have counseling or nutritional needs during pregnancy. Your health care provider can offer advice or refer you to specialists for help with various needs.  Do not use hot tubs, steam rooms, or saunas.  Do not  douche or use tampons or scented sanitary pads.  Do not cross your legs for long periods of time.  Avoid cat litter boxes and soil used by cats. These carry germs that can cause birth defects in the baby and possibly loss of the fetus by miscarriage or stillbirth.  Avoid all smoking, herbs, alcohol, and unprescribed drugs. Chemicals in these products can affect the formation and growth of the baby.  Do not use any products that contain nicotine or tobacco, such as cigarettes  and e-cigarettes. If you need help quitting, ask your health care provider.  Visit your dentist if you have not gone yet during your pregnancy. Use a soft toothbrush to brush your teeth and be gentle when you floss. Contact a health care provider if:  You have dizziness.  You have mild pelvic cramps, pelvic pressure, or nagging pain in the abdominal area.  You have persistent nausea, vomiting, or diarrhea.  You have a bad smelling vaginal discharge.  You have pain when you urinate. Get help right away if:  You have a fever.  You are leaking fluid from your vagina.  You have spotting or bleeding from your vagina.  You have severe abdominal cramping or pain.  You have rapid weight gain or weight loss.  You have shortness of breath with chest pain.  You notice sudden or extreme swelling of your face, hands, ankles, feet, or legs.  You have not felt your baby move in over an hour.  You have severe headaches that do not go away when you take medicine.  You have vision changes. Summary  The second trimester is from week 14 through week 27 (months 4 through 6). It is also a time when the fetus is growing rapidly.  Your body goes through many changes during pregnancy. The changes vary from woman to woman.  Avoid all smoking, herbs, alcohol, and unprescribed drugs. These chemicals affect the formation and growth your baby.  Do not use any tobacco products, such as cigarettes, chewing tobacco, and  e-cigarettes. If you need help quitting, ask your health care provider.  Contact your health care provider if you have any questions. Keep all prenatal visits as told by your health care provider. This is important. This information is not intended to replace advice given to you by your health care provider. Make sure you discuss any questions you have with your health care provider. Document Released: 02/23/2001 Document Revised: 08/07/2015 Document Reviewed: 05/02/2012 Elsevier Interactive Patient Education  2017 [place] FLU! Because you are pregnant, we at [facility], along with the Centers for Disease Control (CDC), recommend that you receive the flu vaccine to protect yourself and your baby from the flu. The flu is more likely to cause severe illness in pregnant women than in women of reproductive age who are not pregnant. Changes in the immune system, heart, and lungs during pregnancy make pregnant women (and women up to two weeks postpartum) more prone to severe illness from flu, including illness resulting in hospitalization. Flu also may be harmful for a pregnant woman's developing baby. A common flu symptom is fever, which may be associated with neural tube defects and other adverse outcomes for a developing baby. Getting vaccinated can also help protect a baby after birth from flu. (Mom passes antibodies onto the developing baby during her pregnancy.)  A Flu Vaccine is the Best Protection Against Flu Getting a flu vaccine is the first and most important step in protecting against flu. Pregnant women should get a flu shot and not the live attenuated influenza vaccine (LAIV), also known as nasal spray flu vaccine. Flu vaccines given during pregnancy help protect both the mother and her baby from flu. Vaccination has been shown to reduce the risk of flu-associated acute respiratory infection in pregnant women by up to one-half. A 2018 study showed  that getting a flu shot reduced a pregnant woman's risk of being hospitalized with flu by an average of 40  percent. Pregnant women who get a flu vaccine are also helping to protect their babies from flu illness for the first several months after their birth, when they are too young to get vaccinated.   A Long Record of Safety for Flu Shots in Pregnant Women Flu shots have been given to millions of pregnant women over many years with a good safety record. There is a lot of evidence that flu vaccines can be given safely during pregnancy; though these data are limited for the first trimester. The CDC recommends that pregnant women get vaccinated during any trimester of their pregnancy. It is very important for pregnant women to get the flu shot.   Other Preventive Actions In addition to getting a flu shot, pregnant women should take the same everyday preventive actions the CDC recommends of everyone, including covering coughs, washing hands often, and avoiding people who are sick.  Symptoms and Treatment If you get sick with flu symptoms call your doctor right away. There are antiviral drugs that can treat flu illness and prevent serious flu complications. The CDC recommends prompt treatment for people who have influenza infection or suspected influenza infection and who are at high risk of serious flu complications, such as people with asthma, diabetes (including gestational diabetes), or heart disease. Early treatment of influenza in hospitalized pregnant women has been shown to reduce the length of the hospital stay.  Symptoms Flu symptoms include fever, cough, sore throat, runny or stuffy nose, body aches, headache, chills and fatigue. Some people may also have vomiting and diarrhea. People may be infected with the flu and have respiratory symptoms without a fever.  Early Treatment is Important for Pregnant Women Treatment should begin as soon as possible because antiviral drugs work best when  started early (within 48 hours after symptoms start). Antiviral drugs can make your flu illness milder and make you feel better faster. They may also prevent serious health problems that can result from flu illness. Oral oseltamivir (Tamiflu) is the preferred treatment for pregnant women because it has the most studies available to suggest that it is safe and beneficial. Antiviral drugs require a prescription from your provider. Having a fever caused by flu infection or other infections early in pregnancy may be linked to birth defects in a baby. In addition to taking antiviral drugs, pregnant women who get a fever should treat their fever with Tylenol (acetaminophen) and contact their provider immediately.  When to Seek Emergency Medical Care If you are pregnant and have any of these signs, seek care immediately:  Difficulty breathing or shortness of breath  Pain or pressure in the chest or abdomen  Sudden dizziness  Confusion  Severe or persistent vomiting  High fever that is not responding to Tylenol (or store brand equivalent)  Decreased or no movement of your baby  [email or web address]

## 2019-04-19 LAB — PROTEIN / CREATININE RATIO, URINE
Creatinine, Urine: 105.7 mg/dL
Protein, Ur: 11 mg/dL
Protein/Creat Ratio: 104 mg/g creat (ref 0–200)

## 2019-04-19 LAB — SPECIMEN STATUS REPORT

## 2019-04-20 LAB — AFP TETRA
DIA Mom Value: 1.08
DIA Value (EIA): 194.98 pg/mL
DSR (By Age)    1 IN: 212
DSR (Second Trimester) 1 IN: 1887
Gestational Age: 21 WEEKS
MSAFP Mom: 0.39
MSAFP: 22.4 ng/mL
MSHCG Mom: 0.52
MSHCG: 9756 m[IU]/mL
Maternal Age At EDD: 36.5 yr
Osb Risk: 10000
T18 (By Age): 1:827 {titer}
Test Results:: POSITIVE — AB
Tri 18 Risk At Term: 1:12 {titer}
Weight: 212 [lb_av]
uE3 Mom: 0.41
uE3 Value: 1.06 ng/mL

## 2019-05-09 ENCOUNTER — Telehealth: Payer: Self-pay | Admitting: *Deleted

## 2019-05-09 DIAGNOSIS — O280 Abnormal hematological finding on antenatal screening of mother: Secondary | ICD-10-CM

## 2019-05-09 NOTE — Telephone Encounter (Signed)
Pt left message that she saw a message in mychart from Dr. Despina Hidden that said something is wrong with her baby and she needs a referral to MFM for genetic counseling.

## 2019-05-09 NOTE — Telephone Encounter (Signed)
Pt scheduled for genetic counseling on March 8 at 230 pm with MFM.

## 2019-05-16 ENCOUNTER — Telehealth: Payer: Self-pay | Admitting: *Deleted

## 2019-05-16 ENCOUNTER — Ambulatory Visit (INDEPENDENT_AMBULATORY_CARE_PROVIDER_SITE_OTHER): Payer: Medicare Other | Admitting: Women's Health

## 2019-05-16 ENCOUNTER — Other Ambulatory Visit: Payer: Self-pay

## 2019-05-16 ENCOUNTER — Ambulatory Visit: Payer: Medicare Other

## 2019-05-16 ENCOUNTER — Encounter: Payer: Self-pay | Admitting: Women's Health

## 2019-05-16 VITALS — BP 153/102 | HR 87 | Wt 217.6 lb

## 2019-05-16 DIAGNOSIS — Z1389 Encounter for screening for other disorder: Secondary | ICD-10-CM

## 2019-05-16 DIAGNOSIS — D573 Sickle-cell trait: Secondary | ICD-10-CM

## 2019-05-16 DIAGNOSIS — O99012 Anemia complicating pregnancy, second trimester: Secondary | ICD-10-CM

## 2019-05-16 DIAGNOSIS — O285 Abnormal chromosomal and genetic finding on antenatal screening of mother: Secondary | ICD-10-CM

## 2019-05-16 DIAGNOSIS — O0992 Supervision of high risk pregnancy, unspecified, second trimester: Secondary | ICD-10-CM

## 2019-05-16 DIAGNOSIS — I10 Essential (primary) hypertension: Secondary | ICD-10-CM

## 2019-05-16 DIAGNOSIS — O0990 Supervision of high risk pregnancy, unspecified, unspecified trimester: Secondary | ICD-10-CM

## 2019-05-16 DIAGNOSIS — O10919 Unspecified pre-existing hypertension complicating pregnancy, unspecified trimester: Secondary | ICD-10-CM

## 2019-05-16 DIAGNOSIS — Z331 Pregnant state, incidental: Secondary | ICD-10-CM

## 2019-05-16 DIAGNOSIS — O10012 Pre-existing essential hypertension complicating pregnancy, second trimester: Secondary | ICD-10-CM

## 2019-05-16 DIAGNOSIS — Z3A22 22 weeks gestation of pregnancy: Secondary | ICD-10-CM

## 2019-05-16 LAB — POCT URINALYSIS DIPSTICK OB
Glucose, UA: NEGATIVE
Ketones, UA: NEGATIVE
Nitrite, UA: NEGATIVE
POC,PROTEIN,UA: NEGATIVE

## 2019-05-16 MED ORDER — LABETALOL HCL 300 MG PO TABS: 300.0000 mg | ORAL_TABLET | Freq: Two times a day (BID) | ORAL | 6 refills | Status: DC

## 2019-05-16 NOTE — Telephone Encounter (Signed)
Order placed by [name]. [name] informed.

## 2019-05-16 NOTE — Patient Instructions (Signed)
Angel Moore, I greatly value your feedback.  If you receive a survey following your visit with [place] today, we appreciate you taking the time to fill it out.  Thanks, [name], CNM, [facility]  [facility] HOSPITAL HAS MOVED!!! It is now [facility] ([address] [place], [place] [address]) Entrance located off of [address] Free 24/7 valet parking   Go to [email or web address] to register for FREE online childbirth classes  [place] Pediatricians/Family Doctors:  [facility] [phone]            [facility] [phone]                 [facility] [phone] (usually not accepting new patients unless you have family there already, you are always welcome to call and ask)       [facility] Department [phone]       [facility] Pediatricians/Family Doctors:   [facility]: [phone]  [facility]: [phone]  [facility]: [phone]  [facility] Doctors:   [facility]: [phone]   [facility]: [phone]  [facility] Doctors:  [facility]: [phone]    Home Blood Pressure Monitoring for Patients   Your provider has recommended that you check your blood pressure (BP) at least once a week at home. If you do not have a blood pressure cuff at home, one will be provided for you. Contact your provider if you have not received your monitor within 1 week.   Helpful Tips for Accurate Home Blood Pressure Checks  . Don't smoke, exercise, or drink caffeine 30 minutes before checking your BP . Use the restroom before checking your BP (a full bladder can raise your pressure) . Relax in a comfortable upright chair . Feet on the ground . Left arm resting comfortably on a flat surface at the level of your heart . Legs uncrossed . Back supported . Sit quietly and don't talk . Place the cuff  on your bare arm . Adjust snuggly, so that only two fingertips can fit between your skin and the top of the cuff . Check 2 readings separated by at least one minute . Keep a log of your BP readings . For a visual, please reference this diagram: http://ccnc.care/bpdiagram  Provider Name: [facility]     Phone: [phone]  Zone 1: ALL CLEAR  Continue to monitor your symptoms:  . BP reading is less than 140 (top number) or less than 90 (bottom number)  . No right upper stomach pain . No headaches or seeing spots . No feeling nauseated or throwing up . No swelling in face and hands  Zone 2: CAUTION Call your doctor's office for any of the following:  . BP reading is greater than 140 (top number) or greater than 90 (bottom number)  . Stomach pain under your ribs in the middle or right side . Headaches or seeing spots . Feeling nauseated or throwing up . Swelling in face and hands  Zone 3: EMERGENCY  Seek immediate medical care if you have any of the following:  . BP reading is greater than160 (top number) or greater than 110 (bottom number) . Severe headaches not improving with Tylenol . Serious difficulty catching your breath . Any worsening symptoms from Zone 2     Second Trimester of Pregnancy The second trimester is from week 14 through week 27 (months 4 through 6). The second trimester is  often a time when you feel your best. Your body has adjusted to being pregnant, and you begin to feel better physically. Usually, morning sickness has lessened or quit completely, you may have more energy, and you may have an increase in appetite. The second trimester is also a time when the fetus is growing rapidly. At the end of the sixth month, the fetus is about 9 inches long and weighs about 1 pounds. You will likely begin to feel the baby move (quickening) between 16 and 20 weeks of pregnancy. Body changes during your second trimester Your body continues to go through many changes  during your second trimester. The changes vary from woman to woman.  Your weight will continue to increase. You will notice your lower abdomen bulging out.  You may begin to get stretch marks on your hips, abdomen, and breasts.  You may develop headaches that can be relieved by medicines. The medicines should be approved by your health care provider.  You may urinate more often because the fetus is pressing on your bladder.  You may develop or continue to have heartburn as a result of your pregnancy.  You may develop constipation because certain hormones are causing the muscles that push waste through your intestines to slow down.  You may develop hemorrhoids or swollen, bulging veins (varicose veins).  You may have back pain. This is caused by: ? Weight gain. ? Pregnancy hormones that are relaxing the joints in your pelvis. ? A shift in weight and the muscles that support your balance.  Your breasts will continue to grow and they will continue to become tender.  Your gums may bleed and may be sensitive to brushing and flossing.  Dark spots or blotches (chloasma, mask of pregnancy) may develop on your face. This will likely fade after the baby is born.  A dark line from your belly button to the pubic area (linea nigra) may appear. This will likely fade after the baby is born.  You may have changes in your hair. These can include thickening of your hair, rapid growth, and changes in texture. Some women also have hair loss during or after pregnancy, or hair that feels dry or thin. Your hair will most likely return to normal after your baby is born.  What to expect at prenatal visits During a routine prenatal visit:  You will be weighed to make sure you and the fetus are growing normally.  Your blood pressure will be taken.  Your abdomen will be measured to track your baby's growth.  The fetal heartbeat will be listened to.  Any test results from the previous visit will be  discussed.  Your health care provider may ask you:  How you are feeling.  If you are feeling the baby move.  If you have had any abnormal symptoms, such as leaking fluid, bleeding, severe headaches, or abdominal cramping.  If you are using any tobacco products, including cigarettes, chewing tobacco, and electronic cigarettes.  If you have any questions.  Other tests that may be performed during your second trimester include:  Blood tests that check for: ? Low iron levels (anemia). ? High blood sugar that affects pregnant women (gestational diabetes) between 41 and 28 weeks. ? Rh antibodies. This is to check for a protein on red blood cells (Rh factor).  Urine tests to check for infections, diabetes, or protein in the urine.  An ultrasound to confirm the proper growth and development of the baby.  An amniocentesis to  check for possible genetic problems.  Fetal screens for spina bifida and Down syndrome.  HIV (human immunodeficiency virus) testing. Routine prenatal testing includes screening for HIV, unless you choose not to have this test.  Follow these instructions at home: Medicines  Follow your health care provider's instructions regarding medicine use. Specific medicines may be either safe or unsafe to take during pregnancy.  Take a prenatal vitamin that contains at least 600 micrograms (mcg) of folic acid.  If you develop constipation, try taking a stool softener if your health care provider approves. Eating and drinking  Eat a balanced diet that includes fresh fruits and vegetables, whole grains, good sources of protein such as meat, eggs, or tofu, and low-fat dairy. Your health care provider will help you determine the amount of weight gain that is right for you.  Avoid raw meat and uncooked cheese. These carry germs that can cause birth defects in the baby.  If you have low calcium intake from food, talk to your health care provider about whether you should take a  daily calcium supplement.  Limit foods that are high in fat and processed sugars, such as fried and sweet foods.  To prevent constipation: ? Drink enough fluid to keep your urine clear or pale yellow. ? Eat foods that are high in fiber, such as fresh fruits and vegetables, whole grains, and beans. Activity  Exercise only as directed by your health care provider. Most women can continue their usual exercise routine during pregnancy. Try to exercise for 30 minutes at least 5 days a week. Stop exercising if you experience uterine contractions.  Avoid heavy lifting, wear low heel shoes, and practice good posture.  A sexual relationship may be continued unless your health care provider directs you otherwise. Relieving pain and discomfort  Wear a good support bra to prevent discomfort from breast tenderness.  Take warm sitz baths to soothe any pain or discomfort caused by hemorrhoids. Use hemorrhoid cream if your health care provider approves.  Rest with your legs elevated if you have leg cramps or low back pain.  If you develop varicose veins, wear support hose. Elevate your feet for 15 minutes, 3-4 times a day. Limit salt in your diet. Prenatal Care  Write down your questions. Take them to your prenatal visits.  Keep all your prenatal visits as told by your health care provider. This is important. Safety  Wear your seat belt at all times when driving.  Make a list of emergency phone numbers, including numbers for family, friends, the hospital, and police and fire departments. General instructions  Ask your health care provider for a referral to a local prenatal education class. Begin classes no later than the beginning of month 6 of your pregnancy.  Ask for help if you have counseling or nutritional needs during pregnancy. Your health care provider can offer advice or refer you to specialists for help with various needs.  Do not use hot tubs, steam rooms, or saunas.  Do not  douche or use tampons or scented sanitary pads.  Do not cross your legs for long periods of time.  Avoid cat litter boxes and soil used by cats. These carry germs that can cause birth defects in the baby and possibly loss of the fetus by miscarriage or stillbirth.  Avoid all smoking, herbs, alcohol, and unprescribed drugs. Chemicals in these products can affect the formation and growth of the baby.  Do not use any products that contain nicotine or tobacco, such as  cigarettes and e-cigarettes. If you need help quitting, ask your health care provider.  Visit your dentist if you have not gone yet during your pregnancy. Use a soft toothbrush to brush your teeth and be gentle when you floss. Contact a health care provider if:  You have dizziness.  You have mild pelvic cramps, pelvic pressure, or nagging pain in the abdominal area.  You have persistent nausea, vomiting, or diarrhea.  You have a bad smelling vaginal discharge.  You have pain when you urinate. Get help right away if:  You have a fever.  You are leaking fluid from your vagina.  You have spotting or bleeding from your vagina.  You have severe abdominal cramping or pain.  You have rapid weight gain or weight loss.  You have shortness of breath with chest pain.  You notice sudden or extreme swelling of your face, hands, ankles, feet, or legs.  You have not felt your baby move in over an hour.  You have severe headaches that do not go away when you take medicine.  You have vision changes. Summary  The second trimester is from week 14 through week 27 (months 4 through 6). It is also a time when the fetus is growing rapidly.  Your body goes through many changes during pregnancy. The changes vary from woman to woman.  Avoid all smoking, herbs, alcohol, and unprescribed drugs. These chemicals affect the formation and growth your baby.  Do not use any tobacco products, such as cigarettes, chewing tobacco, and  e-cigarettes. If you need help quitting, ask your health care provider.  Contact your health care provider if you have any questions. Keep all prenatal visits as told by your health care provider. This is important. This information is not intended to replace advice given to you by your health care provider. Make sure you discuss any questions you have with your health care provider. Document Released: 02/23/2001 Document Revised: 08/07/2015 Document Reviewed: 05/02/2012 Elsevier Interactive Patient Education  2017 [place] FLU! Because you are pregnant, we at [facility], along with the Centers for Disease Control (CDC), recommend that you receive the flu vaccine to protect yourself and your baby from the flu. The flu is more likely to cause severe illness in pregnant women than in women of reproductive age who are not pregnant. Changes in the immune system, heart, and lungs during pregnancy make pregnant women (and women up to two weeks postpartum) more prone to severe illness from flu, including illness resulting in hospitalization. Flu also may be harmful for a pregnant woman's developing baby. A common flu symptom is fever, which may be associated with neural tube defects and other adverse outcomes for a developing baby. Getting vaccinated can also help protect a baby after birth from flu. (Mom passes antibodies onto the developing baby during her pregnancy.)  A Flu Vaccine is the Best Protection Against Flu Getting a flu vaccine is the first and most important step in protecting against flu. Pregnant women should get a flu shot and not the live attenuated influenza vaccine (LAIV), also known as nasal spray flu vaccine. Flu vaccines given during pregnancy help protect both the mother and her baby from flu. Vaccination has been shown to reduce the risk of flu-associated acute respiratory infection in pregnant women by up to one-half. A 2018 study showed  that getting a flu shot reduced a pregnant woman's risk of being hospitalized with flu by an average of  40 percent. Pregnant women who get a flu vaccine are also helping to protect their babies from flu illness for the first several months after their birth, when they are too young to get vaccinated.   A Long Record of Safety for Flu Shots in Pregnant Women Flu shots have been given to millions of pregnant women over many years with a good safety record. There is a lot of evidence that flu vaccines can be given safely during pregnancy; though these data are limited for the first trimester. The CDC recommends that pregnant women get vaccinated during any trimester of their pregnancy. It is very important for pregnant women to get the flu shot.   Other Preventive Actions In addition to getting a flu shot, pregnant women should take the same everyday preventive actions the CDC recommends of everyone, including covering coughs, washing hands often, and avoiding people who are sick.  Symptoms and Treatment If you get sick with flu symptoms call your doctor right away. There are antiviral drugs that can treat flu illness and prevent serious flu complications. The CDC recommends prompt treatment for people who have influenza infection or suspected influenza infection and who are at high risk of serious flu complications, such as people with asthma, diabetes (including gestational diabetes), or heart disease. Early treatment of influenza in hospitalized pregnant women has been shown to reduce the length of the hospital stay.  Symptoms Flu symptoms include fever, cough, sore throat, runny or stuffy nose, body aches, headache, chills and fatigue. Some people may also have vomiting and diarrhea. People may be infected with the flu and have respiratory symptoms without a fever.  Early Treatment is Important for Pregnant Women Treatment should begin as soon as possible because antiviral drugs work best when  started early (within 48 hours after symptoms start). Antiviral drugs can make your flu illness milder and make you feel better faster. They may also prevent serious health problems that can result from flu illness. Oral oseltamivir (Tamiflu) is the preferred treatment for pregnant women because it has the most studies available to suggest that it is safe and beneficial. Antiviral drugs require a prescription from your provider. Having a fever caused by flu infection or other infections early in pregnancy may be linked to birth defects in a baby. In addition to taking antiviral drugs, pregnant women who get a fever should treat their fever with Tylenol (acetaminophen) and contact their provider immediately.  When to [place] If you are pregnant and have any of these signs, seek care immediately:  Difficulty breathing or shortness of breath  Pain or pressure in the chest or abdomen  Sudden dizziness  Confusion  Severe or persistent vomiting  High fever that is not responding to Tylenol (or store brand equivalent)  Decreased or no movement of your baby  [email or web address]

## 2019-05-16 NOTE — Progress Notes (Signed)
HIGH-RISK PREGNANCY VISIT Patient name: Angel Moore MRN [id number]  Date of birth: 04-Oct-1980 Chief Complaint:   High Risk Gestation (numbness in both hands)  History of Present Illness:   Angel Moore is a 39 y.o. G15P1001 female at [redacted]w[redacted]d with an Estimated Date of Delivery: 09/18/19 being seen today for ongoing management of a high-risk pregnancy complicated by chronic hypertension currently on Labetalol 200mg  BID, AFP+ T18 (NIPS neg), prev c/s.  Today she reports has not taken bp meds today, has been stressed about abnormal genetic testing. Has appt w/ MFM on Monday.  Denies ha, visual changes, ruq/epigastric pain, n/v.  Numbness both hands.  Contractions: Not present. Vag. Bleeding: None.  Movement: Present. denies leaking of fluid.  Review of Systems:   Pertinent items are noted in HPI Denies abnormal vaginal discharge w/ itching/odor/irritation, headaches, visual changes, shortness of breath, chest pain, abdominal pain, severe nausea/vomiting, or problems with urination or bowel movements unless otherwise stated above. Pertinent History Reviewed:  Reviewed past medical,surgical, social, obstetrical and family history.  Reviewed problem list, medications and allergies. Physical Assessment:   Vitals:   05/16/19 1515 05/16/19 1520  BP: (!) 160/101 (!) 153/102  Pulse: 100 87  Weight: 217 lb 9.6 oz (98.7 kg)   Body mass index is 38.55 kg/m.           Physical Examination:   General appearance: alert, well appearing, and in no distress  Mental status: alert, oriented to person, place, and time  Skin: warm & dry   Extremities: Edema: None    Cardiovascular: normal heart rate noted  Respiratory: normal respiratory effort, no distress  Abdomen: gravid, soft, non-tender  Pelvic: Cervical exam deferred         Fetal Status: Fetal Heart Rate (bpm): 155   Movement: Present    Fetal Surveillance Testing today: doppler   Chaperone: n/a    Results for orders placed or performed in  visit on 05/16/19 (from the past 24 hour(s))  POC Urinalysis Dipstick OB   Collection Time: 05/16/19  3:17 PM  Result Value Ref Range   Color, UA     Clarity, UA     Glucose, UA Negative Negative   Bilirubin, UA     Ketones, UA neg    Spec Grav, UA     Blood, UA trace    pH, UA     POC,PROTEIN,UA Negative Negative, Trace, Small (1+), Moderate (2+), Large (3+), 4+   Urobilinogen, UA     Nitrite, UA neg    Leukocytes, UA Trace (A) Negative   Appearance     Odor      Assessment & Plan:  1) High-risk pregnancy G2P1001 at [redacted]w[redacted]d with an Estimated Date of Delivery: 09/18/19   2) CHTN, on Labetalol 200mg  BID, ASA, has not taken Labetalol today. To go get bp cuff today (hasn't picked up yet), take Labetalol tonight when she gets home and again in morning, check bp 1hr after taking med, send [place] mychart or call w/ bp. If still severe range- increase Labetalol to 300mg  (rx sent prior to knowing she hadn't taken her med today), if normal or non-severe will keep at 200mg  BID  3) Prev c/s, wants RCS w/ BTL  4) AFP +T18> w/ neg NIPS, sees MFM for detailed u/s, genetic counseling on Monday. Discussed further today, pt very stressed. Also discussed and gave printed info on isolated LVEICF.   5) Sickle cell trait  6) Carpal tunnel> try wrist splints  Meds:  Meds ordered this encounter  Medications  . labetalol (NORMODYNE) 300 MG tablet    Sig: Take 1 tablet (300 mg total) by mouth 2 (two) times daily.    Dispense:  60 tablet    Refill:  6    Order Specific Question:   Supervising Provider    Answer:   Lazaro Arms [[id number]]    Labs/procedures today: none  Treatment Plan: Growth u/s q4wks    2x/wk testing nst/sono @ 32wks or weekly BPP    Deliver 38-39wks (37wks or prn if poor control)____   Reviewed: Preterm labor symptoms and general obstetric precautions including but not limited to vaginal bleeding, contractions, leaking of fluid and fetal movement were reviewed in detail with the  patient.  All questions were answered.   Follow-up: Return in about 2 weeks (around 05/30/2019) for HROB, in person, MD or CNM.  Orders Placed This Encounter  Procedures  . [place] MFM OB DETAIL +14 WK  . POC Urinalysis Dipstick OB   Cheral Marker CNM, [facility] 05/16/2019 5:07 PM

## 2019-05-16 NOTE — Telephone Encounter (Signed)
Angel Moore with MFM called. Dr. Judeth Cornfield would like for patient to have an anatomy ultrasound done at [facility]FM prior to the genetic counseling. They wanted to see if Dr .Despina Hidden would authorize this for her. [name] requesting call back after discussed with Dr. Despina Hidden. [phone]. Her appointment is on 3/8

## 2019-05-21 ENCOUNTER — Ambulatory Visit (HOSPITAL_BASED_OUTPATIENT_CLINIC_OR_DEPARTMENT_OTHER): Payer: Medicare Other | Admitting: Genetic Counselor

## 2019-05-21 ENCOUNTER — Encounter (HOSPITAL_COMMUNITY): Payer: Self-pay | Admitting: *Deleted

## 2019-05-21 ENCOUNTER — Other Ambulatory Visit: Payer: Self-pay

## 2019-05-21 ENCOUNTER — Encounter: Payer: Self-pay | Admitting: Women's Health

## 2019-05-21 ENCOUNTER — Ambulatory Visit (HOSPITAL_COMMUNITY): Payer: Medicare Other | Admitting: *Deleted

## 2019-05-21 ENCOUNTER — Other Ambulatory Visit (HOSPITAL_COMMUNITY): Payer: Self-pay | Admitting: *Deleted

## 2019-05-21 ENCOUNTER — Ambulatory Visit (HOSPITAL_COMMUNITY)
Admission: RE | Admit: 2019-05-21 | Discharge: 2019-05-21 | Disposition: A | Discharge status: Home / Self Care | Payer: Medicare Other | Source: Ambulatory Visit | Attending: Obstetrics and Gynecology | Admitting: Obstetrics and Gynecology

## 2019-05-21 DIAGNOSIS — O10912 Unspecified pre-existing hypertension complicating pregnancy, second trimester: Secondary | ICD-10-CM | POA: Diagnosis not present

## 2019-05-21 DIAGNOSIS — O0992 Supervision of high risk pregnancy, unspecified, second trimester: Secondary | ICD-10-CM | POA: Diagnosis not present

## 2019-05-21 DIAGNOSIS — O285 Abnormal chromosomal and genetic finding on antenatal screening of mother: Secondary | ICD-10-CM

## 2019-05-21 DIAGNOSIS — O280 Abnormal hematological finding on antenatal screening of mother: Secondary | ICD-10-CM

## 2019-05-21 DIAGNOSIS — O0990 Supervision of high risk pregnancy, unspecified, unspecified trimester: Secondary | ICD-10-CM | POA: Diagnosis present

## 2019-05-21 DIAGNOSIS — Z3A22 22 weeks gestation of pregnancy: Secondary | ICD-10-CM | POA: Diagnosis not present

## 2019-05-21 DIAGNOSIS — Z315 Encounter for genetic counseling: Secondary | ICD-10-CM | POA: Diagnosis not present

## 2019-05-21 DIAGNOSIS — O10919 Unspecified pre-existing hypertension complicating pregnancy, unspecified trimester: Secondary | ICD-10-CM

## 2019-05-21 DIAGNOSIS — D573 Sickle-cell trait: Secondary | ICD-10-CM

## 2019-05-21 NOTE — Progress Notes (Addendum)
05/21/2019  Angel Moore [birth year] MRN: [id number] DOV: 05/21/2019  Angel Moore presented to the [facility] for a genetics consultation regarding her abnormal quad screen results and her carrier status for sickle cell disease. Angel Moore came to her appointment alone due to COVID-19 visitor restrictions.   Indication for genetic counseling - Increased risk for trisomy 18 onquad screen - Sickle cell trait  Prenatal history  Angel Moore is a G2P1001, 39 y.o. female. Her current pregnancy has completed [redacted]w[redacted]d (Estimated Date of Delivery: 09/18/19).  Angel Moore denied exposure to environmental toxins or chemical agents. She denied the use of alcohol or street drugs. She reported smoking approximately 2 cigarettes per day, which is significantly less than her prior usage. She reported taking prenatal vitamins, iron, baby aspirin, and Labetalol. She denied significant viral illnesses, fevers, and bleeding during the course of her pregnancy. Her medical and surgical histories were noncontributory.  Prenatal exposure to tobacco can increase the risk for placenta previa and placental abruption, preterm birth, low birth weight, oral clefts, stillbirth, and sudden infant death syndrome (SIDS). Prenatal exposure to nicotine is also associated with an increased likelihood of neurological disorder and asthma. The risk of pregnancy complications associated with cigarette exposure tends to increase with the amount of cigarettes a woman smokes. I praised Angel Moore for cutting back on the amount of cigarettes she smokes per day and encouraged her to continue trying to reduce the number of cigarettes smoked with the goal to eventually quit smoking altogether.  Family History  A three generation pedigree was drafted and reviewed. Both family histories were reviewed and found to be noncontributory for birth defects, intellectual disability, recurrent pregnancy loss, and known genetic conditions. Angel Moore  does not have information about her paternal family history; thus, risk assessment is limited.  The patient's ethnicity is African American. The father of the pregnancy's ethnicity is [place] American and Native [address] Ashkenazi Jewish ancestry and consanguinity were denied. Pedigree will be scanned under Media.  Discussion  Angel Moore was referred for genetic counseling due to quad screening that was high risk for trisomy 70. Angel Moore initially had MaterniT21 noninvasive prenatal screening (NIPS) through the laboratory [facility] in January that was low-risk for fetal aneuploidies. We reviewed that theseresults significantly decreased the risk for trisomies 71 (Down syndrome), 18 and 13, and sex chromosome aneuploidies in the current fetus. After NIPS, Angel Moore had quad screening performed. Results from quad screening came back positive for an increased risk of trisomy 18 in the current pregnancy.Based on results from quad screening, the risk for trisomy 18 in thecurrentpregnancy increased from Angel Moore 1 in 827 age-related risk to 1 in 12 (8.3%). Additionally, the risk for Down syndrome in the current pregnancy was decreased (1 in 1887), and the risk for ONTDs in the pregnancy was low (1 in 10,000). Of note, levels of AFP, hCG, and uE3 were all decreased on Angel Moore quad screen (0.39, 0.52, and 0.41 MoM respectively).  Trisomy18, AKA Edwards syndrome, is a condition associated with severeintellectual disability and physical abnormalities in many part of the body. Fetuses affected with trisomy 18 may present with central nervoussystemabnormalities, cardiacdefects, gastrointestinal anomalies, growth restriction, and abnormalities of the feet and hands including club feet, clenched hands, and overlapping fingers. Due to the severity of the condition, approximately30-60% of pregnancies affected with trisomy 18 result in miscarriage, fetal demise, or stillbirthfrom 20 weeks' gestation until term. Of  livebornbabies, approximately10% survive beyond the first year  of life. Those who survive have severe intellectual disabilitiesand often havevarious other health problems, such as seizures, open neural tube defects, craniofacial anomalies, vision abnormalities, hearing loss, congenital heart defects, musculoskeletal abnormalities, gastrointestinal problems, renal anomalies, and delayed growth.  Trisomy 18typicallyresults from having three copies of chromosome 18. Thismost often occursdue to arandom error in chromosomal divisionduring the formation of reproductive cells in a process callednondisjunction.The risk ofseveralfetal aneuploidies including trisomy 18increases with maternal age as a result of increased frequency in nondisjunctionerrors.Rarely, trisomy 18 may result from a translocation involving chromosome 64 and another chromosome.  Angel Moore was counseled on the difference between NIPS and quad screening. Firstly, we reviewed what the screens actually measure. NIPS analyzes cell free DNA originating from the placenta that is found in the maternal blood circulation during pregnancy. Generally, the placenta's chromosomal makeup is representative of the fetus, as the two originate from the same sperm and egg cell. However, in about 1-2% of pregnancies, the placenta differs chromosomally from the fetus. This phenomenon is referred to as confined placental mosaicism. NIPS is not diagnostic for chromosome conditions, but can provide information regarding the presence or absence of extra fetal/placental DNA for chromosomes 13, 18, 21, and the sex chromosomes. The reported detection rate for NIPS is 91-99% for trisomies21, 18, 13, and sex chromosome aneuploidies. The false positive rate for NIPS is reported to be less than 0.1% for any of these conditions. The false negative rate for MaterniT21 NIPS is reported to be 1.89%. NIPS cannot assess for open neural tube defects (ONTDs). It is  recommended that when women receive a low-risk NIPS result, Angel Moore-AFP screening, not another form of aneuploidy screening such as quad screening, be ordered in the second trimester to assess the risk of ONTDs in a pregnancy. Quad screening is another form of screening for chromosomal abnormalities. Instead of utilizing fetal DNA, quad screening measures levels of four hormones in a pregnantwoman's blood. These hormones are AFP, hCG, inhibin (DIA), and unconjugated estriol (uE3). The levels of these hormones can help identify if a pregnancy is at high risk for trisomy 57 (Down syndrome), trisomy 18, and ONTDs. Quad screening detects 75-80% of cases of Down syndrome, 73% of cases of trisomy 18, and 80-90% of ONTDs. The reported false positive rate for quad screening is 5%. It cannot assess for the risk of trisomy 40 or sex chromosome aneuploidies in a pregnancy. Neither NIPS nor quad screening are diagnostic for fetal chromosomal aneuploidies.   In pregnancies where a fetus has trisomy 25, the levels of AFP, hCG, DIA, and uE3 on quad screening are often low. Since the levels of AFP, hCG, and uE3 detected on Angel Moore quad screen were decreased, the pregnancy screened positive for trisomy 18. However, Angel Moore initial NIPS result determined that the pregnancy was low-risk for trisomy 26. Given that quad screening has a higher false positive rate than the false negative rate of NIPS, and the fact that NIPS has a greater sensitivity for detecting trisomy 18 than quad screening, the result from NIPS is likely more accurate. Additionally, decreased levels of analytes from quad screening can be associated with abnormalities aside from trisomy 18. For example, low uE3 can be associated with fetal growth restriction and fetal loss. Data also suggests that screen positive rates for trisomy 18 on quad screening are more common in women who smoke cigarettes compared to age-matched non-smokers Roosevelt Locks et al., 2011). Each of  these factors could contribute to Angel Moore. Gisler's contradictory risk estimates for trisomy 7 between  NIPS and quad screening. Angel Moore was counseled that the pregnancy is likely low-risk for trisomy 64, but may be at risk for other adverse obstetrical outcomes.   A complete ultrasound was performed today prior to our visit. The ultrasound report will be sent under separate cover. There were no visualized fetal anomalies or markers suggestive of aneuploidy. Angel Moore was counseled that approximately 90% of fetuses with trisomy 18will have one or more finding on anatomy ultrasound, including congenital heart defects, congenital diaphragmatic hernia, omphalocele, imperforated anus, microcephaly, microphthalmia, renal anomalies, open neural tube defects, limb abnormalities such as polydactyly, clenched hands, and rocker bottom feet, and fetal growth restriction. The fact that no ultrasound anomalies were identified today is encouraging that the low-risk NIPS is likely the more accurate result; however, 10% of fetuses with trisomy 18 may not demonstrate any signs of the condition on ultrasound.  Angel Moore also had a hemoglobin electrophoresis that confirmed that she has hemoglobin S trait and thus is a carrier for sickle cell disease AKA sickle cell anemia (SCA). We discussed that SCA is one condition in a group of blood disorders that affect hemoglobin in red blood cells (hemoglobinopathies). Hemoglobin is a protein that transports oxygen from the lungs to organs and tissues throughout the body. Individuals with SCA have an inherited structural abnormality in hemoglobin's beta globin chains due to a single amino acid change in the HBB gene. Instead of producing normal adult hemoglobin (Hgb A), individuals with SCA produce an atypical form of hemoglobin called hemoglobin S (Hgb S). Typically, individuals are expected to have two copies of Hgb A (Hgb AA). Individuals who are carriers of SCA have one copy of Hgb A and one copy  of Hgb S (Hgb AS), whereas individuals affected by SCA have two copies of Hgb S (Hgb SS). Carriers of SCA are often said to have sickle cell "trait".   Hgb S alters the configuration of the hemoglobin molecule. As a result, individuals with SCA have red blood cells that can sickle and obstruct blood flow in small blood vessels, causing ischemia of tissues and organs and episodes of vaso-occlusive crisis. The amino acid change in the HBB gene also causes red blood cells to become fragile and break down easily, which results in chronic anemia. Additional complications associated with SCA may include organ damage, frequent infections, acute chest syndrome, ischemic stroke, splenic sequestration, priapism, and pulmonary hypertension. SCA is inherited in an autosomal recessive pattern, where both parents must carry Hgb S trait to have a chance of having an affected child. If Angel Moore partner were also a carrier of SCA, the couple would have a 1 in 4 (25%) chance of having a child with SCA.    Hgb S is just one variant form of hemoglobin caused by a mutations in the HBB gene. It is also possible that Angel Moore. Nesser's partner could carry another variant form of hemoglobin, such as hemoglobin C. If he did, the couple would have a 1 in 4 (25%) chance of having a child with hemoglobin [place] disease (HbSC disease). Individuals with HbSC disease have red blood cells that contain both hemoglobin S and hemoglobin C. These variant forms of hemoglobin can cause red blood cells to become rigid and sickle, blocking small blood vessels and making it difficult for the red blood cells to deliver oxygen to the body's tissues. This can cause severe pain and organ damage, just as we see in individuals with SCA. Individuals with HbSC disease are at risk of  the same complications as those associated with SCA, such as pain crises, acute chest syndrome, infections, asplenia, and strokes; however, these complications may occur at a lesser frequency  and may be milder.   Finally, Angel Moore. Marigene EhlersLee's partner may have a different variant in the HBB gene that could make a carrier of beta-thalassemia. If he did, the couple would have a 1 in 4 (25%) chance of having a child with sickle beta thalassemia. The severity of sickle beta thalassemia depends on the normal amount of beta globin that is produced. If an individual produces no beta globin (sickle beta zero thalassemia), they will experience symptoms similar to SCA. If an individual produces a reduced amount of beta globin (sickle beta plus thalassemia), they may experience symptoms that are similar to a milder form of SCA.   Given his ethnicity, Angel Moore. Marigene EhlersLee's partner has a 1 in 5611 chance of carrying hemoglobin S trait, a 1 in 38 chance of carrying hemoglobin C trait, and a 1 in 75 chance of carrying beta-thalassemia. It is recommended that he undergo carrier screening to refine the risks for the current pregnancy to be affected with SCA, HbSC disease, or sickle beta thalassemia. Angel Moore. Nedra HaiLee was potentially interested in pursuing partner carrier screening. However, she wanted to discuss this option with her partner before making a decision.  Angel Moore. Nedra HaiLee was also counseled regarding diagnostic testing via amniocentesis. We discussed the technical aspects of the procedure and quoted up to a 1 in 500 (0.2%) risk for spontaneous pregnancy loss or other adverse pregnancy outcomes as a result of amniocentesis. Cultured cells from an amniocentesis sample allow for the visualization of a fetal karyotype, which can detect >99% of chromosomal aberrations, including trisomy 18. Chromosomal microarray can also be performed to identify smaller deletions or duplications of fetal chromosomal material. Amniocentesis could also be performed to assess whether the baby is affected by SCA, HbSC disease, or sickle beta thalassemia. AngelLeewas informed that diagnostic testing would be the only way todefinitively determineif the fetus hastrisomy  18 prenatally. Angel Moore. Nedra HaiLee was also made aware that she has the option of continuing with standardultrasounds and pursuing genetic testing postnatally if clinically indicated. After careful consideration, Angel Moore. Nedra HaiLee declined amniocentesis at this time. She understands that amniocentesis is available at any point after 16 weeks of pregnancy and that she may opt to undergo the procedure at a later date should she change her mind.  Additional screening and diagnostic testing were declined today. Angel Moore. Nedra HaiLee felt reassured by her negative NIPS results and normal ultrasound. She understands that screening tests, including ultrasound, cannot rule out all birth defects or genetic syndromes. The patient was advised of this limitation and states she still does not want additional testing or screening at this time.   I encouraged Angel Moore. Philbin to discuss carrier screening for hemoglobinopathies with her partner, Channing MuttersRoy Herbin. If he is interested in pursuing testing and has health insurance, I could perform a benefits investigation to determine the expected out of pocket cost for testing. The laboratory [facility] also offers a patient pay option of $250 that bypasses insurance altogether. If Mr. Marylyn IshiharaHerbin does not have health insurance, he may qualify for free testing through [facility]'s Patient Assistance Program. Angel Moore. Nedra HaiLee agreed to contact me once she has had a chance to discuss testing with her partner.  I counseled Angel Moore. Saia regarding the above risks and available options. The approximate face-to-face time with the genetic counselor was 45 minutes.  In summary:  Reviewed low-risk NIPS results  Reduction  in risk forDown syndrome,trisomy 18,trisomy 13, and sex chromosome aneuploidies  Reviewed quad screening results  1 in 12 (8.3%) risk for trisomy 3 due to decreased AFP, hCG, and uE3  Discussed discrepancy in trisomy 63 risks for pregnancy and options for follow-up testing  NIPS more accurate than quad screening, so pregnancy  likely at low risk for trisomy 51  Decreased AFP, hCG, and uE3 may be associated with other pregnancy complications aside from trisomy 74  Declined amniocentesis  Reviewed results of ultrasound  No fetal anomalies or markers seen  Reduction in risk for fetal aneuploidy (including trisomy 23)  Discussed carrier screening results and options for follow-up testing  Sickle cell trait  Potentially interested in partner carrier screening. Patient will contact me to facilitate this if desired after discussing with her partner  Reviewed family history concerns   Gershon Crane, Angel Moore, Aeronautical engineer

## 2019-05-30 ENCOUNTER — Encounter (HOSPITAL_COMMUNITY): Payer: Self-pay | Admitting: Obstetrics & Gynecology

## 2019-05-30 ENCOUNTER — Encounter: Payer: Medicare Other | Admitting: Advanced Practice Midwife

## 2019-06-18 ENCOUNTER — Other Ambulatory Visit: Payer: Self-pay

## 2019-06-18 ENCOUNTER — Encounter: Payer: Self-pay | Admitting: Women's Health

## 2019-06-18 ENCOUNTER — Ambulatory Visit (INDEPENDENT_AMBULATORY_CARE_PROVIDER_SITE_OTHER): Payer: Medicare Other | Admitting: Women's Health

## 2019-06-18 VITALS — BP 126/83 | HR 92 | Wt 216.0 lb

## 2019-06-18 DIAGNOSIS — O10912 Unspecified pre-existing hypertension complicating pregnancy, second trimester: Secondary | ICD-10-CM

## 2019-06-18 DIAGNOSIS — D573 Sickle-cell trait: Secondary | ICD-10-CM

## 2019-06-18 DIAGNOSIS — O0992 Supervision of high risk pregnancy, unspecified, second trimester: Secondary | ICD-10-CM

## 2019-06-18 DIAGNOSIS — O0990 Supervision of high risk pregnancy, unspecified, unspecified trimester: Secondary | ICD-10-CM

## 2019-06-18 DIAGNOSIS — O10919 Unspecified pre-existing hypertension complicating pregnancy, unspecified trimester: Secondary | ICD-10-CM

## 2019-06-18 DIAGNOSIS — Z3A26 26 weeks gestation of pregnancy: Secondary | ICD-10-CM

## 2019-06-18 DIAGNOSIS — O09292 Supervision of pregnancy with other poor reproductive or obstetric history, second trimester: Secondary | ICD-10-CM

## 2019-06-18 DIAGNOSIS — O99012 Anemia complicating pregnancy, second trimester: Secondary | ICD-10-CM

## 2019-06-18 DIAGNOSIS — O280 Abnormal hematological finding on antenatal screening of mother: Secondary | ICD-10-CM

## 2019-06-18 DIAGNOSIS — Z1389 Encounter for screening for other disorder: Secondary | ICD-10-CM

## 2019-06-18 DIAGNOSIS — Z331 Pregnant state, incidental: Secondary | ICD-10-CM

## 2019-06-18 LAB — POCT URINALYSIS DIPSTICK OB
Blood, UA: NEGATIVE
Glucose, UA: NEGATIVE
Ketones, UA: NEGATIVE
Leukocytes, UA: NEGATIVE
Nitrite, UA: NEGATIVE
POC,PROTEIN,UA: NEGATIVE

## 2019-06-18 MED ORDER — LABETALOL HCL 200 MG PO TABS: 200.0000 mg | ORAL_TABLET | Freq: Two times a day (BID) | ORAL | 6 refills | Status: DC

## 2019-06-18 NOTE — Progress Notes (Signed)
HIGH-RISK PREGNANCY VISIT Patient name: Angel Moore MRN [id number]  Date of birth: Jul 29, 1980 Chief Complaint:   Routine Prenatal Visit  History of Present Illness:   Angel Moore is a 39 y.o. G19P1001 female at [redacted]w[redacted]d with an Estimated Date of Delivery: 09/18/19 being seen today for ongoing management of a high-risk pregnancy complicated by chronic hypertension currently on Labetalol 200mg  BID, h/o pre-e, increased r/f T18, NIPS neg, normal detailed u/s w/ MFM, prev c/s, SCT.  Today she reports no complaints. Had appt w/ genetic counselor and detailed u/s w/ MFM, isolated EICF only, has appt tomorrow for f/u efw-wants to do remained of growth u/s here.  Did not have to increase labetalol dose to 300mg  as previously discussed, home bp's have been good on 200mg  BID.   Contractions: Not present. Vag. Bleeding: None.  Movement: Present. denies leaking of fluid.  Review of Systems:   Pertinent items are noted in HPI Denies abnormal vaginal discharge w/ itching/odor/irritation, headaches, visual changes, shortness of breath, chest pain, abdominal pain, severe nausea/vomiting, or problems with urination or bowel movements unless otherwise stated above. Pertinent History Reviewed:  Reviewed past medical,surgical, social, obstetrical and family history.  Reviewed problem list, medications and allergies. Physical Assessment:   Vitals:   06/18/19 1516  BP: 126/83  Pulse: 92  Weight: 216 lb (98 kg)  Body mass index is 38.26 kg/m.           Physical Examination:   General appearance: alert, well appearing, and in no distress  Mental status: alert, oriented to person, place, and time  Skin: warm & dry   Extremities: Edema: None    Cardiovascular: normal heart rate noted  Respiratory: normal respiratory effort, no distress  Abdomen: gravid, soft, non-tender  Pelvic: Cervical exam deferred         Fetal Status: Fetal Heart Rate (bpm): 150 Fundal Height: 28 cm Movement: Present    Fetal  Surveillance Testing today: doppler   Chaperone: n/a    Results for orders placed or performed in visit on 06/18/19 (from the past 24 hour(s))  POC Urinalysis Dipstick OB   Collection Time: 06/18/19  3:23 PM  Result Value Ref Range   Color, UA     Clarity, UA     Glucose, UA Negative Negative   Bilirubin, UA     Ketones, UA neg    Spec Grav, UA     Blood, UA neg    pH, UA     POC,PROTEIN,UA Negative Negative, Trace, Small (1+), Moderate (2+), Large (3+), 4+   Urobilinogen, UA     Nitrite, UA neg    Leukocytes, UA Negative Negative   Appearance     Odor      Assessment & Plan:  1) High-risk pregnancy G2P1001 at [redacted]w[redacted]d with an Estimated Date of Delivery: 09/18/19   2) CHTN, stable on Labetalol 200mg  BID, ASA. Has growth u/s tomorrow w/ MFM, wants to do rest of growth u/s here instead of MFM  3) H/O pre-e  4) Prev c/s> for RCS w/ BTL  5) AFP +T18> normal NIPS, normal detailed u/s w/ MFM (isolated EICF only)  6) SCT+> urine cx today  Meds:  Meds ordered this encounter  Medications  . labetalol (NORMODYNE) 200 MG tablet    Sig: Take 1 tablet (200 mg total) by mouth 2 (two) times daily.    Dispense:  60 tablet    Refill:  6    Order Specific Question:   Supervising Provider  Answer:   Lazaro Arms [[id number]]    Labs/procedures today: none  Treatment Plan:  Growth u/sq4wks    2x/wk testing nst/sono @ 32wks or weekly BPP    Deliver 38-39wks (37wks or prn if poor control)____   Reviewed: Preterm labor symptoms and general obstetric precautions including but not limited to vaginal bleeding, contractions, leaking of fluid and fetal movement were reviewed in detail with the patient.  All questions were answered. Has home bp cuff.   Follow-up: Return in about 8 days (around 06/26/2019) for HROB, PN2, in person, MD or CNM.  Orders Placed This Encounter  Procedures  . Urine Culture  . POC Urinalysis Dipstick OB   Cheral Marker CNM, [facility] 06/18/2019 4:15 PM

## 2019-06-18 NOTE — Patient Instructions (Signed)
Angel Moore, I greatly value your feedback.  If you receive a survey following your visit with [place] today, we appreciate you taking the time to fill it out.  Thanks, Angel Moore, CNM, WHNP-BC   You will have your sugar test next visit.  Please do not eat or drink anything after midnight the night before you come, not even water.  You will be here for at least two hours.  Please make an appointment online for the bloodwork at [email or web address] for 8:30am (or as close to this as possible). Make sure you select the [facility]. The day of the appointment, check in with our office first, then you will go to [facility] to start the sugar test.    [facility] at [facility] [place], [place] [address]) Entrance C, located off of [address] valet parking  Go to [email or web address] to register for FREE online childbirth classes   Call the office [phone]) or go to [facility] if:  You begin to have strong, frequent contractions  Your water breaks.  Sometimes it is a big gush of fluid, sometimes it is just a trickle that keeps getting your panties wet or running down your legs  You have vaginal bleeding.  It is normal to have a small amount of spotting if your cervix was checked.   You don't feel your baby moving like normal.  If you don't, get you something to eat and drink and lay down and focus on feeling your baby move.   If your baby is still not moving like normal, you should call the office or go to [facility].  Angel Moore/Angel Moore:  Angel Moore Moore [phone]            [facility] [phone]                 [facility] [phone] (usually not accepting new patients unless you have Angel there already, you are always welcome to call and ask)       [facility] [phone]       Angel Moore Moore Moore/Angel Moore:   Angel Moore:  [phone]  Angel Moore: [phone]  Angel Moore: [phone]  Angel Moore:   Angel Moore: [phone]   Angel Moore Angel Moore: [phone]  Angel Moore Moore:  Angel Moore: [phone]   Home Blood Pressure Monitoring for Patients   Your provider has recommended that you check your blood pressure (BP) at least once a week at home. If you do not have a blood pressure cuff at home, one will be provided for you. Contact your provider if you have not received your monitor within 1 week.   Helpful Tips for Accurate Home Blood Pressure Checks  . Don't smoke, exercise, or drink caffeine 30 minutes before checking your BP . Use the restroom before checking your BP (a full bladder can raise your pressure) . Relax in a comfortable upright chair . Feet on the ground . Left arm resting comfortably on a flat surface at the level of your heart . Legs uncrossed . Back supported . Sit quietly and don't talk . Place the cuff on your bare arm . Adjust snuggly, so that only two fingertips can fit between your skin and the top of the cuff . Check 2 readings separated by at least one minute . Keep a log of your BP readings . For a visual, please  reference this diagram: http://ccnc.care/bpdiagram  Provider Name: Angel Moore     Phone: [phone]  Zone 1: ALL CLEAR  Continue to monitor your symptoms:  . BP reading is less than 140 (top number) or less than 90 (bottom number)  . No right upper stomach pain . No headaches or seeing spots . No feeling nauseated or throwing up . No swelling in face and hands  Zone 2: CAUTION Call your doctor's office for any of the following:  . BP reading is greater than 140 (top number) or greater than 90 (bottom number)  . Stomach pain under your ribs in the middle or right side . Headaches or seeing spots . Feeling nauseated or throwing up . Swelling in  face and hands  Zone 3: EMERGENCY  Seek immediate medical care if you have any of the following:  . BP reading is greater than160 (top number) or greater than 110 (bottom number) . Severe headaches not improving with Tylenol . Serious difficulty catching your breath . Any worsening symptoms from Zone 2   Second Trimester of Pregnancy The second trimester is from week 13 through week 28, months 4 through 6. The second trimester is often a time when you feel your best. Your body has also adjusted to being pregnant, and you begin to feel better physically. Usually, morning sickness has lessened or quit completely, you may have more energy, and you may have an increase in appetite. The second trimester is also a time when the fetus is growing rapidly. At the end of the sixth month, the fetus is about 9 inches long and weighs about 1 pounds. You will likely begin to feel the baby move (quickening) between 18 and 20 weeks of the pregnancy. BODY CHANGES Your body goes through many changes during pregnancy. The changes vary from woman to woman.   Your weight will continue to increase. You will notice your lower abdomen bulging out.  You may begin to get stretch marks on your hips, abdomen, and breasts.  You may develop headaches that can be relieved by medicines approved by your health care provider.  You may urinate more often because the fetus is pressing on your bladder.  You may develop or continue to have heartburn as a result of your pregnancy.  You may develop constipation because certain hormones are causing the muscles that push waste through your intestines to slow down.  You may develop hemorrhoids or swollen, bulging veins (varicose veins).  You may have back pain because of the weight gain and pregnancy hormones relaxing your joints between the bones in your pelvis and as a result of a shift in weight and the muscles that support your balance.  Your breasts will continue to grow  and be tender.  Your gums may bleed and may be sensitive to brushing and flossing.  Dark spots or blotches (chloasma, mask of pregnancy) may develop on your face. This will likely fade after the baby is born.  A dark line from your belly button to the pubic area (linea nigra) may appear. This will likely fade after the baby is born.  You may have changes in your hair. These can include thickening of your hair, rapid growth, and changes in texture. Some women also have hair loss during or after pregnancy, or hair that feels dry or thin. Your hair will most likely return to normal after your baby is born. WHAT TO EXPECT AT YOUR PRENATAL VISITS During a routine prenatal visit:  You  will be weighed to make sure you and the fetus are growing normally.  Your blood pressure will be taken.  Your abdomen will be measured to track your baby's growth.  The fetal heartbeat will be listened to.  Any test results from the previous visit will be discussed. Your health care provider may ask you:  How you are feeling.  If you are feeling the baby move.  If you have had any abnormal symptoms, such as leaking fluid, bleeding, severe headaches, or abdominal cramping.  If you have any questions. Other tests that may be performed during your second trimester include:  Blood tests that check for:  Low iron levels (anemia).  Gestational diabetes (between 24 and 28 weeks).  Rh antibodies.  Urine tests to check for infections, diabetes, or protein in the urine.  An ultrasound to confirm the proper growth and development of the baby.  An amniocentesis to check for possible genetic problems.  Fetal screens for spina bifida and Down syndrome. HOME CARE INSTRUCTIONS   Avoid all smoking, herbs, alcohol, and unprescribed drugs. These chemicals affect the formation and growth of the baby.  Follow your health care provider's instructions regarding Moore use. There are medicines that are either  safe or unsafe to take during pregnancy.  Exercise only as directed by your health care provider. Experiencing uterine cramps is a good sign to stop exercising.  Continue to eat regular, healthy meals.  Wear a good support bra for breast tenderness.  Do not use hot tubs, steam rooms, or saunas.  Wear your seat belt at all times when driving.  Avoid raw meat, uncooked cheese, cat litter boxes, and soil used by cats. These carry germs that can cause birth defects in the baby.  Take your prenatal vitamins.  Try taking a stool softener (if your health care provider approves) if you develop constipation. Eat more high-fiber foods, such as fresh vegetables or fruit and whole grains. Drink plenty of fluids to keep your urine clear or pale yellow.  Take warm sitz baths to soothe any pain or discomfort caused by hemorrhoids. Use hemorrhoid cream if your health care provider approves.  If you develop varicose veins, wear support hose. Elevate your feet for 15 minutes, 3-4 times a day. Limit salt in your diet.  Avoid heavy lifting, wear low heel shoes, and practice good posture.  Rest with your legs elevated if you have leg cramps or low back pain.  Visit your dentist if you have not gone yet during your pregnancy. Use a soft toothbrush to brush your teeth and be gentle when you floss.  A sexual relationship may be continued unless your health care provider directs you otherwise.  Continue to go to all your prenatal visits as directed by your health care provider. SEEK MEDICAL CARE IF:   You have dizziness.  You have mild pelvic cramps, pelvic pressure, or nagging pain in the abdominal area.  You have persistent nausea, vomiting, or diarrhea.  You have a bad smelling vaginal discharge.  You have pain with urination. SEEK IMMEDIATE MEDICAL CARE IF:   You have a fever.  You are leaking fluid from your vagina.  You have spotting or bleeding from your vagina.  You have severe  abdominal cramping or pain.  You have rapid weight gain or loss.  You have shortness of breath with chest pain.  You notice sudden or extreme swelling of your face, hands, ankles, feet, or legs.  You have not felt your baby move  in over an hour.  You have severe headaches that do not go away with Moore.  You have vision changes. Document Released: 02/23/2001 Document Revised: 03/06/2013 Document Reviewed: 05/02/2012 [facility] Patient Information 2015 [place], [place]. This information is not intended to replace advice given to you by your health care provider. Make sure you discuss any questions you have with your health care provider.

## 2019-06-19 ENCOUNTER — Ambulatory Visit (HOSPITAL_COMMUNITY)
Admission: RE | Admit: 2019-06-19 | Discharge: 2019-06-19 | Disposition: A | Discharge status: Home / Self Care | Payer: Medicare Other | Source: Ambulatory Visit | Attending: Obstetrics | Admitting: Obstetrics

## 2019-06-19 ENCOUNTER — Encounter (HOSPITAL_COMMUNITY): Payer: Self-pay | Admitting: *Deleted

## 2019-06-19 ENCOUNTER — Ambulatory Visit (HOSPITAL_COMMUNITY): Payer: Medicare Other | Admitting: *Deleted

## 2019-06-19 DIAGNOSIS — O289 Unspecified abnormal findings on antenatal screening of mother: Secondary | ICD-10-CM

## 2019-06-19 DIAGNOSIS — O280 Abnormal hematological finding on antenatal screening of mother: Secondary | ICD-10-CM

## 2019-06-19 DIAGNOSIS — O10012 Pre-existing essential hypertension complicating pregnancy, second trimester: Secondary | ICD-10-CM | POA: Diagnosis not present

## 2019-06-19 DIAGNOSIS — O34219 Maternal care for unspecified type scar from previous cesarean delivery: Secondary | ICD-10-CM | POA: Diagnosis not present

## 2019-06-19 DIAGNOSIS — D573 Sickle-cell trait: Secondary | ICD-10-CM | POA: Diagnosis present

## 2019-06-19 DIAGNOSIS — O0990 Supervision of high risk pregnancy, unspecified, unspecified trimester: Secondary | ICD-10-CM

## 2019-06-19 DIAGNOSIS — Z362 Encounter for other antenatal screening follow-up: Secondary | ICD-10-CM | POA: Diagnosis not present

## 2019-06-19 DIAGNOSIS — O09522 Supervision of elderly multigravida, second trimester: Secondary | ICD-10-CM

## 2019-06-19 DIAGNOSIS — O34212 Maternal care for vertical scar from previous cesarean delivery: Secondary | ICD-10-CM

## 2019-06-19 DIAGNOSIS — O99332 Smoking (tobacco) complicating pregnancy, second trimester: Secondary | ICD-10-CM | POA: Diagnosis not present

## 2019-06-19 DIAGNOSIS — Z3A27 27 weeks gestation of pregnancy: Secondary | ICD-10-CM

## 2019-06-20 LAB — URINE CULTURE: Organism ID, Bacteria: NO GROWTH

## 2019-06-26 ENCOUNTER — Encounter: Payer: Self-pay | Admitting: Obstetrics & Gynecology

## 2019-06-26 ENCOUNTER — Other Ambulatory Visit: Payer: Self-pay

## 2019-06-26 ENCOUNTER — Other Ambulatory Visit: Payer: Medicare Other

## 2019-06-26 ENCOUNTER — Ambulatory Visit (INDEPENDENT_AMBULATORY_CARE_PROVIDER_SITE_OTHER): Payer: Medicare Other | Admitting: Obstetrics & Gynecology

## 2019-06-26 VITALS — BP 142/93 | HR 92 | Wt 220.0 lb

## 2019-06-26 DIAGNOSIS — O0990 Supervision of high risk pregnancy, unspecified, unspecified trimester: Secondary | ICD-10-CM

## 2019-06-26 DIAGNOSIS — O10913 Unspecified pre-existing hypertension complicating pregnancy, third trimester: Secondary | ICD-10-CM

## 2019-06-26 DIAGNOSIS — Z131 Encounter for screening for diabetes mellitus: Secondary | ICD-10-CM

## 2019-06-26 DIAGNOSIS — Z1389 Encounter for screening for other disorder: Secondary | ICD-10-CM

## 2019-06-26 DIAGNOSIS — Z3A27 27 weeks gestation of pregnancy: Secondary | ICD-10-CM

## 2019-06-26 DIAGNOSIS — O10919 Unspecified pre-existing hypertension complicating pregnancy, unspecified trimester: Secondary | ICD-10-CM

## 2019-06-26 DIAGNOSIS — O0993 Supervision of high risk pregnancy, unspecified, third trimester: Secondary | ICD-10-CM

## 2019-06-26 DIAGNOSIS — Z3A28 28 weeks gestation of pregnancy: Secondary | ICD-10-CM

## 2019-06-26 DIAGNOSIS — Z331 Pregnant state, incidental: Secondary | ICD-10-CM

## 2019-06-26 NOTE — Progress Notes (Signed)
   HIGH-RISK PREGNANCY VISIT Patient name: Angel Moore MRN [id number]  Date of birth: [birth year] Chief Complaint:   Routine Prenatal Visit  History of Present Illness:   Angel Moore is a 39 y.o. G58P1001 female at [redacted]w[redacted]d with an Estimated Date of Delivery: 09/18/19 being seen today for ongoing management of a high-risk pregnancy complicated by chronic hypertension currently on labetalol 200 mg BID.  Today she reports no complaints.  Depression screen [facility] 2/9 03/20/2019  Decreased Interest 0  Down, Depressed, Hopeless 0  PHQ - 2 Score 0  Altered sleeping 0  Tired, decreased energy 1  Change in appetite 0  Feeling bad or failure about yourself  0  Trouble concentrating 0  Moving slowly or fidgety/restless 0  PHQ-9 Score 1    Contractions: Not present. Vag. Bleeding: None.  Movement: Present. denies leaking of fluid.  Review of Systems:   Pertinent items are noted in HPI Denies abnormal vaginal discharge w/ itching/odor/irritation, headaches, visual changes, shortness of breath, chest pain, abdominal pain, severe nausea/vomiting, or problems with urination or bowel movements unless otherwise stated above. Pertinent History Reviewed:  Reviewed past medical,surgical, social, obstetrical and family history.  Reviewed problem list, medications and allergies. Physical Assessment:   Vitals:   06/26/19 0929  BP: (!) 142/93  Pulse: 92  Weight: 220 lb (99.8 kg)  Body mass index is 38.97 kg/m.           Physical Examination:   General appearance: alert, well appearing, and in no distress  Mental status: alert, oriented to person, place, and time  Skin: warm & dry   Extremities: Edema: None    Cardiovascular: normal heart rate noted  Respiratory: normal respiratory effort, no distress  Abdomen: gravid, soft, non-tender  Pelvic: Cervical exam deferred         Fetal Status: Fetal Heart Rate (bpm): 155 Fundal Height: 29 cm Movement: Present    Fetal Surveillance Testing today: FHR 155    Chaperone: n/a    No results found for this or any previous visit (from the past 24 hour(s)).  Assessment & Plan:  1) High-risk pregnancy G2P1001 at [redacted]w[redacted]d with an Estimated Date of Delivery: 09/18/19   2) CHTN, stable, on labetalol 200 mg BID, did not take this am as fasting for PN2  3) Elevated T18 risk but normal sonogram stable  4) Previous C section, desires sterilization  Meds: No orders of the defined types were placed in this encounter.   Labs/procedures today: PN2  Treatment Plan:  Begin twice weekly visits at 32 weeks for fetal surveillance, plan repeat C section 6/30+ BTL  Reviewed: Preterm labor symptoms and general obstetric precautions including but not limited to vaginal bleeding, contractions, leaking of fluid and fetal movement were reviewed in detail with the patient.  All questions were answered. Has home bp cuff. Rx faxed to . Check bp weekly, let us know if >140/90.   Follow-up: Return in about 3 weeks (around 07/17/2019) for HROB.  Orders Placed This Encounter  Procedures  . POC Urinalysis Dipstick OB   Lazaro Arms  06/26/2019 9:44 AM

## 2019-06-27 LAB — GLUCOSE TOLERANCE, 2 HOURS W/ 1HR
Glucose, 1 hour: 173 mg/dL (ref 65–179)
Glucose, 2 hour: 99 mg/dL (ref 65–152)
Glucose, Fasting: 77 mg/dL (ref 65–91)

## 2019-06-27 LAB — RPR: RPR Ser Ql: NONREACTIVE

## 2019-06-27 LAB — CBC
Hematocrit: 35.2 % (ref 34.0–46.6)
Hemoglobin: 12.3 g/dL (ref 11.1–15.9)
MCH: 31.1 pg (ref 26.6–33.0)
MCHC: 34.9 g/dL (ref 31.5–35.7)
MCV: 89 fL (ref 79–97)
Platelets: 258 10*3/uL (ref 150–450)
RBC: 3.96 x10E6/uL (ref 3.77–5.28)
RDW: 17.1 % — ABNORMAL HIGH (ref 11.7–15.4)
WBC: 12.6 10*3/uL — ABNORMAL HIGH (ref 3.4–10.8)

## 2019-06-27 LAB — HIV ANTIBODY (ROUTINE TESTING W REFLEX): HIV Screen 4th Generation wRfx: NONREACTIVE

## 2019-06-27 LAB — ANTIBODY SCREEN: Antibody Screen: NEGATIVE

## 2019-07-17 ENCOUNTER — Ambulatory Visit (INDEPENDENT_AMBULATORY_CARE_PROVIDER_SITE_OTHER): Payer: Medicare Other | Admitting: Obstetrics & Gynecology

## 2019-07-17 ENCOUNTER — Other Ambulatory Visit: Payer: Self-pay

## 2019-07-17 ENCOUNTER — Encounter: Payer: Self-pay | Admitting: Obstetrics & Gynecology

## 2019-07-17 VITALS — BP 151/105 | HR 90 | Wt 223.0 lb

## 2019-07-17 DIAGNOSIS — O10919 Unspecified pre-existing hypertension complicating pregnancy, unspecified trimester: Secondary | ICD-10-CM

## 2019-07-17 DIAGNOSIS — Z1389 Encounter for screening for other disorder: Secondary | ICD-10-CM

## 2019-07-17 DIAGNOSIS — Z3A31 31 weeks gestation of pregnancy: Secondary | ICD-10-CM

## 2019-07-17 DIAGNOSIS — O34219 Maternal care for unspecified type scar from previous cesarean delivery: Secondary | ICD-10-CM

## 2019-07-17 DIAGNOSIS — Z331 Pregnant state, incidental: Secondary | ICD-10-CM

## 2019-07-17 DIAGNOSIS — O09523 Supervision of elderly multigravida, third trimester: Secondary | ICD-10-CM

## 2019-07-17 DIAGNOSIS — O10913 Unspecified pre-existing hypertension complicating pregnancy, third trimester: Secondary | ICD-10-CM

## 2019-07-17 LAB — POCT URINALYSIS DIPSTICK OB
Blood, UA: NEGATIVE
Glucose, UA: NEGATIVE
Ketones, UA: NEGATIVE
Leukocytes, UA: NEGATIVE
Nitrite, UA: NEGATIVE
POC,PROTEIN,UA: NEGATIVE

## 2019-07-17 NOTE — Progress Notes (Signed)
HIGH-RISK PREGNANCY VISIT Patient name: Angel Moore MRN [id number]  Date of birth: 11-04-1980 Chief Complaint:   Routine Prenatal Visit  History of Present Illness:   Angel Moore is a 39 y.o. G45P1001 female at [redacted]w[redacted]d with an Estimated Date of Delivery: 09/18/19 being seen today for ongoing management of a high-risk pregnancy complicated by [facility] on labetalol.  Today she reports no complaints.  Depression screen [facility] 2/9 06/26/2019 03/20/2019  Decreased Interest 0 0  Down, Depressed, Hopeless 0 0  PHQ - 2 Score 0 0  Altered sleeping 0 0  Tired, decreased energy 0 1  Change in appetite 0 0  Feeling bad or failure about yourself  0 0  Trouble concentrating 0 0  Moving slowly or fidgety/restless 0 0  Suicidal thoughts 0 -  PHQ-9 Score 0 1    Contractions: Not present. Vag. Bleeding: None.  Movement: Present. denies leaking of fluid.  Review of Systems:   Pertinent items are noted in HPI Denies abnormal vaginal discharge w/ itching/odor/irritation, headaches, visual changes, shortness of breath, chest pain, abdominal pain, severe nausea/vomiting, or problems with urination or bowel movements unless otherwise stated above. Pertinent History Reviewed:  Reviewed past medical,surgical, social, obstetrical and family history.  Reviewed problem list, medications and allergies. Physical Assessment:   Vitals:   07/17/19 1420 07/17/19 1421  BP: (!) 152/101 (!) 151/105  Pulse: 95 90  Weight: 223 lb (101.2 kg)   Body mass index is 39.5 kg/m.           Physical Examination:   General appearance: alert, well appearing, and in no distress  Mental status: alert, oriented to person, place, and time  Skin: warm & dry   Extremities: Edema: Trace    Cardiovascular: normal heart rate noted  Respiratory: normal respiratory effort, no distress  Abdomen: gravid, soft, non-tender  Pelvic: Cervical exam deferred         Fetal Status: Fetal Heart Rate (bpm): 150 Fundal Height: 32 cm Movement:  Present    Fetal Surveillance Testing today: FHR 150   Chaperone: n/a    Results for orders placed or performed in visit on 07/17/19 (from the past 24 hour(s))  POC Urinalysis Dipstick OB   Collection Time: 07/17/19  2:19 PM  Result Value Ref Range   Color, UA     Clarity, UA     Glucose, UA Negative Negative   Bilirubin, UA     Ketones, UA n    Spec Grav, UA     Blood, UA n    pH, UA     POC,PROTEIN,UA Negative Negative, Trace, Small (1+), Moderate (2+), Large (3+), 4+   Urobilinogen, UA     Nitrite, UA n    Leukocytes, UA Negative Negative   Appearance     Odor      Assessment & Plan:  1) High-risk pregnancy G2P1001 at [redacted]w[redacted]d with an Estimated Date of Delivery: 09/18/19   2) CHTN, unstable, has not taken her labetalol in 2 days because of nausea, no GERD sx, drinking fine, just has not thrown up, instructed if she can drink then she can take her labetalo 200 BID  3) Previous C section, desires repeat + BTL,   Meds: No orders of the defined types were placed in this encounter.   Labs/procedures today:   Treatment Plan:  Twice weekly surveillance for [facility], deliver at 39 weeks or as clinically indicated  Reviewed: Preterm labor symptoms and general obstetric precautions including but not limited  to vaginal bleeding, contractions, leaking of fluid and fetal movement were reviewed in detail with the patient.  All questions were answered. Has home bp cuff. Rx faxed to . Check bp weekly, let us know if >140/90.   Follow-up: Return in about 1 week (around 07/24/2019) for BPP/sono, HROB.  Orders Placed This Encounter  Procedures  . US Fetal BPP W/O Non Stress  . [place] UA Cord Doppler  . US OB Follow Up  . POC Urinalysis Dipstick OB   Mertie Clause Hallie Ertl  07/17/2019 2:42 PM

## 2019-07-24 ENCOUNTER — Other Ambulatory Visit: Payer: Self-pay

## 2019-07-24 ENCOUNTER — Encounter: Payer: Self-pay | Admitting: Obstetrics and Gynecology

## 2019-07-24 ENCOUNTER — Ambulatory Visit (INDEPENDENT_AMBULATORY_CARE_PROVIDER_SITE_OTHER): Payer: Medicare Other | Admitting: Obstetrics and Gynecology

## 2019-07-24 ENCOUNTER — Ambulatory Visit (INDEPENDENT_AMBULATORY_CARE_PROVIDER_SITE_OTHER): Payer: Medicare Other

## 2019-07-24 VITALS — BP 136/91 | HR 87

## 2019-07-24 DIAGNOSIS — Z1389 Encounter for screening for other disorder: Secondary | ICD-10-CM

## 2019-07-24 DIAGNOSIS — Z3A32 32 weeks gestation of pregnancy: Secondary | ICD-10-CM

## 2019-07-24 DIAGNOSIS — D573 Sickle-cell trait: Secondary | ICD-10-CM

## 2019-07-24 DIAGNOSIS — O0990 Supervision of high risk pregnancy, unspecified, unspecified trimester: Secondary | ICD-10-CM

## 2019-07-24 DIAGNOSIS — O10919 Unspecified pre-existing hypertension complicating pregnancy, unspecified trimester: Secondary | ICD-10-CM

## 2019-07-24 DIAGNOSIS — O10913 Unspecified pre-existing hypertension complicating pregnancy, third trimester: Secondary | ICD-10-CM

## 2019-07-24 DIAGNOSIS — O10013 Pre-existing essential hypertension complicating pregnancy, third trimester: Secondary | ICD-10-CM

## 2019-07-24 DIAGNOSIS — Z331 Pregnant state, incidental: Secondary | ICD-10-CM

## 2019-07-24 DIAGNOSIS — O09523 Supervision of elderly multigravida, third trimester: Secondary | ICD-10-CM

## 2019-07-24 LAB — POCT URINALYSIS DIPSTICK OB
Blood, UA: NEGATIVE
Glucose, UA: NEGATIVE
Ketones, UA: NEGATIVE
Leukocytes, UA: NEGATIVE
Nitrite, UA: NEGATIVE
POC,PROTEIN,UA: NEGATIVE

## 2019-07-24 NOTE — Progress Notes (Signed)
PATIENT ID: Angel Moore, female     DOB: October 22, 1980, 39 y.o.     MRN: [id number]     [facility] PREGNANCY VISIT PATIENT NAME: Angel Moore MRN [id number]  DOB: 07-17-1980 Chief Complaint:   Routine Prenatal Visit  History of Present Illness:   Angel Moore is a 39 y.o. G43P1001 female at [redacted]w[redacted]d with an Estimated Date of Delivery: 09/18/19 being seen today for ongoing management of a high-risk pregnancy complicated by chronic hypertension currently on labetalol.  Today she reports no complaints.   Depression screen [facility] 2/9 06/26/2019 03/20/2019  Decreased Interest 0 0  Down, Depressed, Hopeless 0 0  PHQ - 2 Score 0 0  Altered sleeping 0 0  Tired, decreased energy 0 1  Change in appetite 0 0  Feeling bad or failure about yourself  0 0  Trouble concentrating 0 0  Moving slowly or fidgety/restless 0 0  Suicidal thoughts 0 -  PHQ-9 Score 0 1    Contractions: Not present. Vag. Bleeding: None.  Movement: Present. denies leaking of fluid.   Review of Systems:   Pertinent items are noted in HPI Denies abnormal vaginal discharge w/ itching/odor/irritation, headaches, visual changes, shortness of breath, chest pain, abdominal pain, severe nausea/vomiting, or problems with urination or bowel movements unless otherwise stated above.  Pertinent History Reviewed:  Reviewed past medical,surgical, social, obstetrical and family history.  Reviewed problem list, medications and allergies.  Physical Assessment:   Vitals:   07/24/19 1539  BP: (!) 136/91  Pulse: 87  There is no height or weight on file to calculate BMI.           Physical Examination:   General appearance: alert, well appearing, and in no distress, oriented to person, place, and time and overweight  Mental status: alert, oriented to person, place, and time, normal mood, behavior, speech, dress, motor activity, and thought processes, affect appropriate to mood  Skin: warm & dry   Extremities: Edema: None    Cardiovascular:  normal heart rate noted  Respiratory: normal respiratory effort, no distress  Abdomen: gravid, soft, non-tender  Pelvic: Cervical exam deferred         Fetal Status:     Movement: Present    Fetal Surveillance Testing today: bpp 6/ 8 due to absent breathing but very active /vertex>> breech during exam   Chaperone: [employer]    No results found for this or any previous visit (from the past 24 hour(s)).   Assessment & Plan:  1) High-risk pregnancy G2P1001 at [redacted]w[redacted]d with an Estimated Date of Delivery: 09/18/19 due to [facility]  2) Breech fetal presentation, unstable  3) chtn, stable  Meds: No orders of the defined types were placed in this encounter.   Labs/procedures today: bpp-> NST  Treatment Plan:  NST revises the NST to a satisfactory score. Reviewed: Term labor symptoms and general obstetric precautions including but not limited to vaginal bleeding, contractions, leaking of fluid and fetal movement were reviewed in detail with the patient.  All questions were answered. Check bp weekly, let us know if >140/90.   Follow-up: Return in about 3 days (around 07/27/2019) for NST.   By signing my name below, I, [employer], attest that this documentation has been prepared under the direction and in the presence of Tilda Burrow, MD. Electronically Signed: Mal Misty Medical Scribe. 07/24/19. 4:03 PM.  I personally performed the services described in this documentation, which was SCRIBED in my presence. The recorded information has been  reviewed and considered accurate. It has been edited as necessary during review. Jonnie Kind, MD

## 2019-07-24 NOTE — Progress Notes (Signed)
[place] 32 wks,cephalic/breech,BPP 6/8 (no breathing),posterior placenta gr 3,fhr 152 bpm,LVEICF,AFI 17.5 cm,EFW 1987 g 55%,RI .60,.68,.61=58% S/D 2.68=51%

## 2019-07-27 ENCOUNTER — Ambulatory Visit (INDEPENDENT_AMBULATORY_CARE_PROVIDER_SITE_OTHER): Payer: Medicare Other | Admitting: *Deleted

## 2019-07-27 ENCOUNTER — Other Ambulatory Visit: Payer: Self-pay

## 2019-07-27 DIAGNOSIS — O0993 Supervision of high risk pregnancy, unspecified, third trimester: Secondary | ICD-10-CM | POA: Diagnosis not present

## 2019-07-27 DIAGNOSIS — O10013 Pre-existing essential hypertension complicating pregnancy, third trimester: Secondary | ICD-10-CM | POA: Diagnosis not present

## 2019-07-27 DIAGNOSIS — O0990 Supervision of high risk pregnancy, unspecified, unspecified trimester: Secondary | ICD-10-CM

## 2019-07-27 DIAGNOSIS — D573 Sickle-cell trait: Secondary | ICD-10-CM

## 2019-07-27 DIAGNOSIS — O10919 Unspecified pre-existing hypertension complicating pregnancy, unspecified trimester: Secondary | ICD-10-CM

## 2019-07-27 DIAGNOSIS — Z3A32 32 weeks gestation of pregnancy: Secondary | ICD-10-CM

## 2019-07-27 NOTE — Progress Notes (Signed)
   NURSE VISIT- NST  SUBJECTIVE:  Angel Moore is a 39 y.o. G2P1001 female at [redacted]w[redacted]d, here for a NST for pregnancy complicated by [facility].  She reports active fetal movement, contractions: irregular, vaginal bleeding: none, membranes: intact.   OBJECTIVE:  BP (!) 144/99   Pulse 88   Wt 223 lb (101.2 kg)   LMP 12/12/2018   BMI 39.50 kg/m   Appears well, no apparent distress  No results found for this or any previous visit (from the past 24 hour(s)).  NST: FHR baseline 145 bpm, Variability: moderate, Accelerations:present, Decelerations:  Absent= Cat 1/Reactive Toco: none   ASSESSMENT: G2P1001 at [redacted]w[redacted]d with CHTN NST reactive  PLAN: EFM strip reviewed by [name], CNM, [facility]   Recommendations: keep next appointment as scheduled    Jobe Marker  07/27/2019 1:45 PM

## 2019-07-30 ENCOUNTER — Telehealth: Payer: Self-pay | Admitting: Obstetrics & Gynecology

## 2019-07-30 NOTE — Telephone Encounter (Signed)
Patient called stating that if we are going to schedule her BPP at [facility] please to make the appointment today and give her a call because she has to call transportation ahead of time.

## 2019-07-31 ENCOUNTER — Telehealth: Payer: Self-pay | Admitting: *Deleted

## 2019-07-31 NOTE — Telephone Encounter (Signed)
Spoke to patient and informed her MFM did not have any BPP appointments for this week.  I spoke with Dr Despina Hidden who stated she could do two NST's this week.  Patient states she has to arrange for transportation for Tuesday and Friday.  Will call us in the am to let us know about tomorrow.  Friday appointment scheduled.

## 2019-08-03 ENCOUNTER — Other Ambulatory Visit: Payer: Medicare Other

## 2019-08-03 ENCOUNTER — Ambulatory Visit (INDEPENDENT_AMBULATORY_CARE_PROVIDER_SITE_OTHER): Payer: Medicare Other | Admitting: Obstetrics & Gynecology

## 2019-08-03 ENCOUNTER — Encounter: Payer: Self-pay | Admitting: Obstetrics & Gynecology

## 2019-08-03 VITALS — BP 135/94 | HR 100 | Wt 223.5 lb

## 2019-08-03 DIAGNOSIS — Z331 Pregnant state, incidental: Secondary | ICD-10-CM

## 2019-08-03 DIAGNOSIS — Z3A33 33 weeks gestation of pregnancy: Secondary | ICD-10-CM

## 2019-08-03 DIAGNOSIS — Z1389 Encounter for screening for other disorder: Secondary | ICD-10-CM

## 2019-08-03 DIAGNOSIS — O0990 Supervision of high risk pregnancy, unspecified, unspecified trimester: Secondary | ICD-10-CM

## 2019-08-03 DIAGNOSIS — D573 Sickle-cell trait: Secondary | ICD-10-CM

## 2019-08-03 DIAGNOSIS — O10913 Unspecified pre-existing hypertension complicating pregnancy, third trimester: Secondary | ICD-10-CM

## 2019-08-03 DIAGNOSIS — O10919 Unspecified pre-existing hypertension complicating pregnancy, unspecified trimester: Secondary | ICD-10-CM

## 2019-08-03 NOTE — Progress Notes (Signed)
   HIGH-RISK PREGNANCY VISIT Patient name: Angel Moore MRN [id number]  Date of birth: 18-Sep-1980 Chief Complaint:   High Risk Gestation (NST)  History of Present Illness:   Angel Moore is a 39 y.o. G54P1001 female at [redacted]w[redacted]d with an Estimated Date of Delivery: 09/18/19 being seen today for ongoing management of a high-risk pregnancy complicated by [facility] on labetalol 200 mg BID.  Today she reports no complaints.  Depression screen [facility] 2/9 06/26/2019 03/20/2019  Decreased Interest 0 0  Down, Depressed, Hopeless 0 0  PHQ - 2 Score 0 0  Altered sleeping 0 0  Tired, decreased energy 0 1  Change in appetite 0 0  Feeling bad or failure about yourself  0 0  Trouble concentrating 0 0  Moving slowly or fidgety/restless 0 0  Suicidal thoughts 0 -  PHQ-9 Score 0 1    Contractions: Not present. Vag. Bleeding: None.  Movement: Present. denies leaking of fluid.  Review of Systems:   Pertinent items are noted in HPI Denies abnormal vaginal discharge w/ itching/odor/irritation, headaches, visual changes, shortness of breath, chest pain, abdominal pain, severe nausea/vomiting, or problems with urination or bowel movements unless otherwise stated above. Pertinent History Reviewed:  Reviewed past medical,surgical, social, obstetrical and family history.  Reviewed problem list, medications and allergies. Physical Assessment:   Vitals:   08/03/19 1223  BP: (!) 135/94  Pulse: 100  Weight: 223 lb 8 oz (101.4 kg)  Body mass index is 39.59 kg/m.           Physical Examination:   General appearance: alert, well appearing, and in no distress  Mental status: alert, oriented to person, place, and time  Skin: warm & dry   Extremities: Edema: Trace    Cardiovascular: normal heart rate noted  Respiratory: normal respiratory effort, no distress  Abdomen: gravid, soft, non-tender  Pelvic: Cervical exam deferred         Fetal Status:     Movement: Present    Fetal Surveillance Testing today: Rective  NST  Angel Moore is at [redacted]w[redacted]d Estimated Date of Delivery: 09/18/19  NST being performed due to [facility]  Today the NST is Reactive  Fetal Monitoring:  Baseline: 135 bpm, Variability: Good {> 6 bpm) and Accelerations: Reactive   reactive  The accelerations are >15 bpm and more than 2 in 20 minutes  Final diagnosis:  Reactive NST  Lazaro Arms, MD      Chaperone: n/a    No results found for this or any previous visit (from the past 24 hour(s)).  Assessment & Plan:  1) High-risk pregnancy G2P1001 at [redacted]w[redacted]d with an Estimated Date of Delivery: 09/18/19   2) CHTN, stable  Previous C section for repeat + BTL  Meds: No orders of the defined types were placed in this encounter.   Labs/procedures today: NST  Treatment Plan:  Twice weekly surveillance, repeat section 39 weeks  Reviewed: Preterm labor symptoms and general obstetric precautions including but not limited to vaginal bleeding, contractions, leaking of fluid and fetal movement were reviewed in detail with the patient.  All questions were answered. Has home bp cuff. Rx faxed to . Check bp weekly, let us know if >140/90.   Follow-up: Return in about 4 days (around 08/07/2019) for NST, HROB.  Orders Placed This Encounter  Procedures  . POC Urinalysis Dipstick OB   Lazaro Arms  08/03/2019 12:58 PM

## 2019-08-07 ENCOUNTER — Ambulatory Visit (INDEPENDENT_AMBULATORY_CARE_PROVIDER_SITE_OTHER): Payer: Medicare Other | Admitting: *Deleted

## 2019-08-07 ENCOUNTER — Other Ambulatory Visit: Payer: Self-pay | Admitting: Obstetrics & Gynecology

## 2019-08-07 ENCOUNTER — Other Ambulatory Visit (INDEPENDENT_AMBULATORY_CARE_PROVIDER_SITE_OTHER): Payer: Medicare Other

## 2019-08-07 VITALS — BP 145/105 | HR 84 | Wt 226.0 lb

## 2019-08-07 DIAGNOSIS — Z3A34 34 weeks gestation of pregnancy: Secondary | ICD-10-CM

## 2019-08-07 DIAGNOSIS — D573 Sickle-cell trait: Secondary | ICD-10-CM

## 2019-08-07 DIAGNOSIS — O10013 Pre-existing essential hypertension complicating pregnancy, third trimester: Secondary | ICD-10-CM

## 2019-08-07 DIAGNOSIS — O0993 Supervision of high risk pregnancy, unspecified, third trimester: Secondary | ICD-10-CM

## 2019-08-07 DIAGNOSIS — O288 Other abnormal findings on antenatal screening of mother: Secondary | ICD-10-CM | POA: Diagnosis not present

## 2019-08-07 DIAGNOSIS — O10919 Unspecified pre-existing hypertension complicating pregnancy, unspecified trimester: Secondary | ICD-10-CM

## 2019-08-07 DIAGNOSIS — O0990 Supervision of high risk pregnancy, unspecified, unspecified trimester: Secondary | ICD-10-CM

## 2019-08-07 DIAGNOSIS — O10913 Unspecified pre-existing hypertension complicating pregnancy, third trimester: Secondary | ICD-10-CM

## 2019-08-07 DIAGNOSIS — Z1389 Encounter for screening for other disorder: Secondary | ICD-10-CM

## 2019-08-07 DIAGNOSIS — Z331 Pregnant state, incidental: Secondary | ICD-10-CM

## 2019-08-07 LAB — POCT URINALYSIS DIPSTICK OB
Blood, UA: NEGATIVE
Glucose, UA: NEGATIVE
Ketones, UA: NEGATIVE
Leukocytes, UA: NEGATIVE
Nitrite, UA: NEGATIVE
POC,PROTEIN,UA: NEGATIVE

## 2019-08-07 MED ORDER — LABETALOL HCL 200 MG PO TABS
400.0000 mg | ORAL_TABLET | Freq: Three times a day (TID) | ORAL | 6 refills | Status: DC
Start: 2019-08-07 — End: 2020-05-22

## 2019-08-07 NOTE — Progress Notes (Signed)
[place] 34 wks,cephalic,BPP 8/8,posterior placenta gr 3,AFI 18.7 cm,RI .62,.60,.64=70%,FHR 169 bpm

## 2019-08-08 NOTE — Progress Notes (Signed)
   NURSE VISIT- NST  SUBJECTIVE:  Angel Moore is a 39 y.o. G2P1001 female at [redacted]w[redacted]d, here for a NST for pregnancy complicated by [facility].  She reports active fetal movement, contractions: none, vaginal bleeding: none, membranes: intact.   OBJECTIVE:  BP (!) 145/105   Pulse 84   Wt 226 lb (102.5 kg)   LMP 12/12/2018   BMI 40.03 kg/m   Appears well, no apparent distress  Results for orders placed or performed in visit on 08/07/19 (from the past 24 hour(s))  POC Urinalysis Dipstick OB   Collection Time: 08/07/19  2:33 PM  Result Value Ref Range   Color, UA     Clarity, UA     Glucose, UA Negative Negative   Bilirubin, UA     Ketones, UA neg    Spec Grav, UA     Blood, UA neg    pH, UA     POC,PROTEIN,UA Negative Negative, Trace, Small (1+), Moderate (2+), Large (3+), 4+   Urobilinogen, UA     Nitrite, UA neg    Leukocytes, UA Negative Negative   Appearance     Odor      NST: FHR baseline 145 bpm, Variability: moderate, Accelerations:not present, Decelerations:  Absent= Cat 1/Reassuring but not reacitve Toco: occasional   ASSESSMENT: G2P1001 at [redacted]w[redacted]d with CHTN NST nonreactive BP elevated at visit today. Discussed with Dr Despina Hidden and will increase BP meds.  PLAN: EFM strip reviewed by Dr. Despina Hidden   Recommendations: work-in for BPP  BPP 8/8.      Debbe Odea Jaicob Dia  08/08/2019 8:30 AM

## 2019-08-10 ENCOUNTER — Other Ambulatory Visit: Payer: Self-pay

## 2019-08-10 ENCOUNTER — Other Ambulatory Visit: Payer: Medicare Other

## 2019-08-10 ENCOUNTER — Ambulatory Visit (INDEPENDENT_AMBULATORY_CARE_PROVIDER_SITE_OTHER): Payer: Medicare Other | Admitting: Obstetrics & Gynecology

## 2019-08-10 ENCOUNTER — Other Ambulatory Visit (INDEPENDENT_AMBULATORY_CARE_PROVIDER_SITE_OTHER): Payer: Medicare Other

## 2019-08-10 ENCOUNTER — Other Ambulatory Visit: Payer: Self-pay | Admitting: Obstetrics & Gynecology

## 2019-08-10 ENCOUNTER — Encounter: Payer: Self-pay | Admitting: Obstetrics & Gynecology

## 2019-08-10 VITALS — BP 140/100 | HR 86 | Wt 226.0 lb

## 2019-08-10 DIAGNOSIS — D573 Sickle-cell trait: Secondary | ICD-10-CM

## 2019-08-10 DIAGNOSIS — Z3A34 34 weeks gestation of pregnancy: Secondary | ICD-10-CM

## 2019-08-10 DIAGNOSIS — O0990 Supervision of high risk pregnancy, unspecified, unspecified trimester: Secondary | ICD-10-CM

## 2019-08-10 DIAGNOSIS — O10919 Unspecified pre-existing hypertension complicating pregnancy, unspecified trimester: Secondary | ICD-10-CM

## 2019-08-10 DIAGNOSIS — Z1389 Encounter for screening for other disorder: Secondary | ICD-10-CM

## 2019-08-10 DIAGNOSIS — Z331 Pregnant state, incidental: Secondary | ICD-10-CM

## 2019-08-10 DIAGNOSIS — O0993 Supervision of high risk pregnancy, unspecified, third trimester: Secondary | ICD-10-CM

## 2019-08-10 DIAGNOSIS — O288 Other abnormal findings on antenatal screening of mother: Secondary | ICD-10-CM

## 2019-08-10 DIAGNOSIS — O10013 Pre-existing essential hypertension complicating pregnancy, third trimester: Secondary | ICD-10-CM

## 2019-08-10 DIAGNOSIS — O368330 Maternal care for abnormalities of the fetal heart rate or rhythm, third trimester, not applicable or unspecified: Secondary | ICD-10-CM

## 2019-08-10 LAB — POCT URINALYSIS DIPSTICK OB
Blood, UA: NEGATIVE
Glucose, UA: NEGATIVE
Ketones, UA: NEGATIVE
Leukocytes, UA: NEGATIVE
Nitrite, UA: NEGATIVE
POC,PROTEIN,UA: NEGATIVE

## 2019-08-10 NOTE — Progress Notes (Signed)
HIGH-RISK PREGNANCY VISIT Patient name: Angel Moore MRN [id number]  Date of birth: Oct 29, 1980 Chief Complaint:   High Risk Gestation (nst room # 12/ spotted a little when wipe x1)  History of Present Illness:   Angel Moore is a 39 y.o. G67P1001 female at [redacted]w[redacted]d with an Estimated Date of Delivery: 09/18/19 being seen today for ongoing management of a high-risk pregnancy complicated by Angel Moore on labetalol.  Today she reports no complaints.  Depression screen [facility] 2/9 06/26/2019 03/20/2019  Decreased Interest 0 0  Down, Depressed, Hopeless 0 0  PHQ - 2 Score 0 0  Altered sleeping 0 0  Tired, decreased energy 0 1  Change in appetite 0 0  Feeling bad or failure about yourself  0 0  Trouble concentrating 0 0  Moving slowly or fidgety/restless 0 0  Suicidal thoughts 0 -  PHQ-9 Score 0 1    Contractions: Not present.  .  Movement: Present. denies leaking of fluid.  Review of Systems:   Pertinent items are noted in HPI Denies abnormal vaginal discharge w/ itching/odor/irritation, headaches, visual changes, shortness of breath, chest pain, abdominal pain, severe nausea/vomiting, or problems with urination or bowel movements unless otherwise stated above. Pertinent History Reviewed:  Reviewed past medical,surgical, social, obstetrical and family history.  Reviewed problem list, medications and allergies. Physical Assessment:   Vitals:   08/10/19 1204  BP: (!) 140/100  Pulse: 86  Weight: 226 lb (102.5 kg)  Body mass index is 40.03 kg/m.           Physical Examination:   General appearance: alert, well appearing, and in no distress  Mental status: alert, oriented to person, place, and time  Skin: warm & dry   Extremities: Edema: None    Cardiovascular: normal heart rate noted  Respiratory: normal respiratory effort, no distress  Abdomen: gravid, soft, non-tender  Pelvic: Cervical exam deferred         Fetal Status:     Movement: Present    Fetal Surveillance Testing today: Non  reactive NST  Angel Moore is at [redacted]w[redacted]d Estimated Date of Delivery: 09/18/19  NST being performed due to [facility]  Today the NST is Reactive  Fetal Monitoring:  Baseline: 130 bpm, Variability: Fair (1-6 bpm), Accelerations: non reactive and Decelerations: Absent   nonreactive  The re are not accelerations are >15 bpm and more than 2 in 20 minutes  Final diagnosis:  Non Reactive NST  BPP was performed 8/8  So surveillance 8/10  Angel Buff, MD      Chaperone: n/a    No results found for this or any previous visit (from the past 24 hour(s)).  Assessment & Plan:  1) High-risk pregnancy G2P1001 at [redacted]w[redacted]d with an Estimated Date of Delivery: 09/18/19   2) CHTN, stable, labetalol 400 BID  3) NR NST, stable  Meds: No orders of the defined types were placed in this encounter.   Labs/procedures today: as above  Treatment Plan:  twicew weekly surveillance repeat section at 39 weeks + BTL  Reviewed: Preterm labor symptoms and general obstetric precautions including but not limited to vaginal bleeding, contractions, leaking of fluid and fetal movement were reviewed in detail with the patient.  All questions were answered. Has home bp cuff. Rx faxed to . Check bp weekly, let us know if >140/90.   Follow-up: Return in about 4 days (around 08/14/2019) for keep scheduled.  Orders Placed This Encounter  Procedures  . POC Urinalysis Dipstick OB  Angel Moore  08/27/2019 7:11 PM

## 2019-08-10 NOTE — Progress Notes (Signed)
[place] 34+3 wks,cephalic,BPP 8/8,AFI 23.8 cm,posterior placenta gr 3,fhr 144 bpm

## 2019-08-14 ENCOUNTER — Ambulatory Visit (INDEPENDENT_AMBULATORY_CARE_PROVIDER_SITE_OTHER): Payer: Medicare Other | Admitting: Obstetrics & Gynecology

## 2019-08-14 ENCOUNTER — Ambulatory Visit (INDEPENDENT_AMBULATORY_CARE_PROVIDER_SITE_OTHER): Payer: Medicare Other

## 2019-08-14 ENCOUNTER — Encounter: Payer: Self-pay | Admitting: Obstetrics & Gynecology

## 2019-08-14 VITALS — BP 133/95 | HR 90 | Wt 225.0 lb

## 2019-08-14 DIAGNOSIS — O0993 Supervision of high risk pregnancy, unspecified, third trimester: Secondary | ICD-10-CM | POA: Diagnosis not present

## 2019-08-14 DIAGNOSIS — O10919 Unspecified pre-existing hypertension complicating pregnancy, unspecified trimester: Secondary | ICD-10-CM

## 2019-08-14 DIAGNOSIS — Z3A35 35 weeks gestation of pregnancy: Secondary | ICD-10-CM

## 2019-08-14 DIAGNOSIS — O0990 Supervision of high risk pregnancy, unspecified, unspecified trimester: Secondary | ICD-10-CM

## 2019-08-14 DIAGNOSIS — D573 Sickle-cell trait: Secondary | ICD-10-CM

## 2019-08-14 DIAGNOSIS — Z98891 History of uterine scar from previous surgery: Secondary | ICD-10-CM

## 2019-08-14 DIAGNOSIS — O10913 Unspecified pre-existing hypertension complicating pregnancy, third trimester: Secondary | ICD-10-CM

## 2019-08-14 DIAGNOSIS — Z331 Pregnant state, incidental: Secondary | ICD-10-CM

## 2019-08-14 DIAGNOSIS — Z1389 Encounter for screening for other disorder: Secondary | ICD-10-CM

## 2019-08-14 LAB — POCT URINALYSIS DIPSTICK OB
Blood, UA: NEGATIVE
Glucose, UA: NEGATIVE
Ketones, UA: NEGATIVE
Leukocytes, UA: NEGATIVE
Nitrite, UA: NEGATIVE

## 2019-08-14 MED ORDER — OMEPRAZOLE 20 MG PO CPDR
20.0000 mg | DELAYED_RELEASE_CAPSULE | Freq: Every day | ORAL | 6 refills | Status: DC
Start: 2019-08-14 — End: 2019-08-30

## 2019-08-14 NOTE — Progress Notes (Signed)
HIGH-RISK PREGNANCY VISIT Patient name: Angel Moore MRN [id number]  Date of birth: 1981-01-30 Chief Complaint:   High Risk Gestation ([place] today; spotting last night; none today)  History of Present Illness:   Angel Moore is a 39 y.o. G73P1001 female at [redacted]w[redacted]d with an Estimated Date of Delivery: 09/18/19 being seen today for ongoing management of a high-risk pregnancy complicated by [facility], on labetalol 400 TID currently.  Today she reports no complaints.  Depression screen [facility] 2/9 06/26/2019 03/20/2019  Decreased Interest 0 0  Down, Depressed, Hopeless 0 0  PHQ - 2 Score 0 0  Altered sleeping 0 0  Tired, decreased energy 0 1  Change in appetite 0 0  Feeling bad or failure about yourself  0 0  Trouble concentrating 0 0  Moving slowly or fidgety/restless 0 0  Suicidal thoughts 0 -  PHQ-9 Score 0 1    Contractions: Not present. Vag. Bleeding: Scant.  Movement: Present. denies leaking of fluid.  Review of Systems:   Pertinent items are noted in HPI Denies abnormal vaginal discharge w/ itching/odor/irritation, headaches, visual changes, shortness of breath, chest pain, abdominal pain, severe nausea/vomiting, or problems with urination or bowel movements unless otherwise stated above. Pertinent History Reviewed:  Reviewed past medical,surgical, social, obstetrical and family history.  Reviewed problem list, medications and allergies. Physical Assessment:   Vitals:   08/14/19 1421 08/14/19 1425  BP: (!) 146/100 (!) 133/95  Pulse: 94 90  Weight: 225 lb (102.1 kg)   Body mass index is 39.86 kg/m.           Physical Examination:   General appearance: alert, well appearing, and in no distress  Mental status: alert, oriented to person, place, and time  Skin: warm & dry   Extremities: Edema: Trace    Cardiovascular: normal heart rate noted  Respiratory: normal respiratory effort, no distress  Abdomen: gravid, soft, non-tender  Pelvic: Cervical exam deferred         Fetal Status:      Movement: Present    Fetal Surveillance Testing today: BPP 8/8 Doppler 52%   Chaperone: n/a    Results for orders placed or performed in visit on 08/14/19 (from the past 24 hour(s))  POC Urinalysis Dipstick OB   Collection Time: 08/14/19  2:28 PM  Result Value Ref Range   Color, UA     Clarity, UA     Glucose, UA Negative Negative   Bilirubin, UA     Ketones, UA neg    Spec Grav, UA     Blood, UA neg    pH, UA     POC,PROTEIN,UA Trace Negative, Trace, Small (1+), Moderate (2+), Large (3+), 4+   Urobilinogen, UA     Nitrite, UA neg    Leukocytes, UA Negative Negative   Appearance     Odor      Assessment & Plan:  1) High-risk pregnancy G2P1001 at [redacted]w[redacted]d with an Estimated Date of Delivery: 09/18/19   2) CHTN, stable, on labetalol 400 TID, 162 mg ASA  3) Previous C section, desires sterilization  Meds:  Meds ordered this encounter  Medications  . omeprazole (PRILOSEC) 20 MG capsule    Sig: Take 1 capsule (20 mg total) by mouth daily. 1 tablet a day    Dispense:  30 capsule    Refill:  6    Labs/procedures today: sonogram  Treatment Plan:  Twice weekly surveillance with repeat section 09/12/19  Reviewed: Preterm labor symptoms and general obstetric  precautions including but not limited to vaginal bleeding, contractions, leaking of fluid and fetal movement were reviewed in detail with the patient.  All questions were answered. Has home bp cuff. Rx faxed to . Check bp weekly, let us know if >140/90.   Follow-up: Return in about 3 days (around 08/17/2019) for NST, HROB.  Orders Placed This Encounter  Procedures  . POC Urinalysis Dipstick OB   Florian Buff  08/14/2019 3:04 PM

## 2019-08-14 NOTE — Progress Notes (Signed)
[place] 35 wks,cephalic,posterior placenta gr 3,AFI 17 cm,RI .54,.56,.63,.54=52%,FHR 150 bpm,BPP 8/8

## 2019-08-17 ENCOUNTER — Other Ambulatory Visit (INDEPENDENT_AMBULATORY_CARE_PROVIDER_SITE_OTHER): Payer: Medicare Other

## 2019-08-17 ENCOUNTER — Other Ambulatory Visit: Payer: Self-pay | Admitting: Obstetrics & Gynecology

## 2019-08-17 ENCOUNTER — Ambulatory Visit (INDEPENDENT_AMBULATORY_CARE_PROVIDER_SITE_OTHER): Payer: Medicare Other | Admitting: *Deleted

## 2019-08-17 VITALS — BP 141/96 | HR 92 | Wt 228.0 lb

## 2019-08-17 DIAGNOSIS — O0990 Supervision of high risk pregnancy, unspecified, unspecified trimester: Secondary | ICD-10-CM

## 2019-08-17 DIAGNOSIS — O288 Other abnormal findings on antenatal screening of mother: Secondary | ICD-10-CM

## 2019-08-17 DIAGNOSIS — Z1389 Encounter for screening for other disorder: Secondary | ICD-10-CM

## 2019-08-17 DIAGNOSIS — O0993 Supervision of high risk pregnancy, unspecified, third trimester: Secondary | ICD-10-CM

## 2019-08-17 DIAGNOSIS — O10913 Unspecified pre-existing hypertension complicating pregnancy, third trimester: Secondary | ICD-10-CM | POA: Diagnosis not present

## 2019-08-17 DIAGNOSIS — Z3A35 35 weeks gestation of pregnancy: Secondary | ICD-10-CM

## 2019-08-17 DIAGNOSIS — D573 Sickle-cell trait: Secondary | ICD-10-CM

## 2019-08-17 DIAGNOSIS — O10919 Unspecified pre-existing hypertension complicating pregnancy, unspecified trimester: Secondary | ICD-10-CM

## 2019-08-17 DIAGNOSIS — O10013 Pre-existing essential hypertension complicating pregnancy, third trimester: Secondary | ICD-10-CM | POA: Diagnosis not present

## 2019-08-17 DIAGNOSIS — Z331 Pregnant state, incidental: Secondary | ICD-10-CM

## 2019-08-17 LAB — POCT URINALYSIS DIPSTICK OB
Blood, UA: NEGATIVE
Glucose, UA: NEGATIVE
Ketones, UA: NEGATIVE
Leukocytes, UA: NEGATIVE
Nitrite, UA: NEGATIVE
POC,PROTEIN,UA: NEGATIVE

## 2019-08-17 NOTE — Progress Notes (Signed)
[place] 35+3 wks,cephalic,BPP 8/8,FHR 154 bpm,posterior placenta gr 3,AFI 18 cm

## 2019-08-17 NOTE — Progress Notes (Signed)
   NURSE VISIT- NST  SUBJECTIVE:  Angel Moore is a 39 y.o. G2P1001 female at [redacted]w[redacted]d, here for a NST for pregnancy complicated by [facility].  She reports active fetal movement, contractions: none, vaginal bleeding: none, membranes: intact.   OBJECTIVE:  BP (!) 141/96   Pulse 92   Wt 228 lb (103.4 kg)   LMP 12/12/2018   BMI 40.39 kg/m   Appears well, no apparent distress  Results for orders placed or performed in visit on 08/17/19 (from the past 24 hour(s))  POC Urinalysis Dipstick OB   Collection Time: 08/17/19  1:24 PM  Result Value Ref Range   Color, UA     Clarity, UA     Glucose, UA Negative Negative   Bilirubin, UA     Ketones, UA neg    Spec Grav, UA     Blood, UA neg    pH, UA     POC,PROTEIN,UA Negative Negative, Trace, Small (1+), Moderate (2+), Large (3+), 4+   Urobilinogen, UA     Nitrite, UA neg    Leukocytes, UA Negative Negative   Appearance     Odor      NST: FHR baseline145 bpm, Variability: moderate, Accelerations:present, Decelerations:  Absent= Cat 1/nonreactive but reassuring Toco: none   ASSESSMENT: G2P1001 at [redacted]w[redacted]d with CHTN NST nonreactive, BPP ordered  PLAN: EFM strip reviewed by Dr. Despina Hidden   Recommendations: work-in for BPP   BPP 8/8. Keep appt as scheduled for next week.   Angel Moore  08/17/2019 1:25 PM

## 2019-08-19 LAB — URINE CULTURE: Organism ID, Bacteria: NO GROWTH

## 2019-08-21 ENCOUNTER — Ambulatory Visit (INDEPENDENT_AMBULATORY_CARE_PROVIDER_SITE_OTHER): Payer: Medicare Other | Admitting: *Deleted

## 2019-08-21 VITALS — BP 154/100 | HR 94 | Wt 233.4 lb

## 2019-08-21 DIAGNOSIS — Z1389 Encounter for screening for other disorder: Secondary | ICD-10-CM

## 2019-08-21 DIAGNOSIS — O0990 Supervision of high risk pregnancy, unspecified, unspecified trimester: Secondary | ICD-10-CM

## 2019-08-21 DIAGNOSIS — Z3A36 36 weeks gestation of pregnancy: Secondary | ICD-10-CM

## 2019-08-21 DIAGNOSIS — O288 Other abnormal findings on antenatal screening of mother: Secondary | ICD-10-CM

## 2019-08-21 DIAGNOSIS — D573 Sickle-cell trait: Secondary | ICD-10-CM

## 2019-08-21 DIAGNOSIS — Z331 Pregnant state, incidental: Secondary | ICD-10-CM

## 2019-08-21 DIAGNOSIS — O10013 Pre-existing essential hypertension complicating pregnancy, third trimester: Secondary | ICD-10-CM

## 2019-08-21 LAB — POCT URINALYSIS DIPSTICK OB
Blood, UA: NEGATIVE
Glucose, UA: NEGATIVE
Ketones, UA: NEGATIVE
Leukocytes, UA: NEGATIVE
Nitrite, UA: NEGATIVE
POC,PROTEIN,UA: NEGATIVE

## 2019-08-21 NOTE — Progress Notes (Signed)
   NURSE VISIT- NST  SUBJECTIVE:  Angel Moore is a 39 y.o. G2P1001 female at [redacted]w[redacted]d, here for a NST for pregnancy complicated by [facility].  She reports active fetal movement, contractions: none, vaginal bleeding: none, membranes: intact.   OBJECTIVE:  BP (!) 154/100   Pulse 94   Wt 233 lb 6.4 oz (105.9 kg)   LMP 12/12/2018   BMI 41.34 kg/m   Appears well, no apparent distress  Results for orders placed or performed in visit on 08/21/19 (from the past 24 hour(s))  POC Urinalysis Dipstick OB   Collection Time: 08/21/19  1:43 PM  Result Value Ref Range   Color, UA     Clarity, UA     Glucose, UA Negative Negative   Bilirubin, UA     Ketones, UA neg    Spec Grav, UA     Blood, UA neg    pH, UA     POC,PROTEIN,UA Negative Negative, Trace, Small (1+), Moderate (2+), Large (3+), 4+   Urobilinogen, UA     Nitrite, UA neg    Leukocytes, UA Negative Negative   Appearance     Odor      NST: FHR baseline 145 bpm, Variability: moderate, Accelerations:present, Decelerations:  Absent= Cat 1/Reactive Toco: none   ASSESSMENT: G2P1001 at [redacted]w[redacted]d with CHTN NST reactive  PLAN: EFM strip reviewed by Dr. Despina Hidden   Recommendations: keep next appointment as scheduled    Annamarie Dawley  08/21/2019 4:11 PM

## 2019-08-24 ENCOUNTER — Other Ambulatory Visit: Payer: Medicare Other

## 2019-08-27 ENCOUNTER — Other Ambulatory Visit: Payer: Self-pay | Admitting: Obstetrics & Gynecology

## 2019-08-27 DIAGNOSIS — O10919 Unspecified pre-existing hypertension complicating pregnancy, unspecified trimester: Secondary | ICD-10-CM

## 2019-08-27 NOTE — Progress Notes (Signed)
Patient ID: Angel Moore, female   DOB: March 18, 1980, 39 y.o.   MRN: [id number]  This encounter was created in error - please disregard.

## 2019-08-28 ENCOUNTER — Inpatient Hospital Stay (HOSPITAL_COMMUNITY): Payer: Medicare Other | Admitting: Anesthesiology

## 2019-08-28 ENCOUNTER — Other Ambulatory Visit: Payer: Self-pay

## 2019-08-28 ENCOUNTER — Encounter: Payer: Self-pay | Admitting: Women's Health

## 2019-08-28 ENCOUNTER — Encounter (HOSPITAL_COMMUNITY)
Admission: AD | Disposition: A | Discharge status: Home / Self Care | Payer: Self-pay | Source: Home / Self Care | Attending: Family Medicine

## 2019-08-28 ENCOUNTER — Other Ambulatory Visit (HOSPITAL_COMMUNITY)
Admission: RE | Admit: 2019-08-28 | Discharge: 2019-08-28 | Disposition: A | Discharge status: Home / Self Care | Payer: Medicare Other | Source: Ambulatory Visit | Attending: Obstetrics and Gynecology | Admitting: Obstetrics and Gynecology

## 2019-08-28 ENCOUNTER — Encounter (HOSPITAL_COMMUNITY): Payer: Self-pay | Admitting: Obstetrics & Gynecology

## 2019-08-28 ENCOUNTER — Ambulatory Visit (INDEPENDENT_AMBULATORY_CARE_PROVIDER_SITE_OTHER): Payer: Medicare Other

## 2019-08-28 ENCOUNTER — Inpatient Hospital Stay (HOSPITAL_COMMUNITY)
Admission: AD | Admit: 2019-08-28 | Discharge: 2019-08-30 | DRG: 788 | Disposition: A | Discharge status: Home / Self Care | Payer: Medicare Other | Attending: Family Medicine | Admitting: Family Medicine

## 2019-08-28 ENCOUNTER — Ambulatory Visit (INDEPENDENT_AMBULATORY_CARE_PROVIDER_SITE_OTHER): Payer: Medicare Other | Admitting: Women's Health

## 2019-08-28 VITALS — BP 160/114 | HR 80 | Wt 234.5 lb

## 2019-08-28 DIAGNOSIS — Z7982 Long term (current) use of aspirin: Secondary | ICD-10-CM | POA: Diagnosis not present

## 2019-08-28 DIAGNOSIS — O99334 Smoking (tobacco) complicating childbirth: Secondary | ICD-10-CM | POA: Diagnosis present

## 2019-08-28 DIAGNOSIS — O10919 Unspecified pre-existing hypertension complicating pregnancy, unspecified trimester: Secondary | ICD-10-CM

## 2019-08-28 DIAGNOSIS — R030 Elevated blood-pressure reading, without diagnosis of hypertension: Secondary | ICD-10-CM | POA: Diagnosis present

## 2019-08-28 DIAGNOSIS — Z3A37 37 weeks gestation of pregnancy: Secondary | ICD-10-CM | POA: Insufficient documentation

## 2019-08-28 DIAGNOSIS — O10913 Unspecified pre-existing hypertension complicating pregnancy, third trimester: Secondary | ICD-10-CM | POA: Diagnosis not present

## 2019-08-28 DIAGNOSIS — F17210 Nicotine dependence, cigarettes, uncomplicated: Secondary | ICD-10-CM | POA: Diagnosis present

## 2019-08-28 DIAGNOSIS — O119 Pre-existing hypertension with pre-eclampsia, unspecified trimester: Secondary | ICD-10-CM | POA: Diagnosis present

## 2019-08-28 DIAGNOSIS — O1002 Pre-existing essential hypertension complicating childbirth: Secondary | ICD-10-CM | POA: Diagnosis present

## 2019-08-28 DIAGNOSIS — Z20822 Contact with and (suspected) exposure to covid-19: Secondary | ICD-10-CM | POA: Diagnosis present

## 2019-08-28 DIAGNOSIS — O34211 Maternal care for low transverse scar from previous cesarean delivery: Secondary | ICD-10-CM | POA: Diagnosis present

## 2019-08-28 DIAGNOSIS — D573 Sickle-cell trait: Secondary | ICD-10-CM

## 2019-08-28 DIAGNOSIS — O114 Pre-existing hypertension with pre-eclampsia, complicating childbirth: Principal | ICD-10-CM | POA: Diagnosis present

## 2019-08-28 DIAGNOSIS — O1414 Severe pre-eclampsia complicating childbirth: Secondary | ICD-10-CM | POA: Diagnosis not present

## 2019-08-28 DIAGNOSIS — Z23 Encounter for immunization: Secondary | ICD-10-CM | POA: Diagnosis not present

## 2019-08-28 DIAGNOSIS — Z98891 History of uterine scar from previous surgery: Secondary | ICD-10-CM

## 2019-08-28 DIAGNOSIS — O9902 Anemia complicating childbirth: Secondary | ICD-10-CM | POA: Diagnosis present

## 2019-08-28 DIAGNOSIS — O285 Abnormal chromosomal and genetic finding on antenatal screening of mother: Secondary | ICD-10-CM | POA: Diagnosis present

## 2019-08-28 DIAGNOSIS — O0990 Supervision of high risk pregnancy, unspecified, unspecified trimester: Secondary | ICD-10-CM

## 2019-08-28 DIAGNOSIS — O0993 Supervision of high risk pregnancy, unspecified, third trimester: Secondary | ICD-10-CM

## 2019-08-28 LAB — ABO/RH: ABO/RH(D): O POS

## 2019-08-28 LAB — PROTEIN / CREATININE RATIO, URINE
Creatinine, Urine: 27.7 mg/dL
Protein Creatinine Ratio: 0.29 mg/mg{Cre} — ABNORMAL HIGH (ref 0.00–0.15)
Total Protein, Urine: 8 mg/dL

## 2019-08-28 LAB — CBC
HCT: 39.4 % (ref 36.0–46.0)
Hemoglobin: 13.6 g/dL (ref 12.0–15.0)
MCH: 32.8 pg (ref 26.0–34.0)
MCHC: 34.5 g/dL (ref 30.0–36.0)
MCV: 94.9 fL (ref 80.0–100.0)
Platelets: 277 10*3/uL (ref 150–400)
RBC: 4.15 MIL/uL (ref 3.87–5.11)
RDW: 13.4 % (ref 11.5–15.5)
WBC: 10.9 10*3/uL — ABNORMAL HIGH (ref 4.0–10.5)
nRBC: 0 % (ref 0.0–0.2)

## 2019-08-28 LAB — COMPREHENSIVE METABOLIC PANEL
ALT: 37 U/L (ref 0–44)
AST: 40 U/L (ref 15–41)
Albumin: 2.7 g/dL — ABNORMAL LOW (ref 3.5–5.0)
Alkaline Phosphatase: 201 U/L — ABNORMAL HIGH (ref 38–126)
Anion gap: 8 (ref 5–15)
BUN: 7 mg/dL (ref 6–20)
CO2: 22 mmol/L (ref 22–32)
Calcium: 8.6 mg/dL — ABNORMAL LOW (ref 8.9–10.3)
Chloride: 110 mmol/L (ref 98–111)
Creatinine, Ser: 0.55 mg/dL (ref 0.44–1.00)
GFR calc Af Amer: >60 mL/min (ref >60)
GFR calc non Af Amer: >60 mL/min (ref >60)
Glucose, Bld: 79 mg/dL (ref 70–99)
Potassium: 3.9 mmol/L (ref 3.5–5.1)
Sodium: 140 mmol/L (ref 135–145)
Total Bilirubin: 1 mg/dL (ref 0.3–1.2)
Total Protein: 6 g/dL — ABNORMAL LOW (ref 6.5–8.1)

## 2019-08-28 LAB — TYPE AND SCREEN
ABO/RH(D): O POS
Antibody Screen: NEGATIVE

## 2019-08-28 LAB — SARS CORONAVIRUS 2 BY RT PCR (HOSPITAL ORDER, PERFORMED IN ~~LOC~~ HOSPITAL LAB): SARS Coronavirus 2: NEGATIVE

## 2019-08-28 SURGERY — Surgical Case
Anesthesia: Spinal | Wound class: Clean Contaminated

## 2019-08-28 MED ORDER — LABETALOL HCL 200 MG PO TABS
400.0000 mg | ORAL_TABLET | Freq: Three times a day (TID) | ORAL | Status: DC
  Administered 2019-08-28 – 2019-08-29 (×2): 400 mg via ORAL
  Filled 2019-08-28 (×5): qty 2

## 2019-08-28 MED ORDER — MAGNESIUM SULFATE 40 GM/1000ML IV SOLN
2.0000 g/h | INTRAVENOUS | Status: DC
  Administered 2019-08-28: 2 g/h via INTRAVENOUS
  Filled 2019-08-28: qty 2000

## 2019-08-28 MED ORDER — FENTANYL CITRATE (PF) 100 MCG/2ML IJ SOLN
INTRAMUSCULAR | Status: DC | PRN
  Administered 2019-08-28: 15 ug via INTRAVENOUS

## 2019-08-28 MED ORDER — SCOPOLAMINE 1 MG/3DAYS TD PT72
1.0000 | MEDICATED_PATCH | Freq: Once | TRANSDERMAL | Status: DC
  Administered 2019-08-29: 1.5 mg via TRANSDERMAL
  Filled 2019-08-28: qty 1

## 2019-08-28 MED ORDER — FENTANYL CITRATE (PF) 100 MCG/2ML IJ SOLN: 25.0000 ug | INTRAMUSCULAR | Status: DC | PRN

## 2019-08-28 MED ORDER — LABETALOL HCL 5 MG/ML IV SOLN
80.0000 mg | INTRAVENOUS | Status: DC | PRN
  Administered 2019-08-28: 80 mg via INTRAVENOUS
  Filled 2019-08-28: qty 16

## 2019-08-28 MED ORDER — NALBUPHINE HCL 10 MG/ML IJ SOLN: 5.0000 mg | INTRAMUSCULAR | Status: DC | PRN

## 2019-08-28 MED ORDER — BUPIVACAINE IN DEXTROSE 0.75-8.25 % IT SOLN
INTRATHECAL | Status: DC | PRN
  Administered 2019-08-28: 1.5 mL via INTRATHECAL

## 2019-08-28 MED ORDER — PHENYLEPHRINE HCL-NACL 20-0.9 MG/250ML-% IV SOLN
INTRAVENOUS | Status: DC | PRN
  Administered 2019-08-28: 60 ug/min via INTRAVENOUS

## 2019-08-28 MED ORDER — LACTATED RINGERS IV SOLN: INTRAVENOUS | Status: DC

## 2019-08-28 MED ORDER — ENOXAPARIN SODIUM 40 MG/0.4ML ~~LOC~~ SOLN: 40.0000 mg | SUBCUTANEOUS | Status: DC

## 2019-08-28 MED ORDER — DEXAMETHASONE SODIUM PHOSPHATE 4 MG/ML IJ SOLN
INTRAMUSCULAR | Status: DC | PRN
Start: 2019-08-28 — End: 2019-08-28
  Administered 2019-08-28: 4 mg via INTRAVENOUS

## 2019-08-28 MED ORDER — NALOXONE HCL 4 MG/10ML IJ SOLN
1.0000 ug/kg/h | INTRAVENOUS | Status: DC | PRN
  Filled 2019-08-28: qty 5

## 2019-08-28 MED ORDER — IBUPROFEN 800 MG PO TABS
800.0000 mg | ORAL_TABLET | Freq: Four times a day (QID) | ORAL | Status: DC
  Administered 2019-08-30 (×2): 800 mg via ORAL
  Filled 2019-08-28 (×2): qty 1

## 2019-08-28 MED ORDER — DIBUCAINE (PERIANAL) 1 % EX OINT: 1.0000 "application " | TOPICAL_OINTMENT | CUTANEOUS | Status: DC | PRN

## 2019-08-28 MED ORDER — OXYTOCIN-SODIUM CHLORIDE 30-0.9 UT/500ML-% IV SOLN: 2.5000 [IU]/h | INTRAVENOUS | Status: AC

## 2019-08-28 MED ORDER — DIPHENHYDRAMINE HCL 50 MG/ML IJ SOLN
12.5000 mg | INTRAMUSCULAR | Status: DC | PRN
  Administered 2019-08-28: 12.5 mg via INTRAVENOUS
  Filled 2019-08-28: qty 1

## 2019-08-28 MED ORDER — ONDANSETRON HCL 4 MG/2ML IJ SOLN
INTRAMUSCULAR | Status: AC
  Filled 2019-08-28: qty 2

## 2019-08-28 MED ORDER — ACETAMINOPHEN 325 MG PO TABS: 325.0000 mg | ORAL_TABLET | Freq: Once | ORAL | Status: DC | PRN

## 2019-08-28 MED ORDER — SIMETHICONE 80 MG PO CHEW: 80.0000 mg | CHEWABLE_TABLET | ORAL | Status: DC | PRN

## 2019-08-28 MED ORDER — MEPERIDINE HCL 25 MG/ML IJ SOLN: 6.2500 mg | INTRAMUSCULAR | Status: DC | PRN

## 2019-08-28 MED ORDER — KETOROLAC TROMETHAMINE 30 MG/ML IJ SOLN
INTRAMUSCULAR | Status: AC
  Filled 2019-08-28: qty 1

## 2019-08-28 MED ORDER — ENOXAPARIN SODIUM 60 MG/0.6ML ~~LOC~~ SOLN
0.5000 mg/kg | SUBCUTANEOUS | Status: DC
  Administered 2019-08-29: 55 mg via SUBCUTANEOUS
  Filled 2019-08-28: qty 0.6

## 2019-08-28 MED ORDER — ACETAMINOPHEN 10 MG/ML IV SOLN: 1000.0000 mg | Freq: Once | INTRAVENOUS | Status: DC | PRN

## 2019-08-28 MED ORDER — NALOXONE HCL 0.4 MG/ML IJ SOLN: 0.4000 mg | INTRAMUSCULAR | Status: DC | PRN

## 2019-08-28 MED ORDER — SIMETHICONE 80 MG PO CHEW
80.0000 mg | CHEWABLE_TABLET | ORAL | Status: DC
  Administered 2019-08-29 (×2): 80 mg via ORAL
  Filled 2019-08-28 (×2): qty 1

## 2019-08-28 MED ORDER — FAMOTIDINE IN NACL 20-0.9 MG/50ML-% IV SOLN
20.0000 mg | Freq: Once | INTRAVENOUS | Status: AC
  Administered 2019-08-28: 20 mg via INTRAVENOUS
  Filled 2019-08-28: qty 50

## 2019-08-28 MED ORDER — EPHEDRINE 5 MG/ML INJ
INTRAVENOUS | Status: AC
  Filled 2019-08-28: qty 10

## 2019-08-28 MED ORDER — SIMETHICONE 80 MG PO CHEW
80.0000 mg | CHEWABLE_TABLET | Freq: Three times a day (TID) | ORAL | Status: DC
  Administered 2019-08-29 – 2019-08-30 (×5): 80 mg via ORAL
  Filled 2019-08-28 (×5): qty 1

## 2019-08-28 MED ORDER — ONDANSETRON HCL 4 MG/2ML IJ SOLN: 4.0000 mg | Freq: Three times a day (TID) | INTRAMUSCULAR | Status: DC | PRN

## 2019-08-28 MED ORDER — MORPHINE SULFATE (PF) 0.5 MG/ML IJ SOLN
INTRAMUSCULAR | Status: DC | PRN
  Administered 2019-08-28: .15 mg via EPIDURAL

## 2019-08-28 MED ORDER — PHENYLEPHRINE HCL-NACL 20-0.9 MG/250ML-% IV SOLN
INTRAVENOUS | Status: AC
  Filled 2019-08-28: qty 250

## 2019-08-28 MED ORDER — OXYTOCIN-SODIUM CHLORIDE 30-0.9 UT/500ML-% IV SOLN
INTRAVENOUS | Status: AC
  Filled 2019-08-28: qty 500

## 2019-08-28 MED ORDER — KETOROLAC TROMETHAMINE 30 MG/ML IJ SOLN
30.0000 mg | Freq: Four times a day (QID) | INTRAMUSCULAR | Status: AC
  Administered 2019-08-29 (×4): 30 mg via INTRAVENOUS
  Filled 2019-08-28 (×4): qty 1

## 2019-08-28 MED ORDER — DEXAMETHASONE SODIUM PHOSPHATE 4 MG/ML IJ SOLN
INTRAMUSCULAR | Status: AC
  Filled 2019-08-28: qty 1

## 2019-08-28 MED ORDER — SENNOSIDES-DOCUSATE SODIUM 8.6-50 MG PO TABS
2.0000 | ORAL_TABLET | ORAL | Status: DC
  Administered 2019-08-29 (×2): 2 via ORAL
  Filled 2019-08-28 (×2): qty 2

## 2019-08-28 MED ORDER — SODIUM CHLORIDE 0.9% FLUSH: 3.0000 mL | INTRAVENOUS | Status: DC | PRN

## 2019-08-28 MED ORDER — ACETAMINOPHEN 160 MG/5ML PO SOLN: 325.0000 mg | Freq: Once | ORAL | Status: DC | PRN

## 2019-08-28 MED ORDER — ONDANSETRON HCL 4 MG/2ML IJ SOLN
INTRAMUSCULAR | Status: DC | PRN
  Administered 2019-08-28: 4 mg via INTRAVENOUS

## 2019-08-28 MED ORDER — KETOROLAC TROMETHAMINE 30 MG/ML IJ SOLN: 30.0000 mg | Freq: Four times a day (QID) | INTRAMUSCULAR | Status: AC | PRN

## 2019-08-28 MED ORDER — WITCH HAZEL-GLYCERIN EX PADS: 1.0000 "application " | MEDICATED_PAD | CUTANEOUS | Status: DC | PRN

## 2019-08-28 MED ORDER — HYDRALAZINE HCL 20 MG/ML IJ SOLN
10.0000 mg | INTRAMUSCULAR | Status: DC | PRN
  Administered 2019-08-28: 10 mg via INTRAVENOUS
  Filled 2019-08-28: qty 1

## 2019-08-28 MED ORDER — NALBUPHINE HCL 10 MG/ML IJ SOLN: 5.0000 mg | Freq: Once | INTRAMUSCULAR | Status: DC | PRN

## 2019-08-28 MED ORDER — DIPHENHYDRAMINE HCL 25 MG PO CAPS
25.0000 mg | ORAL_CAPSULE | Freq: Four times a day (QID) | ORAL | Status: DC | PRN
  Administered 2019-08-29: 25 mg via ORAL
  Filled 2019-08-28: qty 1

## 2019-08-28 MED ORDER — SODIUM CHLORIDE 0.9 % IR SOLN
Status: DC | PRN
  Administered 2019-08-28: 1

## 2019-08-28 MED ORDER — SOD CITRATE-CITRIC ACID 500-334 MG/5ML PO SOLN
30.0000 mL | ORAL | Status: AC
  Administered 2019-08-28: 30 mL via ORAL
  Filled 2019-08-28: qty 30

## 2019-08-28 MED ORDER — DIPHENHYDRAMINE HCL 25 MG PO CAPS: 25.0000 mg | ORAL_CAPSULE | ORAL | Status: DC | PRN

## 2019-08-28 MED ORDER — TETANUS-DIPHTH-ACELL PERTUSSIS 5-2.5-18.5 LF-MCG/0.5 IM SUSP: 0.5000 mL | Freq: Once | INTRAMUSCULAR | Status: DC

## 2019-08-28 MED ORDER — LACTATED RINGERS IV SOLN: INTRAVENOUS | Status: DC | PRN

## 2019-08-28 MED ORDER — MORPHINE SULFATE (PF) 0.5 MG/ML IJ SOLN
INTRAMUSCULAR | Status: AC
  Filled 2019-08-28: qty 10

## 2019-08-28 MED ORDER — MENTHOL 3 MG MT LOZG: 1.0000 | LOZENGE | OROMUCOSAL | Status: DC | PRN

## 2019-08-28 MED ORDER — COCONUT OIL OIL: 1.0000 "application " | TOPICAL_OIL | Status: DC | PRN

## 2019-08-28 MED ORDER — PRENATAL MULTIVITAMIN CH
1.0000 | ORAL_TABLET | Freq: Every day | ORAL | Status: DC
  Administered 2019-08-29 – 2019-08-30 (×2): 1 via ORAL
  Filled 2019-08-28 (×2): qty 1

## 2019-08-28 MED ORDER — MAGNESIUM SULFATE 40 GM/1000ML IV SOLN
2.0000 g/h | INTRAVENOUS | Status: AC
  Administered 2019-08-28 – 2019-08-29 (×2): 2 g/h via INTRAVENOUS
  Filled 2019-08-28: qty 1000

## 2019-08-28 MED ORDER — OXYCODONE-ACETAMINOPHEN 5-325 MG PO TABS
1.0000 | ORAL_TABLET | ORAL | Status: DC | PRN
  Administered 2019-08-30: 2 via ORAL
  Filled 2019-08-28: qty 2

## 2019-08-28 MED ORDER — LABETALOL HCL 5 MG/ML IV SOLN
40.0000 mg | INTRAVENOUS | Status: DC | PRN
  Administered 2019-08-28: 40 mg via INTRAVENOUS
  Filled 2019-08-28: qty 8

## 2019-08-28 MED ORDER — PROMETHAZINE HCL 25 MG/ML IJ SOLN: 6.2500 mg | INTRAMUSCULAR | Status: DC | PRN

## 2019-08-28 MED ORDER — LABETALOL HCL 5 MG/ML IV SOLN
20.0000 mg | INTRAVENOUS | Status: DC | PRN
  Administered 2019-08-28: 20 mg via INTRAVENOUS
  Filled 2019-08-28: qty 4

## 2019-08-28 MED ORDER — MAGNESIUM SULFATE BOLUS VIA INFUSION
4.0000 g | Freq: Once | INTRAVENOUS | Status: AC
  Administered 2019-08-28: 4 g via INTRAVENOUS
  Filled 2019-08-28: qty 1000

## 2019-08-28 MED ORDER — CEFAZOLIN SODIUM-DEXTROSE 2-4 GM/100ML-% IV SOLN
2.0000 g | INTRAVENOUS | Status: DC
  Administered 2019-08-28: 2 g via INTRAVENOUS
  Filled 2019-08-28: qty 100

## 2019-08-28 MED ORDER — OXYTOCIN-SODIUM CHLORIDE 30-0.9 UT/500ML-% IV SOLN
INTRAVENOUS | Status: DC | PRN
  Administered 2019-08-28: 300 mL via INTRAVENOUS

## 2019-08-28 MED ORDER — EPHEDRINE SULFATE 50 MG/ML IJ SOLN
INTRAMUSCULAR | Status: DC | PRN
  Administered 2019-08-28 (×2): 10 mg via INTRAVENOUS

## 2019-08-28 MED ORDER — ZOLPIDEM TARTRATE 5 MG PO TABS: 5.0000 mg | ORAL_TABLET | Freq: Every evening | ORAL | Status: DC | PRN

## 2019-08-28 MED ORDER — FENTANYL CITRATE (PF) 100 MCG/2ML IJ SOLN
INTRAMUSCULAR | Status: AC
  Filled 2019-08-28: qty 2

## 2019-08-28 MED ORDER — KETOROLAC TROMETHAMINE 30 MG/ML IJ SOLN
30.0000 mg | Freq: Four times a day (QID) | INTRAMUSCULAR | Status: AC | PRN
  Administered 2019-08-28: 30 mg via INTRAVENOUS

## 2019-08-28 MED ORDER — LABETALOL HCL 100 MG PO TABS: 400.0000 mg | ORAL_TABLET | Freq: Three times a day (TID) | ORAL | Status: DC

## 2019-08-28 SURGICAL SUPPLY — 43 items
BENZOIN TINCTURE PRP APPL 2/3 (GAUZE/BANDAGES/DRESSINGS) ×3 IMPLANT
CLAMP CORD UMBIL (MISCELLANEOUS) IMPLANT
CLOTH BEACON ORANGE TIMEOUT ST (SAFETY) ×3 IMPLANT
DERMABOND ADVANCED (GAUZE/BANDAGES/DRESSINGS) ×2
DERMABOND ADVANCED .7 DNX12 (GAUZE/BANDAGES/DRESSINGS) ×4 IMPLANT
DRSG OPSITE POSTOP 4X10 (GAUZE/BANDAGES/DRESSINGS) ×3 IMPLANT
ELECT REM PT RETURN 9FT ADLT (ELECTROSURGICAL) ×3
ELECTRODE REM PT RTRN 9FT ADLT (ELECTROSURGICAL) ×2 IMPLANT
EXTRACTOR VACUUM BELL STYLE (SUCTIONS) IMPLANT
GAUZE SPONGE 4X4 12PLY STRL LF (GAUZE/BANDAGES/DRESSINGS) ×6 IMPLANT
GLOVE BIOGEL PI IND STRL 7.0 (GLOVE) ×2 IMPLANT
GLOVE BIOGEL PI IND STRL 8 (GLOVE) ×2 IMPLANT
GLOVE BIOGEL PI INDICATOR 7.0 (GLOVE) ×1
GLOVE BIOGEL PI INDICATOR 8 (GLOVE) ×1
GLOVE ECLIPSE 8.0 STRL XLNG CF (GLOVE) ×3 IMPLANT
GOWN STRL REUS W/TWL LRG LVL3 (GOWN DISPOSABLE) ×6 IMPLANT
KIT ABG SYR 3ML LUER SLIP (SYRINGE) ×3 IMPLANT
NEEDLE HYPO 18GX1.5 BLUNT FILL (NEEDLE) ×3 IMPLANT
NEEDLE HYPO 22GX1.5 SAFETY (NEEDLE) ×3 IMPLANT
NEEDLE HYPO 25X5/8 SAFETYGLIDE (NEEDLE) ×3 IMPLANT
NS IRRIG 1000ML POUR BTL (IV SOLUTION) ×3 IMPLANT
PACK C SECTION WH (CUSTOM PROCEDURE TRAY) ×3 IMPLANT
PAD ABD 7.5X8 STRL (GAUZE/BANDAGES/DRESSINGS) ×6 IMPLANT
PAD OB MATERNITY 4.3X12.25 (PERSONAL CARE ITEMS) ×3 IMPLANT
PENCIL SMOKE EVAC W/HOLSTER (ELECTROSURGICAL) ×3 IMPLANT
RTRCTR C-SECT PINK 25CM LRG (MISCELLANEOUS) IMPLANT
STRIP CLOSURE SKIN 1/2X4 (GAUZE/BANDAGES/DRESSINGS) ×3 IMPLANT
SUT CHROMIC 0 CT 1 (SUTURE) ×3 IMPLANT
SUT CHROMIC 1 CTX 36 (SUTURE) ×3 IMPLANT
SUT MNCRL 0 VIOLET CTX 36 (SUTURE) ×4 IMPLANT
SUT MONOCRYL 0 CTX 36 (SUTURE) ×6
SUT PLAIN 2 0 (SUTURE) ×6
SUT PLAIN 2 0 XLH (SUTURE) IMPLANT
SUT PLAIN ABS 2-0 CT1 27XMFL (SUTURE) ×4 IMPLANT
SUT VIC AB 0 CTX 36 (SUTURE) ×3
SUT VIC AB 0 CTX36XBRD ANBCTRL (SUTURE) ×2 IMPLANT
SUT VIC AB 2-0 CT1 27 (SUTURE) ×3
SUT VIC AB 2-0 CT1 TAPERPNT 27 (SUTURE) ×2 IMPLANT
SUT VIC AB 4-0 KS 27 (SUTURE) IMPLANT
SYR 20CC LL (SYRINGE) ×6 IMPLANT
TOWEL OR 17X24 6PK STRL BLUE (TOWEL DISPOSABLE) ×3 IMPLANT
TRAY FOLEY W/BAG SLVR 14FR LF (SET/KITS/TRAYS/PACK) IMPLANT
WATER STERILE IRR 1000ML POUR (IV SOLUTION) ×6 IMPLANT

## 2019-08-28 NOTE — H&P (Signed)
LABOR AND DELIVERY ADMISSION HISTORY AND PHYSICAL NOTE  Angel Moore is a 39 y.o. female G2P1001 with IUP at [redacted]w[redacted]d by 6wk [place] presenting for elevated BP.  Patient with known hx of cHTN on labetalol 400mg  TID. Goes to [facility] and seen earlier today for routine OB visit, noted to have severe range BP's and sent to MAU for evaluation.   She last ate food around 1300 and had some liquids around 1430.   She denies HA, vision changes, chest pain, SOB, RUQ pain. Has some mild LE edema.   She reports positive fetal movement. She denies leakage of fluid or vaginal bleeding.   She plans on bottle feeding. She requests BTL w [facility] for birth control.  Prenatal History/Complications: PNC at [facility]  Sono:  @[redacted]w[redacted]d , CWD, normal anatomy, cephalic presentation, posterior placenta, 55%ile, EFW [id number]  Pregnancy complications:  - cHTN on labetalol 400mg  TID - hx of 1 prior cesarean - unwanted fertility  Past Medical History: Past Medical History:  Diagnosis Date  . Anemia   . Heart murmur   . Hypertension   . Stress headaches   . Tobacco abuse    1 PK/WEEK    Past Surgical History: Past Surgical History:  Procedure Laterality Date  . BIOPSY  01/31/2018   Procedure: BIOPSY;  Surgeon: Danie Binder, MD;  Location: [facility];  Service: Endoscopy;;  Gastric  . CESAREAN SECTION  08/2007  . CHOLECYSTECTOMY, LAPAROSCOPIC  2006  . ESOPHAGOGASTRODUODENOSCOPY (EGD) WITH PROPOFOL N/A 01/31/2018   Procedure: ESOPHAGOGASTRODUODENOSCOPY (EGD) WITH PROPOFOL;  Surgeon: Danie Binder, MD;  Location: [facility];  Service: Endoscopy;  Laterality: N/A;  7:30am    Obstetrical History: OB History    Gravida  2   Para  1   Term  1   Preterm      AB      Living  1     SAB      TAB      Ectopic      Multiple      Live Births  1           Social History: Social History   Socioeconomic History  . Marital status: Single    Spouse name: Not on file  . Number of  children: 1  . Years of education: Not on file  . Highest education level: High school graduate  Occupational History  . Not on file  Tobacco Use  . Smoking status: Current Every Day Smoker    Packs/day: 0.25    Types: Cigarettes  . Smokeless tobacco: Never Used  Vaping Use  . Vaping Use: Never used  Substance and Sexual Activity  . Alcohol use: Not Currently    Comment: occasionally  . Drug use: Not Currently  . Sexual activity: Yes    Birth control/protection: None  Other Topics Concern  . 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 ONE DAUGHTER, SINGLE. LIVES WITH GRANDFATHER(AGE 51)   Social Determinants of Health   Financial Resource Strain: Low Risk   . Difficulty of Paying Living Expenses: Not very hard  Food Insecurity: No Food Insecurity  . Worried About Charity fundraiser in the Last Year: Never true  . Ran Out of Food in the Last Year: Never true  Transportation Needs: No Transportation Needs  . Lack of Transportation (Medical): No  . Lack of Transportation (Non-Medical): No  Physical Activity: Insufficiently Active  .  Days of Exercise per Week: 1 day  . Minutes of Exercise per Session: 10 min  Stress: No Stress Concern Present  . Feeling of Stress : Not at all  Social Connections: Moderately Isolated  . Frequency of Communication with Friends and Family: Three times a week  . Frequency of Social Gatherings with Friends and Family: Never  . Attends Religious Services: 1 to 4 times per year  . Active Member of Clubs or Organizations: No  . Attends Banker Meetings: Never  . Marital Status: Never married    Family History: Family History  Problem Relation Age of Onset  . Diabetes Brother   . Diabetes Maternal Grandfather   . Colon cancer Neg Hx   . Colon polyps Neg Hx     Allergies: No Known Allergies  Medications Prior to Admission  Medication Sig Dispense Refill Last Dose  . aspirin EC 81 MG  tablet Take 2 tablets (162 mg total) by mouth daily. 60 tablet 6   . ferrous sulfate 325 (65 FE) MG tablet Take 325 mg by mouth daily with breakfast.     . labetalol (NORMODYNE) 200 MG tablet Take 2 tablets (400 mg total) by mouth 3 (three) times daily. 180 tablet 6   . omeprazole (PRILOSEC) 20 MG capsule Take 1 capsule (20 mg total) by mouth daily. 1 tablet a day (Patient taking differently: Take 20 mg by mouth. 1 tablet a day) 30 capsule 6   . Prenat-Fe Carbonyl-FA-Omega 3 (ONE-A-DAY WOMENS PRENATAL 1) 28-0.8-235 MG CAPS Take 1 tablet by mouth daily. 100 capsule 11      Review of Systems  All systems reviewed and negative except as stated in HPI  Physical Exam Blood pressure (!) 170/110, pulse 100, temperature 98.9 F (37.2 C), temperature source Oral, resp. rate 15, height 5\' 2"  (1.575 m), weight 108.5 kg, last menstrual period 12/12/2018, SpO2 99 %. General appearance: alert, oriented, NAD Lungs: normal respiratory effort Heart: regular rate Abdomen: soft, non-tender; gravid Extremities: No calf swelling or tenderness FHR: baseline 145, moderate variability, no accels, no decels, toco noisy  Prenatal labs: ABO, Rh: O/Positive/-- (01/05 1601) Antibody: Negative (04/13 0915) Rubella: 2.50 (01/05 1601) RPR: Non Reactive (04/13 0915)  HBsAg: Negative (01/05 1601)  HIV: Non Reactive (04/13 0915)  GC/Chlamydia: no third trimester on file, ordered  GBS:   unknown 2-hr GTT: normal 06/26/2019 Genetic screening:  Normal Materni21 Anatomy 06/28/2019: normal (isolated EICF)  Prenatal Transfer Tool  Maternal Diabetes: No Genetic Screening: Normal Maternal Ultrasounds/Referrals: Normal and Isolated EIF (echogenic intracardiac focus) Fetal Ultrasounds or other Referrals:  None Maternal Substance Abuse:  No Significant Maternal Medications:  Meds include: Other: Labetalol 400mg  TID Significant Maternal Lab Results: None  Results for orders placed or performed during the hospital encounter of  08/28/19 (from the past 24 hour(s))  CBC   Collection Time: 08/28/19  5:09 PM  Result Value Ref Range   WBC 10.9 (H) 4.0 - 10.5 K/uL   RBC 4.15 3.87 - 5.11 MIL/uL   Hemoglobin 13.6 12.0 - 15.0 g/dL   HCT 08/30/19 36 - 46 %   MCV 94.9 80.0 - 100.0 fL   MCH 32.8 26.0 - 34.0 pg   MCHC 34.5 30.0 - 36.0 g/dL   RDW 08/30/19 85.9 - 29.2 %   Platelets 277 150 - 400 K/uL   nRBC 0.0 0.0 - 0.2 %    Patient Active Problem List   Diagnosis Date Noted  . Chronic hypertension with superimposed pre-eclampsia 08/28/2019  .  Abnormal chromosomal and genetic finding on antenatal screening mother 05/21/2019  . Sickle cell trait (HCC) 03/26/2019  . Supervision of high risk pregnancy, antepartum 03/20/2019  . Smoker 03/20/2019  . History of cesarean delivery 03/20/2019  . Chronic hypertension affecting pregnancy   . Dyspepsia 01/26/2018  . Iron deficiency anemia due to chronic blood loss 01/26/2018  . Constipation 01/26/2018    Assessment: Japneet A Cherubin is a 39 y.o. G2P1001 at [redacted]w[redacted]d here for scheduled CS.  #RCS:  Patient here with severe range BP's c/w cHTN with superimposed PreE with severe features by BP. The risks of cesarean section were discussed with the patient including but were not limited to: bleeding which may require transfusion or reoperation; infection which may require antibiotics; injury to bowel, bladder, ureters or other surrounding organs; injury to the fetus; need for additional procedures including hysterectomy in the event of a life-threatening hemorrhage; placental abnormalities wth subsequent pregnancies, incisional problems, thromboembolic phenomenon and other postoperative/anesthesia complications.  Patient also desires permanent sterilization.  Other reversible forms of contraception were discussed with patient; she declines all other modalities. Risks of procedure discussed with patient including but not limited to: risk of regret, permanence of method, bleeding, infection, injury to  surrounding organs and need for additional procedures.  Failure risk of about 1% with increased risk of ectopic gestation if pregnancy occurs was also discussed with patient.  Also discussed possibility of post-tubal pain syndrome. The patient concurred with the proposed plan, giving informed written consent for the procedures.  Patient has been NPO since 1430 she will remain NPO for procedure. Anesthesia and OR aware.  Preoperative prophylactic antibiotics and SCDs ordered on call to the OR.  To OR when ready.  #Anesthesia: Spinal #FWB: FHR Cat I #GBS/ID: unknown #COVID: swab pending #MOF: Bottle #MOC: BTL, prefers [employer] after discussion of options #Circ: n/a  #cHTN w SI PreE w SF: cont to have severe range BP's in MAU, starting labetalol protocol. Admission CBC/CMP unremarkable though AST/ALT are borderline normal. Will cont labetalol 400mg  TID, start Mg gtt.   [facility] 08/28/2019, 5:48 PM

## 2019-08-28 NOTE — Discharge Summary (Signed)
Postpartum Discharge Summary  Date of Service updated 08/30/2019     Patient Name: Angel Moore DOB: [id number] MRN: [id number]  Date of admission: 08/28/2019 Delivery date:08/28/2019  Delivering provider: Truett Mainland  Date of discharge: 08/30/2019  Admitting diagnosis: Chronic hypertension with superimposed pre-eclampsia [O11.9] Intrauterine pregnancy: [redacted]w[redacted]d    Secondary diagnosis:  Active Problems:   Supervision of high risk pregnancy, antepartum   History of cesarean delivery   Chronic hypertension affecting pregnancy   [place]   Abnormal chromosomal and genetic finding on antenatal screening mother   Chronic hypertension with superimposed pre-eclampsia  Additional problems: none    Discharge diagnosis: Term Pregnancy Delivered and CHTN with superimposed preeclampsia                                              Post partum procedures:no Augmentation: N/A Complications: None  Hospital course: Sceduled C/S   39y.o. yo G2P2002 at [id number] admitted to the hospital 08/28/2019 for cesarean section with the following indication: chronic hypertension with superimposed severe preeclampsia.  Delivery details are as follows:  Membrane Rupture Time/Date: 8:18 PM ,08/28/2019   Delivery Method:C-Section, Low Transverse  Details of operation can be found in separate operative note.  Patient had an uncomplicated postpartum course.  She is ambulating, tolerating a regular diet, passing flatus, and urinating well. Patient is discharged home in stable condition on  08/30/19        Newborn Data: Birth date:08/28/2019  Birth time:8:19 PM  Gender:Female  Living status:Living  Apgars:8 ,9  Weight:2880 g     Magnesium Sulfate received: Yes: Seizure prophylaxis BMZ received: No Rhophylac:N/A MMR:N/A T-DaP:Given prenatally Flu: No Transfusion:No  Physical exam  Vitals:   08/29/19 2100 08/29/19 2325 08/30/19 0419 08/30/19 0852  BP:  114/62 131/82 (!) 150/95   Pulse:  82 83 83  Resp: '18 18 18 18  '$ Temp:  98.6 F (37 C) 98.4 F (36.9 C) 98.5 F (36.9 C)  TempSrc:  Oral Oral Oral  SpO2:  99% 99% 99%  Weight:      Height:       General: alert, cooperative and no distress Lochia: appropriate Uterine Fundus: firm Incision: Healing well with no significant drainage DVT Evaluation: No evidence of DVT seen on physical exam. Labs: Lab Results  Component Value Date   WBC 17.2 (H) 08/29/2019   HGB 10.7 (L) 08/29/2019   HCT 31.2 (L) 08/29/2019   MCV 95.1 08/29/2019   PLT 227 08/29/2019   CMP Latest Ref Rng & Units 08/28/2019  Glucose 70 - 99 mg/dL 79  BUN 6 - 20 mg/dL 7  Creatinine 0.44 - 1.00 mg/dL 0.55  Sodium 135 - 145 mmol/L 140  Potassium 3.5 - 5.1 mmol/L 3.9  Chloride 98 - 111 mmol/L 110  CO2 22 - 32 mmol/L 22  Calcium 8.9 - 10.3 mg/dL 8.6(L)  Total Protein 6.5 - 8.1 g/dL 6.0(L)  Total Bilirubin 0.3 - 1.2 mg/dL 1.0  Alkaline Phos 38 - 126 U/L 201(H)  AST 15 - 41 U/L 40  ALT 0 - 44 U/L 37   Edinburgh Score: Edinburgh Postnatal Depression Scale Screening Tool 08/29/2019  I have been able to laugh and see the funny side of things. (No Data)     After visit meds:  Allergies as of 08/30/2019   No Known  Allergies     Medication List    STOP taking these medications   aspirin EC 81 MG tablet   omeprazole 20 MG capsule Commonly known as: PRILOSEC     TAKE these medications   ferrous sulfate 325 (65 FE) MG tablet Take 325 mg by mouth daily with breakfast.   hydrochlorothiazide 25 MG tablet Commonly known as: HYDRODIURIL Take 1 tablet (25 mg total) by mouth daily.   ibuprofen 800 MG tablet Commonly known as: ADVIL Take 1 tablet (800 mg total) by mouth every 6 (six) hours.   labetalol 200 MG tablet Commonly known as: NORMODYNE Take 2 tablets (400 mg total) by mouth 3 (three) times daily.   One-A-Day Womens Prenatal 1 28-0.8-235 MG Caps Take 1 tablet by mouth daily.   oxyCODONE-acetaminophen 5-325 MG  tablet Commonly known as: PERCOCET/ROXICET Take 1-2 tablets by mouth every 4 (four) hours as needed for moderate pain.        Discharge home in stable condition Infant Feeding: Breast Infant Disposition:home with mother Discharge instruction: per After Visit Summary and Postpartum booklet. Activity: Advance as tolerated. Pelvic rest for 6 weeks.  Diet: routine diet Future Appointments: Future Appointments  Date Time Provider [place]  09/04/2019  1:30 PM Florian Buff, MD [facility]  09/10/2019  8:20 AM [facility] 1 [facility] None   Follow up Visit:  Follow-up Information    [facility] Follow up in 1 week(s).   Specialty: Obstetrics and Gynecology Why: BP and incision check Contact information: [address] [place] [phone]               Please schedule this patient for a In person postpartum visit in 4 weeks with the following provider: Any provider. Additional Postpartum F/U:Incision check 1 week and BP check 1 week  High risk pregnancy complicated by: HTN Delivery mode:  C-Section, Low Transverse  Anticipated Birth Control:  Unsure   08/30/2019 Emeterio Reeve, MD

## 2019-08-28 NOTE — Transfer of Care (Signed)
Immediate Anesthesia Transfer of Care Note  Patient: Angel Moore  Procedure(s) Performed: CESAREAN SECTION (N/A )  Patient Location: PACU  Anesthesia Type:Spinal  Level of Consciousness: awake, alert  and oriented  Airway & Oxygen Therapy: Patient Spontanous Breathing and Patient connected to nasal cannula oxygen  Post-op Assessment: Report given to RN and Post -op Vital signs reviewed and stable  Post vital signs: Reviewed and stable  Last Vitals:  Vitals Value Taken Time  BP    Temp    Pulse 86 08/28/19 2112  Resp 22 08/28/19 2112  SpO2 99 % 08/28/19 2112  Vitals shown include unvalidated device data.  Last Pain:  Vitals:   08/28/19 1805  TempSrc:   PainSc: 0-No pain         Complications: No complications documented.

## 2019-08-28 NOTE — Anesthesia Postprocedure Evaluation (Signed)
Anesthesia Post Note  Patient: Angel Moore  Procedure(s) Performed: CESAREAN SECTION (N/A )     Patient location during evaluation: PACU Anesthesia Type: Spinal Level of consciousness: oriented and awake and alert Pain management: pain level controlled Vital Signs Assessment: post-procedure vital signs reviewed and stable Respiratory status: spontaneous breathing, respiratory function stable and patient connected to nasal cannula oxygen Cardiovascular status: blood pressure returned to baseline and stable Postop Assessment: no headache, no backache and no apparent nausea or vomiting Anesthetic complications: no   No complications documented.  Last Vitals:  Vitals:   08/28/19 2215 08/28/19 2218  BP: (!) 137/93 (!) 137/93  Pulse: 86 86  Resp: 17 17  Temp:  36.7 C  SpO2: 99% 98%    Last Pain:  Vitals:   08/28/19 2218  TempSrc: Oral  PainSc: 0-No pain   Pain Goal:                   Shelton Silvas

## 2019-08-28 NOTE — MAU Provider Note (Signed)
Chief Complaint  Patient presents with   Hypertension     First Provider Initiated Contact with Patient 08/28/19 1701      S: Angel Moore  is a 39 y.o. y.o. year old G31P1001 female at [redacted]w[redacted]d weeks gestation who presents to MAU with elevated blood pressures. Hx of chronic hypertension. Current blood pressure medication: labetalol 400 mg TID.  Was in office today for ROB visit & has severe range BPs.   Associated symptoms: denies Headache, denies vision changes, denies epigastric pain Contractions: denies Vaginal bleeding: denies Fetal movement: good  O:  Patient Vitals for the past 24 hrs:  BP Temp Temp src Pulse Resp SpO2 Height Weight  08/28/19 1715 (!) 184/125 -- -- 93 -- 99 % -- --  08/28/19 1656 -- 98.9 F (37.2 C) Oral 97 15 100 % 5\' 2"  (1.575 m) 108.5 kg  08/28/19 1655 (!) 182/118 -- -- 96 -- -- -- --   General: NAD Heart: Regular rate Lungs: Normal rate and effort Abd: Soft, NT, Gravid, S=D Extremities: 2+ Pedal edema Neuro: 2+ deep tendon reflexes, No clonus  NST:  Baseline: 150 bpm, Variability: Fair (1-6 bpm), Accelerations: Reactive and Decelerations: Absent  No results found for this or any previous visit (from the past 24 hour(s)).  MDM Severe range BPs which is above patient's baseline. Asymptomatic. PEC labs pending.  Dr. 08/30/19 notified of patient's presentation & NPO status.  COVID swab collected  Dr. Adrian Blackwater at bedside to discuss plan of care with patient. Will be repeat c/section.   Adrian Blackwater, NP 08/28/2019 5:27 PM

## 2019-08-28 NOTE — Progress Notes (Signed)
   HIGH-RISK PREGNANCY VISIT Patient name: Angel Moore MRN [id number]  Date of birth: 10-23-1980 Chief Complaint:   High Risk Gestation ([place] today; pain in back of neck; + headache)  History of Present Illness:   Angel Moore is a 39 y.o. G53P1001 female at [redacted]w[redacted]d with an Estimated Date of Delivery: 09/18/19 being seen today for ongoing management of a high-risk pregnancy complicated by chronic hypertension currently on Labetalol 400mg  TID, took at 1030 this am.  Today she reports no complaints. Did have a headache, and crick in her neck yesterday. None today. Denies visual changes, ruq/epigastric pain, n/v.  Does have h/o pre-e. Last solid food 1130. Water 1445.  Depression screen [facility] 2/9 06/26/2019 03/20/2019  Decreased Interest 0 0  Down, Depressed, Hopeless 0 0  PHQ - 2 Score 0 0  Altered sleeping 0 0  Tired, decreased energy 0 1  Change in appetite 0 0  Feeling bad or failure about yourself  0 0  Trouble concentrating 0 0  Moving slowly or fidgety/restless 0 0  Suicidal thoughts 0 -  PHQ-9 Score 0 1    Contractions: Not present. Vag. Bleeding: None.  Movement: Present. denies leaking of fluid.  Review of Systems:   Pertinent items are noted in HPI Denies abnormal vaginal discharge w/ itching/odor/irritation, headaches, visual changes, shortness of breath, chest pain, abdominal pain, severe nausea/vomiting, or problems with urination or bowel movements unless otherwise stated above. Pertinent History Reviewed:  Reviewed past medical,surgical, social, obstetrical and family history.  Reviewed problem list, medications and allergies. Physical Assessment:   Vitals:   08/28/19 1420 08/28/19 1422  BP: (!) 167/112 (!) 160/114  Pulse: 91 80  Weight: 234 lb 8 oz (106.4 kg)   Body mass index is 41.54 kg/m.           Physical Examination:   General appearance: alert, well appearing, and in no distress  Mental status: alert, oriented to person, place, and time  Skin: warm & dry    Extremities: Edema: Trace    Cardiovascular: normal heart rate noted  Respiratory: normal respiratory effort, no distress  Abdomen: gravid, soft, non-tender  Pelvic: gbs, gc/ct        Fetal Status: Fetal Heart Rate (bpm): 148 u/s   Movement: Present Presentation: Vertex  Fetal Surveillance Testing today: 08/30/19 37 wks,cephalic,BPP 8/8,posterior placenta gr 3,AFI 24 cm,fhr 148 bpm,RI .73,.54,.55=71%,EFW 3158 g 63%  Chaperone: Amanda Rash    No results found for this or any previous visit (from the past 24 hour(s)).  Assessment & Plan:  1) High-risk pregnancy G2P1001 at [redacted]w[redacted]d with an Estimated Date of Delivery: 09/18/19   2) CHTN, unstable on Labetalol 400mg  TID, hasn't voided for 11/19/19, so unable to assess proteinuria. Currently asymptomatic, had headache yesterday. Now w/ severe range bp x 2 today, to MAU for further eval, will be RCS. Last solid food 1130, water 1445. Notified , NP  3) Prev c/s, for RCS w/ BTL (consent 06/18/19)  Meds: No orders of the defined types were placed in this encounter.  Labs/procedures today: u/s, tdap  Treatment Plan:  To MAU  Follow-up: No follow-ups on file.  Orders Placed This Encounter  Procedures  . Strep Gp B NAA  . Tdap vaccine greater than or equal to 7yo IM   Judeth Horn CNM, [facility] 08/28/2019 2:52 PM

## 2019-08-28 NOTE — Progress Notes (Signed)
Meridee Score, RN OBOR charge RN notified regarding pt Cesarean.

## 2019-08-28 NOTE — Anesthesia Procedure Notes (Signed)
Spinal  Start time: 08/28/2019 7:55 PM End time: 08/28/2019 7:58 PM Staffing Performed: anesthesiologist  Anesthesiologist: Shelton Silvas, MD Preanesthetic Checklist Completed: patient identified, IV checked, site marked, risks and benefits discussed, surgical consent, monitors and equipment checked, pre-op evaluation and timeout performed Spinal Block Patient position: sitting Prep: DuraPrep and site prepped and draped Location: L3-4 Injection technique: single-shot Needle Needle type: Pencan  Needle gauge: 24 G Needle length: 10 cm Needle insertion depth: 10 cm Additional Notes Patient tolerated well. No immediate complications.

## 2019-08-28 NOTE — MAU Note (Signed)
Report given to Dr. Arby Barrette. OB Anesthesia.

## 2019-08-28 NOTE — Anesthesia Preprocedure Evaluation (Addendum)
Anesthesia Evaluation  Patient identified by MRN, date of birth, ID band Patient awake    Reviewed: Allergy & Precautions, NPO status , Patient's Chart, lab work & pertinent test results  Airway Mallampati: III  TM Distance: >3 FB Neck ROM: Full    Dental  (+) Teeth Intact, Dental Advisory Given   Pulmonary Current Smoker,    breath sounds clear to auscultation       Cardiovascular hypertension, Pt. on home beta blockers  Rhythm:Regular Rate:Normal     Neuro/Psych negative neurological ROS  negative psych ROS   GI/Hepatic Neg liver ROS, GERD  Medicated,  Endo/Other  negative endocrine ROS  Renal/GU negative Renal ROS     Musculoskeletal   Abdominal (+) + obese,   Peds  Hematology  (+) anemia ,   Anesthesia Other Findings   Reproductive/Obstetrics (+) Pregnancy                            Anesthesia Physical Anesthesia Plan  ASA: III  Anesthesia Plan: Spinal   Post-op Pain Management:    Induction: Intravenous  PONV Risk Score and Plan: 1 and Ondansetron and Treatment may vary due to age or medical condition  Airway Management Planned: Natural Airway and Simple Face Mask  Additional Equipment: None  Intra-op Plan:   Post-operative Plan:   Informed Consent: I have reviewed the patients History and Physical, chart, labs and discussed the procedure including the risks, benefits and alternatives for the proposed anesthesia with the patient or authorized representative who has indicated his/her understanding and acceptance.       Plan Discussed with: CRNA  Anesthesia Plan Comments: (Lab Results      Component                Value               Date                      WBC                      10.9 (H)            08/28/2019                HGB                      13.6                08/28/2019                HCT                      39.4                08/28/2019                 MCV                      94.9                08/28/2019                PLT                      277  08/28/2019           )       Anesthesia Quick Evaluation

## 2019-08-28 NOTE — Op Note (Signed)
Angel Moore PROCEDURE DATE: 08/28/2019  PREOPERATIVE DIAGNOSES: Intrauterine pregnancy at [redacted]w[redacted]d weeks gestation; severe preeclampsia  POSTOPERATIVE DIAGNOSES: The same  PROCEDURE: Repeat Low Transverse Cesarean Section  SURGEON:  Dr. Mariel Aloe  ASSISTANT:  Dr. Jerilynn Birkenhead, Candelaria Celeste, MD  An experienced assistant was required given the standard of surgical care given the complexity of the case.  This assistant was needed for exposure, dissection, suctioning, retraction, instrument exchange, assisting with delivery with administration of fundal pressure, and for overall help during the procedure.  ANESTHESIOLOGY TEAM: Anesthesiologist: Shelton Silvas, MD CRNA: Junious Silk, CRNA  INDICATIONS: Angel Moore is a 39 y.o. [phone] at [redacted]w[redacted]d here for cesarean section secondary to the indications listed under preoperative diagnoses; please see preoperative note for further details.  The risks of surgery were discussed with the patient including but were not limited to: bleeding which may require transfusion or reoperation; infection which may require antibiotics; injury to bowel, bladder, ureters or other surrounding organs; injury to the fetus; need for additional procedures including hysterectomy in the event of a life-threatening hemorrhage; formation of adhesions; placental abnormalities wth subsequent pregnancies; incisional problems; thromboembolic phenomenon and other postoperative/anesthesia complications.  The patient concurred with the proposed plan, giving informed written consent for the procedure.    FINDINGS:  Viable female infant in cephalic presentation.  APGAR (1 MIN): 8   APGAR (5 MINS): 9   APGAR (10 MINS):    Clear amniotic fluid.  Intact placenta, three vessel cord.  Normal uterus, fallopian tubes and ovaries bilaterally.  ANESTHESIA: Spinal INTRAVENOUS FLUIDS: 1000 ml   ESTIMATED BLOOD LOSS: 450 ml URINE OUTPUT:  450 ml SPECIMENS: Placenta sent to  pathology COMPLICATIONS: None immediate  PROCEDURE IN DETAIL:  The patient preoperatively received intravenous antibiotics and had sequential compression devices applied to her lower extremities.  She was then taken to the operating room where spinal anesthesia was administered in sterile fashion and was found to be adequate. She was then placed in a dorsal supine position with a leftward tilt, and prepped and draped in a sterile manner.  A foley catheter was placed into her bladder and attached to constant gravity.  After an adequate timeout was performed, a Pfannenstiel skin incision was made with scalpel two fingerbreaths above the pubic symphysis on her preexisting scar and carried through to the underlying layer of fascia. The fascia was incised in the midline, and this incision was extended bilaterally using the Mayo scissors.  Kocher clamps x 2 were applied to the superior aspect of the fascial incision and the underlying rectus muscles were dissected off bluntly and sharply.  A similar process was carried out on the inferior aspect of the fascial incision. The rectus muscles were separated in the midline and the peritoneum was entered bluntly. The Alexis self-retaining retractor was introduced into the abdominal cavity.  The vesicouterine peritoneum was identified and grasped using smooth pickups.  It was incised and extended laterally using the Metzenbaum scissors.  A bladder flap was then digitally created.  Attention was turned to the lower uterine segment where a low transverse hysterotomy was made with a scalpel and extended bilaterally bluntly.  The infant was successfully delivered, the cord was clamped and cut after one minute, and the infant was handed over to the awaiting neonatology team. Uterine massage was then administered, and the placenta delivered intact with a three-vessel cord. The uterus was then cleared of clots and debris using manual curettage.  The uterine incision was closed with  1-0 chromic in a running locked fashion, and an imbricating layer was also placed with 1-0 chromic.  Figure-of-eight 1-0 chromic serosal stitches were placed to help with hemostasis.  The pelvis was cleared of all clot and debris with irrigation and suction. Hemostasis was confirmed on all surfaces.  The retractor was removed.  The peritoneum was closed with a 2-0 Vicryl running stitch and the rectus muscles were reapproximated using 0 Vicryl interrupted stitches. The fascia was then closed using 0 Vicryl in a running fashion.  The subcutaneous layer was irrigated, reapproximated with 2-0 vicryl running stitches, and the skin was closed with a 4-0 Vicryl subcuticular stitch. The patient tolerated the procedure well. Sponge, instrument and needle counts were correct x 3.  She was taken to the recovery room in stable condition.    Lynnda Shields, MD, [place] [facility], [place]

## 2019-08-28 NOTE — MAU Note (Signed)
.   Angel Moore is a 39 y.o. at [redacted]w[redacted]d here in MAU reporting: [facility] for Pre E workup. States she has had a headache and floaters. No epigastric pain. BLE are edematous. Endorses good fetal movement. No VB or LOF.  Pain score: 0 Vitals:   08/28/19 1655 08/28/19 1656  BP: (!) 182/118   Pulse: 96 97  Resp:  15  Temp:  98.9 F (37.2 C)  SpO2:  100%     FHT:170

## 2019-08-28 NOTE — Progress Notes (Signed)
[place] 37 wks,cephalic,BPP 8/8,posterior placenta gr 3,AFI 24 cm,fhr 148 bpm,RI .73,.54,.55=71%,EFW 3158 g 63%

## 2019-08-29 ENCOUNTER — Encounter (HOSPITAL_COMMUNITY): Payer: Self-pay | Admitting: Family Medicine

## 2019-08-29 LAB — CBC
HCT: 31.2 % — ABNORMAL LOW (ref 36.0–46.0)
Hemoglobin: 10.7 g/dL — ABNORMAL LOW (ref 12.0–15.0)
MCH: 32.6 pg (ref 26.0–34.0)
MCHC: 34.3 g/dL (ref 30.0–36.0)
MCV: 95.1 fL (ref 80.0–100.0)
Platelets: 227 10*3/uL (ref 150–400)
RBC: 3.28 MIL/uL — ABNORMAL LOW (ref 3.87–5.11)
RDW: 13.3 % (ref 11.5–15.5)
WBC: 17.2 10*3/uL — ABNORMAL HIGH (ref 4.0–10.5)
nRBC: 0 % (ref 0.0–0.2)

## 2019-08-29 LAB — GC/CHLAMYDIA PROBE AMP (~~LOC~~) NOT AT ARMC
Chlamydia: NEGATIVE
Comment: NEGATIVE
Comment: NORMAL
Neisseria Gonorrhea: NEGATIVE

## 2019-08-29 LAB — RPR: RPR Ser Ql: NONREACTIVE

## 2019-08-29 MED ORDER — PNEUMOCOCCAL VAC POLYVALENT 25 MCG/0.5ML IJ INJ
0.5000 mL | INJECTION | INTRAMUSCULAR | Status: DC
  Filled 2019-08-29: qty 0.5

## 2019-08-29 NOTE — Progress Notes (Signed)
MD made aware of pt BP of 103/72. RN instructed to hold ordered dose of Labetalol.

## 2019-08-29 NOTE — Progress Notes (Signed)
Subjective: Postpartum Day 1: Cesarean Delivery Patient reports incisional pain, tolerating PO, + flatus, + BM and no problems voiding.  Denies HA, RUQ pain, vision changes, Currently on Mag for seizure PPX  Objective: Vital signs in last 24 hours: Temp:  [96.9 F (36.1 C)-98.9 F (37.2 C)] 98.2 F (36.8 C) (06/16 0730) Pulse Rate:  [76-143] 76 (06/16 0730) Resp:  [14-24] 18 (06/16 0730) BP: (101-185)/(62-128) 110/65 (06/16 0730) SpO2:  [97 %-100 %] 97 % (06/16 0730) Weight:  [106.4 kg-108.5 kg] 108.5 kg (06/15 1656)  Physical Exam:  General: alert and cooperative Lochia: appropriate Uterine Fundus: firm Incision: healing well, no significant drainage, no dehiscence DVT Evaluation: No evidence of DVT seen on physical exam.  Recent Labs    08/28/19 1709 08/29/19 0444  HGB 13.6 10.7*  HCT 39.4 31.2*    Assessment/Plan: Status post Cesarean section. Doing well postoperatively.  Continue current care. 24 hours of Mag discontinued at 2100. Restart labetalol 400mg , if BP consistently > 150 systolic, 100 > diastolic.   08/29/2019, 10:59 AM

## 2019-08-29 NOTE — Progress Notes (Signed)
When RN entered room, baby was sleeping on back in mothers bed, mother was sleeping in bed, father was sleeping on couch. RN woke up mother and father,  Mother and father were educated on safe sleep and the importance of back to sleep and use of the crib.

## 2019-08-29 NOTE — Progress Notes (Signed)
Mother and father of baby were re-educated on importance of safe sleep and that the baby needs to sleep on her back in the crib and that is not safe to co-sleep

## 2019-08-29 NOTE — Progress Notes (Signed)
MD made aware of pt BP of 114/73. RN instructed to hold dose of Labetalol.

## 2019-08-30 ENCOUNTER — Encounter (HOSPITAL_COMMUNITY): Payer: Self-pay | Admitting: Family Medicine

## 2019-08-30 LAB — STREP GP B NAA: Strep Gp B NAA: POSITIVE — AB

## 2019-08-30 LAB — CERVICOVAGINAL ANCILLARY ONLY
Chlamydia: NEGATIVE
Comment: NEGATIVE
Comment: NORMAL
Neisseria Gonorrhea: NEGATIVE

## 2019-08-30 LAB — SURGICAL PATHOLOGY

## 2019-08-30 MED ORDER — HYDROCHLOROTHIAZIDE 25 MG PO TABS: 25.0000 mg | ORAL_TABLET | Freq: Every day | ORAL | 0 refills | Status: DC

## 2019-08-30 MED ORDER — HYDROCHLOROTHIAZIDE 25 MG PO TABS
25.0000 mg | ORAL_TABLET | Freq: Every day | ORAL | Status: DC
  Administered 2019-08-30: 25 mg via ORAL
  Filled 2019-08-30: qty 1

## 2019-08-30 MED ORDER — IBUPROFEN 800 MG PO TABS: 800.0000 mg | ORAL_TABLET | Freq: Four times a day (QID) | ORAL | 0 refills | Status: DC

## 2019-08-30 MED ORDER — OXYCODONE-ACETAMINOPHEN 5-325 MG PO TABS: 1.0000 | ORAL_TABLET | ORAL | 0 refills | Status: DC | PRN

## 2019-08-30 MED FILL — HYDROCHLOROTHIAZIDE 25 MG T: 25 | 20 days supply | Qty: 20 | Fill #0

## 2019-08-30 MED FILL — OXYCODONE-APAP 5-325MG: 5-325 | 7 days supply | Qty: 30 | Fill #0

## 2019-08-30 MED FILL — IBUPROFEN 800 MG TAB: 800 | 7 days supply | Qty: 30 | Fill #0

## 2019-08-30 NOTE — Progress Notes (Signed)
Discharge to home  Teaching complete

## 2019-08-30 NOTE — Progress Notes (Signed)
CSW acknowledged consult for co sleeping and completed chart review.  CSW met with MOB in room 102 to address concerns related to co sleeping. When CSW arrived, MOB was resting in bed, infant was asleep in bassinet, and FOB was standing over infant's bassinet observing infant. CSW explained CSW's role and MOB gave CSW permission to complete the assessment while FOB was present. CSW provided information/education regarding safe sleep and SIDS.  MOB and FOB appeared interested; they asked appropriate questions and responded appropriately to CSW's questions. The parent's communicated having a safe bed for infant and all other essential items. CSW thanked the parent's for allowing CSW to meet with them.  There are no barriers to discharge.   Korrie Hofbauer Boyd-Gilyard, MSW, LCSW Clinical Social Work [phone]  

## 2019-08-30 NOTE — Discharge Instructions (Addendum)
Postpartum Hypertension Postpartum hypertension is high blood pressure that remains higher than normal after childbirth. You may not realize that you have postpartum hypertension if your blood pressure is not being checked regularly. In most cases, postpartum hypertension will go away on its own, usually within a week of delivery. However, for some women, medical treatment is required to prevent serious complications, such as seizures or stroke. What are the causes? This condition may be caused by one or more of the following:  Hypertension that existed before pregnancy (chronic hypertension).  Hypertension that comes on as a result of pregnancy (gestational hypertension).  Hypertensive disorders during pregnancy (preeclampsia) or seizures in women who have high blood pressure during pregnancy (eclampsia).  A condition in which the liver, platelets, and red blood cells are damaged during pregnancy (HELLP syndrome).  A condition in which the thyroid produces too much hormones (hyperthyroidism).  Other rare problems of the nerves (neurological disorders) or blood disorders. In some cases, the cause may not be known. What increases the risk? The following factors may make you more likely to develop this condition:  Chronic hypertension. In some cases, this may not have been diagnosed before pregnancy.  Obesity.  Type 2 diabetes.  Kidney disease.  History of preeclampsia or eclampsia.  Other medical conditions that change the level of hormones in the body (hormonal imbalance). What are the signs or symptoms? As with all types of hypertension, postpartum hypertension may not have any symptoms. Depending on how high your blood pressure is, you may experience:  Headaches. These may be mild, moderate, or severe. They may also be steady, constant, or sudden in onset (thunderclap headache).  Changes in your ability to see (visual changes).  Dizziness.  Shortness of breath.  Swelling  of your hands, feet, lower legs, or face. In some cases, you may have swelling in more than one of these locations.  Heart palpitations or a racing heartbeat.  Difficulty breathing while lying down.  Decrease in the amount of urine that you pass. Other rare signs and symptoms may include:  Sweating more than usual. This lasts longer than a few days after delivery.  Chest pain.  Sudden dizziness when you get up from sitting or lying down.  Seizures.  Nausea or vomiting.  Abdominal pain. How is this diagnosed? This condition may be diagnosed based on the results of a physical exam, blood pressure measurements, and blood and urine tests. You may also have other tests, such as a CT scan or an MRI, to check for other problems of postpartum hypertension. How is this treated? If blood pressure is high enough to require treatment, your options may include:  Medicines to reduce blood pressure (antihypertensives). Tell your health care provider if you are breastfeeding or if you plan to breastfeed. There are many antihypertensive medicines that are safe to take while breastfeeding.  Stopping medicines that may be causing hypertension.  Treating medical conditions that are causing hypertension.  Treating the complications of hypertension, such as seizures, stroke, or kidney problems. Your health care provider will also continue to monitor your blood pressure closely until it is within a safe range for you. Follow these instructions at home:  Take over-the-counter and prescription medicines only as told by your health care provider.  Return to your normal activities as told by your health care provider. Ask your health care provider what activities are safe for you.  Do not use any products that contain nicotine or tobacco, such as cigarettes and e-cigarettes. If   you need help quitting, ask your health care provider.  Keep all follow-up visits as told by your health care provider. This  is important. Contact a health care provider if:  Your symptoms get worse.  You have new symptoms, such as: ? A headache that does not get better. ? Dizziness. ? Visual changes. Get help right away if:  You suddenly develop swelling in your hands, ankles, or face.  You have sudden, rapid weight gain.  You develop difficulty breathing, chest pain, racing heartbeat, or heart palpitations.  You develop severe pain in your abdomen.  You have any symptoms of a stroke. "BE FAST" is an easy way to remember the main warning signs of a stroke: ? B - Balance. Signs are dizziness, sudden trouble walking, or loss of balance. ? E - Eyes. Signs are trouble seeing or a sudden change in vision. ? F - Face. Signs are sudden weakness or numbness of the face, or the face or eyelid drooping on one side. ? A - Arms. Signs are weakness or numbness in an arm. This happens suddenly and usually on one side of the body. ? S - Speech. Signs are sudden trouble speaking, slurred speech, or trouble understanding what people say. ? T - Time. Time to call emergency services. Write down what time symptoms started.  You have other signs of a stroke, such as: ? A sudden, severe headache with no known cause. ? Nausea or vomiting. ? Seizure. These symptoms may represent a serious problem that is an emergency. Do not wait to see if the symptoms will go away. Get medical help right away. Call your local emergency services (911 in the [place] Do not drive yourself to the hospital. Summary  Postpartum hypertension is high blood pressure that remains higher than normal after childbirth.  In most cases, postpartum hypertension will go away on its own, usually within a week of delivery.  For some women, medical treatment is required to prevent serious complications, such as seizures or stroke. This information is not intended to replace advice given to you by your health care provider. Make sure you discuss any questions  you have with your health care provider. Document Revised: 04/07/2018 Document Reviewed: 12/20/2016 Elsevier Patient Education  2020 [employer] Cesarean Delivery, Care After This sheet gives you information about how to care for yourself after your procedure. Your health care provider may also give you more specific instructions. If you have problems or questions, contact your health care provider. What can I expect after the procedure? After the procedure, it is common to have:  A small amount of blood or clear fluid coming from the incision.  Some redness, swelling, and pain in your incision area.  Some abdominal pain and soreness.  Vaginal bleeding (lochia). Even though you did not have a vaginal delivery, you will still have vaginal bleeding and discharge.  Pelvic cramps.  Fatigue. You may have pain, swelling, and discomfort in the tissue between your vagina and your anus (perineum) if:  Your C-section was unplanned, and you were allowed to labor and push.  An incision was made in the area (episiotomy) or the tissue tore during attempted vaginal delivery. Follow these instructions at home: Incision care   Follow instructions from your health care provider about how to take care of your incision. Make sure you: ? Wash your hands with soap and water before you change your bandage (dressing). If soap and water are not available, use hand sanitizer. ? If you  have a dressing, change it or remove it as told by your health care provider. ? Leave stitches (sutures), skin staples, skin glue, or adhesive strips in place. These skin closures may need to stay in place for 2 weeks or longer. If adhesive strip edges start to loosen and curl up, you may trim the loose edges. Do not remove adhesive strips completely unless your health care provider tells you to do that.  Check your incision area every day for signs of infection. Check for: ? More redness, swelling, or pain. ? More fluid or  blood. ? Warmth. ? Pus or a bad smell.  Do not take baths, swim, or use a hot tub until your health care provider says it's okay. Ask your health care provider if you can take showers.  When you cough or sneeze, hug a pillow. This helps with pain and decreases the chance of your incision opening up (dehiscing). Do this until your incision heals. Medicines  Take over-the-counter and prescription medicines only as told by your health care provider.  If you were prescribed an antibiotic medicine, take it as told by your health care provider. Do not stop taking the antibiotic even if you start to feel better.  Do not drive or use heavy machinery while taking prescription pain medicine. Lifestyle  Do not drink alcohol. This is especially important if you are breastfeeding or taking pain medicine.  Do not use any products that contain nicotine or tobacco, such as cigarettes, e-cigarettes, and chewing tobacco. If you need help quitting, ask your health care provider. Eating and drinking  Drink at least 8 eight-ounce glasses of water every day unless told not to by your health care provider. If you breastfeed, you may need to drink even more water.  Eat high-fiber foods every day. These foods may help prevent or relieve constipation. High-fiber foods include: ? Whole grain cereals and breads. ? Brown rice. ? Beans. ? Fresh fruits and vegetables. Activity   If possible, have someone help you care for your baby and help with household activities for at least a few days after you leave the hospital.  Return to your normal activities as told by your health care provider. Ask your health care provider what activities are safe for you.  Rest as much as possible. Try to rest or take a nap while your baby is sleeping.  Do not lift anything that is heavier than 10 lbs (4.5 kg), or the limit that you were told, until your health care provider says that it is safe.  Talk with your health care  provider about when you can engage in sexual activity. This may depend on your: ? Risk of infection. ? How fast you heal. ? Comfort and desire to engage in sexual activity. General instructions  Do not use tampons or douches until your health care provider approves.  Wear loose, comfortable clothing and a supportive and well-fitting bra.  Keep your perineum clean and dry. Wipe from front to back when you use the toilet.  If you pass a blood clot, save it and call your health care provider to discuss. Do not flush blood clots down the toilet before you get instructions from your health care provider.  Keep all follow-up visits for you and your baby as told by your health care provider. This is important. Contact a health care provider if:  You have: ? A fever. ? Bad-smelling vaginal discharge. ? Pus or a bad smell coming from your incision. ?   Difficulty or pain when urinating. ? A sudden increase or decrease in the frequency of your bowel movements. ? More redness, swelling, or pain around your incision. ? More fluid or blood coming from your incision. ? A rash. ? Nausea. ? Little or no interest in activities you used to enjoy. ? Questions about caring for yourself or your baby.  Your incision feels warm to the touch.  Your breasts turn red or become painful or hard.  You feel unusually sad or worried.  You vomit.  You pass a blood clot from your vagina.  You urinate more than usual.  You are dizzy or light-headed. Get help right away if:  You have: ? Pain that does not go away or get better with medicine. ? Chest pain. ? Difficulty breathing. ? Blurred vision or spots in your vision. ? Thoughts about hurting yourself or your baby. ? New pain in your abdomen or in one of your legs. ? A severe headache.  You faint.  You bleed from your vagina so much that you fill more than one sanitary pad in one hour. Bleeding should not be heavier than your heaviest  period. Summary  After the procedure, it is common to have pain at your incision site, abdominal cramping, and slight bleeding from your vagina.  Check your incision area every day for signs of infection.  Tell your health care provider about any unusual symptoms.  Keep all follow-up visits for you and your baby as told by your health care provider. This information is not intended to replace advice given to you by your health care provider. Make sure you discuss any questions you have with your health care provider. Document Revised: 09/07/2017 Document Reviewed: 09/07/2017 Elsevier Patient Education  [place].

## 2019-08-30 NOTE — Progress Notes (Signed)
MD made aware of pt BP of 131/82, RN instructed to hold dose of Labetalol.

## 2019-08-31 ENCOUNTER — Other Ambulatory Visit: Payer: Medicare Other

## 2019-09-04 ENCOUNTER — Other Ambulatory Visit: Payer: Medicare Other

## 2019-09-04 ENCOUNTER — Encounter: Payer: Medicare Other | Admitting: Obstetrics & Gynecology

## 2019-09-07 ENCOUNTER — Other Ambulatory Visit: Payer: Medicare Other

## 2019-09-07 ENCOUNTER — Encounter: Payer: Medicare Other | Admitting: Obstetrics & Gynecology

## 2019-09-10 ENCOUNTER — Other Ambulatory Visit (HOSPITAL_COMMUNITY): Admission: RE | Admit: 2019-09-10 | Payer: Medicare Other | Source: Ambulatory Visit

## 2019-09-11 ENCOUNTER — Other Ambulatory Visit: Payer: Medicare Other

## 2019-09-11 ENCOUNTER — Encounter: Payer: Medicare Other | Admitting: Obstetrics and Gynecology

## 2019-09-12 ENCOUNTER — Inpatient Hospital Stay (HOSPITAL_COMMUNITY): Admit: 2019-09-12 | Payer: Medicare Other | Admitting: Obstetrics & Gynecology

## 2019-09-12 ENCOUNTER — Other Ambulatory Visit: Payer: Self-pay

## 2019-09-12 ENCOUNTER — Emergency Department (HOSPITAL_COMMUNITY)
Admission: EM | Admit: 2019-09-12 | Discharge: 2019-09-12 | Disposition: A | Discharge status: Home / Self Care | Payer: Medicare Other | Attending: Emergency Medicine | Admitting: Emergency Medicine

## 2019-09-12 ENCOUNTER — Encounter (HOSPITAL_COMMUNITY): Payer: Self-pay | Admitting: *Deleted

## 2019-09-12 DIAGNOSIS — Z5321 Procedure and treatment not carried out due to patient leaving prior to being seen by health care provider: Secondary | ICD-10-CM | POA: Insufficient documentation

## 2019-09-12 DIAGNOSIS — T889XXA Complication of surgical and medical care, unspecified, initial encounter: Secondary | ICD-10-CM | POA: Diagnosis not present

## 2019-09-12 DIAGNOSIS — Y763 Surgical instruments, materials and obstetric and gynecological devices (including sutures) associated with adverse incidents: Secondary | ICD-10-CM | POA: Insufficient documentation

## 2019-09-12 NOTE — ED Triage Notes (Addendum)
Pt reports she had a c-section 2 weeks ago and noticed last night that the middle part of her incision was open. Pt reports burning type pain to the opened area. Pt has approximately 1 inch opening of the skin to middle of c-section incision. Nothing protruding from the incision. No drainage.   Pt's BP in triage is 183/122. Pt reports due to transportation issues she hasn't taken her BP meds for the last few days because she hasn't been able to get to the pharmacy to get a refill. Denies headache and visual disturbances.

## 2019-09-13 ENCOUNTER — Telehealth: Payer: Self-pay | Admitting: *Deleted

## 2019-09-13 NOTE — Telephone Encounter (Signed)
Health Department called about patient. Patient's blood pressure is 186/102 right arm and 182/124 left arm pulse 90. No visual changes, no headaches, and no chest pain. Patient is not taking blood pressure medication. Also incision has small area 0.4 that is opened and yellow. One steri strip still in place. Scored 10 out of 30 on depression screen. Advised would call patient. Telephoned patient at home number and patient will come in today for incision check and blood pressure with provider.

## 2019-09-13 NOTE — Telephone Encounter (Signed)
Spoke to patient who states her mom is coming to pick her up now to take her to get her medication.  I informed her that we can see her to check her blood pressure but she really needed to get her meds.  She also stated that the nurse states the incision is not red or warm to touch and the area is very small.  I discussed all of this with Dr Emelda Fear who states she can just apply neosporin and a band-aid to the area as 2 weeks after a c/s, the area can open up slightly where it was "tied-off".  Advised after taking medication today, if her BP's are still >160 tomorrow after taking morning dosage, she needed to go to [facility] or if she developed headaches, vision changes, chest pain. Pt verbalized understanding.

## 2019-09-18 ENCOUNTER — Encounter: Payer: Medicare Other | Admitting: Obstetrics & Gynecology

## 2019-09-18 ENCOUNTER — Other Ambulatory Visit: Payer: Medicare Other

## 2019-09-19 ENCOUNTER — Emergency Department (HOSPITAL_COMMUNITY): Payer: Medicare Other

## 2019-09-19 ENCOUNTER — Encounter (HOSPITAL_COMMUNITY): Payer: Self-pay | Admitting: Emergency Medicine

## 2019-09-19 ENCOUNTER — Other Ambulatory Visit: Payer: Self-pay

## 2019-09-19 ENCOUNTER — Emergency Department (HOSPITAL_COMMUNITY)
Admission: EM | Admit: 2019-09-19 | Discharge: 2019-09-19 | Disposition: A | Discharge status: Home / Self Care | Payer: Medicare Other | Attending: Emergency Medicine | Admitting: Emergency Medicine

## 2019-09-19 DIAGNOSIS — S63259A Unspecified dislocation of unspecified finger, initial encounter: Secondary | ICD-10-CM | POA: Insufficient documentation

## 2019-09-19 DIAGNOSIS — S60947A Unspecified superficial injury of left little finger, initial encounter: Secondary | ICD-10-CM | POA: Diagnosis present

## 2019-09-19 DIAGNOSIS — I10 Essential (primary) hypertension: Secondary | ICD-10-CM | POA: Diagnosis not present

## 2019-09-19 DIAGNOSIS — Y998 Other external cause status: Secondary | ICD-10-CM | POA: Diagnosis not present

## 2019-09-19 DIAGNOSIS — W010XXA Fall on same level from slipping, tripping and stumbling without subsequent striking against object, initial encounter: Secondary | ICD-10-CM | POA: Diagnosis not present

## 2019-09-19 DIAGNOSIS — Y9289 Other specified places as the place of occurrence of the external cause: Secondary | ICD-10-CM | POA: Diagnosis not present

## 2019-09-19 DIAGNOSIS — F17210 Nicotine dependence, cigarettes, uncomplicated: Secondary | ICD-10-CM | POA: Insufficient documentation

## 2019-09-19 DIAGNOSIS — Y9389 Activity, other specified: Secondary | ICD-10-CM | POA: Diagnosis not present

## 2019-09-19 MED ORDER — HYDROMORPHONE HCL 1 MG/ML IJ SOLN
1.0000 mg | Freq: Once | INTRAMUSCULAR | Status: AC
  Administered 2019-09-19: 1 mg via INTRAMUSCULAR
  Filled 2019-09-19: qty 1

## 2019-09-19 NOTE — Discharge Instructions (Addendum)
You were evaluated in the Emergency Department and after careful evaluation, we did not find any emergent condition requiring admission or further testing in the hospital.  Your exam/testing today was overall reassuring.  We reduced your finger dislocation here in the emergency department.  As discussed, it is important that you follow-up with the hand specialist for further management.  Keep the fingers secured together until you see the specialist.  Use Tylenol or Motrin at home for discomfort.  Please return to the Emergency Department if you experience any worsening of your condition.  We encourage you to follow up with a primary care provider.  Thank you for allowing [place] to be a part of your care.

## 2019-09-19 NOTE — ED Provider Notes (Signed)
[facility] Emergency Department Provider Note MRN:  [id number]  Arrival date & time: 09/19/19     Chief Complaint   Finger Injury (left pinky finger)   History of Present Illness   Angel Moore is a 39 y.o. year-old female with a history of HTN presenting to the ED with chief complaint of finger injury.  Tripped and fell onto outstretched hand with sudden pain and bending of her left pinky finger.  Denies head trauma, no loss of consciousness, no other injuries or complaints.  Pain in the left pinky finger is moderate to severe, constant, worse with motion or palpation.  Review of Systems  A complete 10 system review of systems was obtained and all systems are negative except as noted in the HPI and PMH.   Patient's Health History    Past Medical History:  Diagnosis Date  . Anemia   . Heart murmur   . Hypertension   . Stress headaches   . Tobacco abuse    1 PK/WEEK    Past Surgical History:  Procedure Laterality Date  . BIOPSY  01/31/2018   Procedure: BIOPSY;  Surgeon: West Bali, MD;  Location: [facility] ENDO SUITE;  Service: Endoscopy;;  Gastric  . CESAREAN SECTION  08/2007  . CESAREAN SECTION N/A 08/28/2019   Procedure: CESAREAN SECTION;  Surgeon: Levie Heritage, DO;  Location: [facility] LD ORS;  Service: Obstetrics;  Laterality: N/A;  . CHOLECYSTECTOMY, LAPAROSCOPIC  2006  . ESOPHAGOGASTRODUODENOSCOPY (EGD) WITH PROPOFOL N/A 01/31/2018   Procedure: ESOPHAGOGASTRODUODENOSCOPY (EGD) WITH PROPOFOL;  Surgeon: West Bali, MD;  Location: [facility] ENDO [facility];  Service: Endoscopy;  Laterality: N/A;  7:30am    Family History  Problem Relation Age of Onset  . Diabetes Brother   . Diabetes Maternal Grandfather   . Colon cancer Neg Hx   . Colon polyps Neg Hx     Social History   Socioeconomic History  . Marital status: Single    Spouse name: Not on file  . Number of children: 1  . Years of education: Not on file  . Highest education level: High school  graduate  Occupational History  . Not on file  Tobacco Use  . Smoking status: Current Every Day Smoker    Packs/day: 0.25    Types: Cigarettes  . Smokeless tobacco: Never Used  Vaping Use  . Vaping Use: Never used  Substance and Sexual Activity  . Alcohol use: Not Currently    Comment: occasionally  . Drug use: Not Currently  . Sexual activity: Yes    Birth control/protection: None  Other Topics Concern  . 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 ONE DAUGHTER, SINGLE. LIVES WITH GRANDFATHER(AGE 42)   Social Determinants of Health   Financial Resource Strain: Low Risk   . Difficulty of Paying Living Expenses: Not very hard  Food Insecurity: No Food Insecurity  . Worried About Programme researcher, broadcasting/film/video in the Last Year: Never true  . Ran Out of Food in the Last Year: Never true  Transportation Needs: No Transportation Needs  . Lack of Transportation (Medical): No  . Lack of Transportation (Non-Medical): No  Physical Activity: Insufficiently Active  . Days of Exercise per Week: 1 day  . Minutes of Exercise per Session: 10 min  Stress: No Stress Concern Present  . Feeling of Stress : Not at all  Social Connections: Moderately Isolated  . Frequency of Communication with Friends and Family:  Three times a week  . Frequency of Social Gatherings with Friends and Family: Never  . Attends Religious Services: 1 to 4 times per year  . Active Member of Clubs or Organizations: No  . Attends Banker Meetings: Never  . Marital Status: Never married  Intimate Partner Violence: Not At Risk  . Fear of Current or Ex-Partner: No  . Emotionally Abused: No  . Physically Abused: No  . Sexually Abused: No     Physical Exam   Vitals:   09/19/19 0130  BP: (!) 165/117  Pulse: 88  Resp: 14  Temp: 100 F (37.8 C)  SpO2: 99%    CONSTITUTIONAL: Well-appearing, NAD NEURO:  Alert and oriented x 3, no focal deficits EYES:  eyes equal and  reactive ENT/NECK:  no LAD, no JVD CARDIO: Regular rate, well-perfused, normal S1 and S2 PULM:  CTAB no wheezing or rhonchi GI/GU:  normal bowel sounds, non-distended, non-tender MSK/SPINE: Lateral displacement of the left pinky finger at the PIP joint SKIN:  no rash, atraumatic PSYCH:  Appropriate speech and behavior  *Additional and/or pertinent findings included in MDM below  Diagnostic and Interventional Summary    EKG Interpretation  Date/Time:    Ventricular Rate:    PR Interval:    QRS Duration:   QT Interval:    QTC Calculation:   R Axis:     Text Interpretation:        Labs Reviewed - No data to display  DG Finger Little Left  Final Result    DG Finger Little Left  Final Result      Medications  HYDROmorphone (DILAUDID) injection 1 mg (1 mg Intramuscular Given 09/19/19 0336)     Procedures  /  Critical Care Reduction of left pinky finger dislocation  Date/Time: 09/19/2019 5:16 AM Performed by: Sabas Sous, MD Authorized by: Sabas Sous, MD  Local anesthesia used: no  Anesthesia: Local anesthesia used: no  Sedation: Patient sedated: no  Patient tolerance: patient tolerated the procedure well with no immediate complications Comments: Patient pretreated with 1 mg intramuscular Dilaudid.  Reduced with light traction.     ED Course and Medical Decision Making  I have reviewed the triage vital signs, the nursing notes, and pertinent available records from the EMR.  Listed above are laboratory and imaging tests that I personally ordered, reviewed, and interpreted and then considered in my medical decision making (see below for details).      X-ray without fracture, dislocation reduced as described above, prepare for discharge with hand surgery follow-up.  Repeat x-ray confirms reduction, also reveals small avulsion fracture.  Referred to hand surgery for further management.  Elmer Sow. Pilar Plate, MD [facility] Emergency [facility] [email or web address]  Final Clinical Impressions(s) / ED Diagnoses     ICD-10-CM   1. Dislocation of finger, initial encounter  [phone]     ED Discharge Orders    None       Discharge Instructions Discussed with and Provided to Patient:     Discharge Instructions     You were evaluated in the Emergency Department and after careful evaluation, we did not find any emergent condition requiring admission or further testing in the hospital.  Your exam/testing today was overall reassuring.  We reduced your finger dislocation here in the emergency department.  As discussed, it is important that you follow-up with the hand specialist for further management.  Keep the fingers secured together until you see the specialist.  Use Tylenol or Motrin at home for discomfort.  Please return to the Emergency Department if you experience any worsening of your condition.  We encourage you to follow up with a primary care provider.  Thank you for allowing [place] to be a part of your care.        Sabas Sous, MD 09/19/19 [id number]

## 2019-09-19 NOTE — ED Triage Notes (Signed)
Patient fell and injured her left pinky finger. Finger has obvious deformity and swelling.

## 2019-09-24 ENCOUNTER — Telehealth: Payer: Self-pay | Admitting: Orthopedic Surgery

## 2019-09-24 NOTE — Telephone Encounter (Signed)
Call received from patient this morning following Angel Moore Emergency room visit on 09/19/19 for problem of left small finger dislocation. Please review and advise schedule or other recommendation.

## 2019-09-24 NOTE — Telephone Encounter (Signed)
We have rules for this , I think.... this would be  I m not on call , px went to ER ....  So then it becomes a next new patient opening

## 2019-09-25 NOTE — Telephone Encounter (Signed)
Called back to patient (Dr Romeo Apple on call 09/19/19) to offer appointment; scheduled.

## 2019-09-28 ENCOUNTER — Other Ambulatory Visit: Payer: Self-pay

## 2019-09-28 ENCOUNTER — Encounter: Payer: Self-pay | Admitting: Orthopedic Surgery

## 2019-09-28 ENCOUNTER — Ambulatory Visit (INDEPENDENT_AMBULATORY_CARE_PROVIDER_SITE_OTHER): Payer: Medicare Other | Admitting: Orthopedic Surgery

## 2019-09-28 VITALS — BP 176/125 | HR 97 | Ht 62.0 in | Wt 211.0 lb

## 2019-09-28 DIAGNOSIS — S63289A Dislocation of proximal interphalangeal joint of unspecified finger, initial encounter: Secondary | ICD-10-CM

## 2019-09-28 NOTE — Patient Instructions (Signed)
Keep the finger taped for 2 weeks

## 2019-09-28 NOTE — Progress Notes (Signed)
EMERGENCY ROOM FOLLOW UP  NEW PROBLEM/PATIENT   Patient ID: Angel Moore, female   DOB: 03/09/1981, 39 y.o.   MRN: [id number]  Emergency room record from (date) July 7 has been reviewed and this is included by reference and includes the review of systems with the following addition:   Chief Complaint  Patient presents with  . Finger Injury    Left small inger dislocation DOI 09/19/19    HPI Angel Moore is a 39 y.o. female.  39 year old female presents with a 65-day-old lateral or ulnar dislocation of the left small finger at the PIP joint status post closed reduction in the ER  39 year old female fell on some steps injured her left small finger dislocating the PIP joint with a direct lateral dislocation she had a closed reduction in the emergency room she comes in complaining of pain and some stiffness little swelling at the PIP joint     Review of Systems Review of Systems  Skin: Negative.   Neurological: Negative.      has a past medical history of Anemia, Heart murmur, Hypertension, Stress headaches, and Tobacco abuse.   Past Surgical History:  Procedure Laterality Date  . BIOPSY  01/31/2018   Procedure: BIOPSY;  Surgeon: West Bali, MD;  Location: [facility];  Service: Endoscopy;;  Gastric  . CESAREAN SECTION  08/2007  . CESAREAN SECTION N/A 08/28/2019   Procedure: CESAREAN SECTION;  Surgeon: Levie Heritage, DO;  Location: [facility];  Service: Obstetrics;  Laterality: N/A;  . CHOLECYSTECTOMY, LAPAROSCOPIC  2006  . ESOPHAGOGASTRODUODENOSCOPY (EGD) WITH PROPOFOL N/A 01/31/2018   Procedure: ESOPHAGOGASTRODUODENOSCOPY (EGD) WITH PROPOFOL;  Surgeon: West Bali, MD;  Location: [facility];  Service: Endoscopy;  Laterality: N/A;  7:30am    Family History  Problem Relation Age of Onset  . Diabetes Brother   . Diabetes Maternal Grandfather   . Colon cancer Neg Hx   . Colon polyps Neg Hx     Social History Social History   Tobacco Use  . Smoking status:  Current Every Day Smoker    Packs/day: 0.25    Types: Cigarettes  . Smokeless tobacco: Never Used  Vaping Use  . Vaping Use: Never used  Substance Use Topics  . Alcohol use: Not Currently    Comment: occasionally  . Drug use: Not Currently    No Known Allergies  Current Outpatient Medications  Medication Sig Dispense Refill  . ferrous sulfate 325 (65 FE) MG tablet Take 325 mg by mouth daily with breakfast. (Patient not taking: Reported on 09/28/2019)    . hydrochlorothiazide (HYDRODIURIL) 25 MG tablet Take 1 tablet (25 mg total) by mouth daily. (Patient not taking: Reported on 09/28/2019) 20 tablet 0  . ibuprofen (ADVIL) 800 MG tablet Take 1 tablet (800 mg total) by mouth every 6 (six) hours. (Patient not taking: Reported on 09/28/2019) 30 tablet 0  . labetalol (NORMODYNE) 200 MG tablet Take 2 tablets (400 mg total) by mouth 3 (three) times daily. (Patient not taking: Reported on 09/28/2019) 180 tablet 6  . oxyCODONE-acetaminophen (PERCOCET/ROXICET) 5-325 MG tablet Take 1-2 tablets by mouth every 4 (four) hours as needed for moderate pain. (Patient not taking: Reported on 09/28/2019) 30 tablet 0  . Prenat-Fe Carbonyl-FA-Omega 3 (ONE-A-DAY WOMENS PRENATAL 1) 28-0.8-235 MG CAPS Take 1 tablet by mouth daily. (Patient not taking: Reported on 09/28/2019) 100 capsule 11   No current facility-administered medications for this visit.    Physical Exam BP (!) 176/125  Pulse 97   Ht 5\' 2"  (1.575 m)   Wt 211 lb (95.7 kg)   BMI 38.59 kg/m  Body mass index is 38.59 kg/m.  Well developed and well nourished  Stands with normal weight bearing line  Alert and oriented x 3  Normal affect and mood  Ortho Exam Left small finger alignment is normal she is tender over the collateral ligament on the radial side PIP joint flexion 25 degrees neurovascular exam is intact flexor tendons extensor tendons seem to be intact   Data Reviewed IMAGING From THE ER AND THE REPORT ARE REVIEWED, MY INTERPRETATION  OF THE IMAGE(S) IS : X-rays show a lateral dislocation no fracture dislocation seems to be direct ulnar or lateral PIP joint small finger  Assessment  PIP dislocation left small finger status post closed reduction  Plan   Buddy taping active range of motion for 2 weeks  Follow-up in 4 weeks  Acute injury x-rays were reviewed from outside source   Encounter Diagnosis  Name Primary?  . Dislocation of finger PIP joint, initial encounter Yes     , MD 09/28/2019 9:52 AM

## 2019-10-25 ENCOUNTER — Other Ambulatory Visit: Payer: Self-pay

## 2019-10-25 ENCOUNTER — Ambulatory Visit (INDEPENDENT_AMBULATORY_CARE_PROVIDER_SITE_OTHER): Payer: Medicare Other | Admitting: Orthopedic Surgery

## 2019-10-25 ENCOUNTER — Encounter: Payer: Self-pay | Admitting: Orthopedic Surgery

## 2019-10-25 ENCOUNTER — Ambulatory Visit: Payer: Medicare Other

## 2019-10-25 VITALS — BP 175/119 | HR 91 | Ht 62.0 in | Wt 212.0 lb

## 2019-10-25 DIAGNOSIS — S63287D Dislocation of proximal interphalangeal joint of left little finger, subsequent encounter: Secondary | ICD-10-CM

## 2019-10-25 DIAGNOSIS — S63289D Dislocation of proximal interphalangeal joint of unspecified finger, subsequent encounter: Secondary | ICD-10-CM

## 2019-10-25 NOTE — Progress Notes (Signed)
Chief Complaint  Patient presents with  . Hand Injury    09/19/19 has finger pain still sore left 5th, had re injury 1 week ago with increased pain swelling     39 years old seen in July with an injury PIP joint left small finger reinjured it 1 week ago on the fifth.  Got out of bed put her hand out it hit up against something because of pain and swelling again.  Some loss of motion reported no numbness or tingling  Review of systems negative for any related neurovascular dysfunction skin issues or fever.  No rash  BP (!) 175/119   Pulse 91   Ht 5\' 2"  (1.575 m)   Wt 212 lb (96.2 kg)   LMP 10/18/2019 (Approximate)   BMI 38.78 kg/m  She is awake alert and oriented x3 mood and affect are normal  Well-groomed.  Left small finger skin is intact.  PIP joint is swollen and tender.  Range of motion is limited at terminal flexion.  Motor exam otherwise normal in terms of the flexor and extensor tendons neurovascular exam also intact  Repeat x-ray shows congruent joint no fracture soft tissue swelling around the PIP joint  Recommend taping 2 to 3 weeks follow-up as needed  Acute uncomplicated injury low risk of morbidity.

## 2019-10-25 NOTE — Patient Instructions (Signed)
Tape for 2-3 weeks

## 2019-11-26 ENCOUNTER — Ambulatory Visit: Payer: Medicare Other | Admitting: Obstetrics & Gynecology

## 2020-05-02 ENCOUNTER — Other Ambulatory Visit: Payer: Self-pay

## 2020-05-02 ENCOUNTER — Emergency Department (HOSPITAL_COMMUNITY)
Admission: EM | Admit: 2020-05-02 | Discharge: 2020-05-03 | Disposition: A | Discharge status: Home / Self Care | Payer: Medicare Other | Attending: Emergency Medicine | Admitting: Emergency Medicine

## 2020-05-02 ENCOUNTER — Encounter (HOSPITAL_COMMUNITY): Payer: Self-pay | Admitting: Emergency Medicine

## 2020-05-02 DIAGNOSIS — I10 Essential (primary) hypertension: Secondary | ICD-10-CM | POA: Diagnosis not present

## 2020-05-02 DIAGNOSIS — O208 Other hemorrhage in early pregnancy: Secondary | ICD-10-CM | POA: Diagnosis not present

## 2020-05-02 DIAGNOSIS — Z79899 Other long term (current) drug therapy: Secondary | ICD-10-CM | POA: Diagnosis not present

## 2020-05-02 DIAGNOSIS — F17210 Nicotine dependence, cigarettes, uncomplicated: Secondary | ICD-10-CM | POA: Insufficient documentation

## 2020-05-02 DIAGNOSIS — O209 Hemorrhage in early pregnancy, unspecified: Secondary | ICD-10-CM

## 2020-05-02 DIAGNOSIS — R1030 Lower abdominal pain, unspecified: Secondary | ICD-10-CM | POA: Insufficient documentation

## 2020-05-02 LAB — COMPREHENSIVE METABOLIC PANEL
ALT: 13 U/L (ref 0–44)
AST: 19 U/L (ref 15–41)
Albumin: 3.7 g/dL (ref 3.5–5.0)
Alkaline Phosphatase: 78 U/L (ref 38–126)
Anion gap: 4 — ABNORMAL LOW (ref 5–15)
BUN: 10 mg/dL (ref 6–20)
CO2: 22 mmol/L (ref 22–32)
Calcium: 8.3 mg/dL — ABNORMAL LOW (ref 8.9–10.3)
Chloride: 111 mmol/L (ref 98–111)
Creatinine, Ser: 0.8 mg/dL (ref 0.44–1.00)
GFR, Estimated: >60 mL/min (ref >60)
Glucose, Bld: 99 mg/dL (ref 70–99)
Potassium: 3.7 mmol/L (ref 3.5–5.1)
Sodium: 137 mmol/L (ref 135–145)
Total Bilirubin: 0.4 mg/dL (ref 0.3–1.2)
Total Protein: 6.7 g/dL (ref 6.5–8.1)

## 2020-05-02 LAB — CBC
HCT: 33.5 % — ABNORMAL LOW (ref 36.0–46.0)
Hemoglobin: 9.9 g/dL — ABNORMAL LOW (ref 12.0–15.0)
MCH: 20.8 pg — ABNORMAL LOW (ref 26.0–34.0)
MCHC: 29.6 g/dL — ABNORMAL LOW (ref 30.0–36.0)
MCV: 70.5 fL — ABNORMAL LOW (ref 80.0–100.0)
Platelets: 374 10*3/uL (ref 150–400)
RBC: 4.75 MIL/uL (ref 3.87–5.11)
RDW: 18.6 % — ABNORMAL HIGH (ref 11.5–15.5)
WBC: 11 10*3/uL — ABNORMAL HIGH (ref 4.0–10.5)
nRBC: 0 % (ref 0.0–0.2)

## 2020-05-02 LAB — LIPASE, BLOOD: Lipase: 43 U/L (ref 11–51)

## 2020-05-02 NOTE — ED Triage Notes (Signed)
Pt c/o of sharp abdominal pain in lower abdomen x2 days. States she took a pregnancy test at home and states it was positive

## 2020-05-03 LAB — HCG, QUANTITATIVE, PREGNANCY: hCG, Beta Chain, Quant, S: 10 m[IU]/mL — ABNORMAL HIGH (ref <5)

## 2020-05-03 NOTE — ED Provider Notes (Signed)
[facility] EMERGENCY DEPARTMENT Provider Note   CSN: [id number] Arrival date & time: 05/02/20  1644     History Chief Complaint  Patient presents with  . Abdominal Pain    Angel Moore is a 40 y.o. female.  The history is provided by the patient.  Abdominal Pain Pain location:  LLQ and RLQ Pain quality: not aching   Pain severity:  Mild Onset quality:  Gradual Duration:  2 days Timing:  Intermittent Progression:  Unchanged Chronicity:  New Relieved by:  None tried Worsened by:  Nothing Associated symptoms: vaginal bleeding   Associated symptoms: no cough, no diarrhea, no dysuria, no fever and no vomiting   Patient with history of hypertension presents with lower abdominal pain and vaginal bleeding.  Patient reports her last menstrual cycle was January 12.  She reports that this month when she noted that her menstrual cycle was late, she was found to have a positive pregnancy test at home.  She took multiple tests at home.  Over the past day she began having vaginal bleeding.  She reports it started as mild spotting, and is now increasing in flow.  No fevers or vomiting.  Denies any other acute complaints.  She is a G3, P2 with 2 live children at home.  No previous history of ectopic pregnancy     Past Medical History:  Diagnosis Date  . Anemia   . Heart murmur   . Hypertension   . Stress headaches   . Tobacco abuse    1 PK/WEEK    Patient Active Problem List   Diagnosis Date Noted  . Chronic hypertension with superimposed pre-eclampsia 08/28/2019  . Abnormal chromosomal and genetic finding on antenatal screening mother 05/21/2019  . Sickle cell trait (HCC) 03/26/2019  . Supervision of high risk pregnancy, antepartum 03/20/2019  . Smoker 03/20/2019  . History of cesarean delivery 03/20/2019  . Chronic hypertension affecting pregnancy   . Dyspepsia 01/26/2018  . Iron deficiency anemia due to chronic blood loss 01/26/2018  . Constipation 01/26/2018    Past  Surgical History:  Procedure Laterality Date  . BIOPSY  01/31/2018   Procedure: BIOPSY;  Surgeon: West Bali, MD;  [facility]: [facility];  Service: Endoscopy;;  Gastric  . CESAREAN SECTION  08/2007  . CESAREAN SECTION N/A 08/28/2019   Procedure: CESAREAN SECTION;  Surgeon: Levie Heritage, DO;  Location: [facility];  Service: Obstetrics;  Laterality: N/A;  . CHOLECYSTECTOMY, LAPAROSCOPIC  2006  . ESOPHAGOGASTRODUODENOSCOPY (EGD) WITH PROPOFOL N/A 01/31/2018   Procedure: ESOPHAGOGASTRODUODENOSCOPY (EGD) WITH PROPOFOL;  Surgeon: West Bali, MD;  Location: [facility];  Service: Endoscopy;  Laterality: N/A;  7:30am     OB History    Gravida  3   Para  2   Term  2   Preterm      AB      Living  2     SAB      IAB      Ectopic      Multiple  0   Live Births  2           Family History  Problem Relation Age of Onset  . Diabetes Brother   . Diabetes Maternal Grandfather   . Colon cancer Neg Hx   . Colon polyps Neg Hx     Social History   Tobacco Use  . Smoking status: Current Every Day Smoker    Packs/day: 0.25    Types: Cigarettes  .  Smokeless tobacco: Never Used  Vaping Use  . Vaping Use: Never used  Substance Use Topics  . Alcohol use: Not Currently    Comment: occasionally  . Drug use: Not Currently    Home Medications Prior to Admission medications   Medication Sig Start Date End Date Taking? Authorizing Provider  diphenhydramine-acetaminophen (TYLENOL PM) 25-500 MG TABS tablet Take 1 tablet by mouth at bedtime as needed.   Yes [provider]  hydrochlorothiazide (HYDRODIURIL) 25 MG tablet Take 1 tablet (25 mg total) by mouth daily. Patient not taking: No sig reported 08/30/19   Adam Phenix, MD  labetalol (NORMODYNE) 200 MG tablet Take 2 tablets (400 mg total) by mouth 3 (three) times daily. Patient not taking: No sig reported 08/07/19   Lazaro Arms, MD    Allergies    Patient has no known allergies.  Review of  Systems   Review of Systems  Constitutional: Negative for fever.  Respiratory: Negative for cough.   Gastrointestinal: Positive for abdominal pain. Negative for diarrhea and vomiting.  Genitourinary: Positive for vaginal bleeding. Negative for dysuria.  All other systems reviewed and are negative.   Physical Exam Updated Vital Signs BP (!) 133/53   Pulse 88   Temp 97.8 F (36.6 C) (Oral)   Resp (!) 28   Wt 99.7 kg   LMP 03/26/2020   SpO2 94%   BMI 40.20 kg/m   Physical Exam  CONSTITUTIONAL: Well developed/well nourished HEAD: Normocephalic/atraumatic EYES: EOMI/PERRL ENMT: Mucous membranes moist NECK: supple no meningeal signs SPINE/BACK:entire spine nontender CV: S1/S2 noted, no murmurs/rubs/gallops noted LUNGS: Lungs are clear to auscultation bilaterally, no apparent distress ABDOMEN: soft, nontender, no rebound or guarding, bowel sounds noted throughout abdomen GU:no cva tenderness, patient refuses pelvic exam NEURO: Pt is awake/alert/appropriate, moves all extremitiesx4.  No facial droop.   EXTREMITIES: pulses normal/equal, full ROM SKIN: warm, color normal PSYCH: no abnormalities of mood noted, alert and oriented to situation  ED Results / Procedures / Treatments   Labs (all labs ordered are listed, but only abnormal results are displayed) Labs Reviewed  COMPREHENSIVE METABOLIC PANEL - Abnormal; Notable for the following components:      Result Value   Calcium 8.3 (*)    Anion gap 4 (*)    All other components within normal limits  CBC - Abnormal; Notable for the following components:   WBC 11.0 (*)    Hemoglobin 9.9 (*)    HCT 33.5 (*)    MCV 70.5 (*)    MCH 20.8 (*)    MCHC 29.6 (*)    RDW 18.6 (*)    All other components within normal limits  HCG, QUANTITATIVE, PREGNANCY - Abnormal; Notable for the following components:   hCG, Beta Chain, Quant, S 10 (*)    All other components within normal limits  LIPASE, BLOOD    EKG None  Radiology No  results found.  Procedures Procedures   Medications Ordered in ED Medications - No data to display  ED Course  I have reviewed the triage vital signs and the nursing notes.  Pertinent labs results that were available during my care of the patient were reviewed by me and considered in my medical decision making (see chart for details).    MDM Rules/Calculators/A&P                          Patient presents for abdominal discomfort and vaginal bleeding.  She had home pregnancy  test that were positive. Patient reports the bleeding began as spotting and then has progressed and they have also been passing clots and fetal products She reports only mild pain.  Patient is in no acute distress and is very well-appearing.  Denies known history of ectopic pregnancy.  Patient continues to refuse a pelvic exam.  She reports the vaginal bleeding is manageable and similar to menstrual cycle type bleeding  Patient's beta hCG is at 10.  My strong suspicion is that patient is having a miscarriage in process, lower suspicion for ectopic pregnancy At a long discussion with patient, and she would prefer to be discharged rather than have an emergent ultrasound. Given patient's clinical stability and wellbeing she is very reliable, this is a reasonable plan. I did asked patient to have a repeat beta-hCG done at her local OB/GYN in 48 hours We discussed strict return precautions Final Clinical Impression(s) / ED Diagnoses Final diagnoses:  Vaginal bleeding affecting early pregnancy    Rx / DC Orders ED Discharge Orders    None       Zadie Rhine, MD 05/03/20 0210

## 2020-05-08 ENCOUNTER — Other Ambulatory Visit: Payer: Self-pay | Admitting: Advanced Practice Midwife

## 2020-05-08 ENCOUNTER — Other Ambulatory Visit: Payer: Self-pay

## 2020-05-08 ENCOUNTER — Ambulatory Visit (INDEPENDENT_AMBULATORY_CARE_PROVIDER_SITE_OTHER): Payer: Medicare Other | Admitting: Advanced Practice Midwife

## 2020-05-08 ENCOUNTER — Encounter: Payer: Self-pay | Admitting: Advanced Practice Midwife

## 2020-05-08 VITALS — BP 164/100 | HR 88 | Ht 62.0 in | Wt 223.0 lb

## 2020-05-08 DIAGNOSIS — O3680X0 Pregnancy with inconclusive fetal viability, not applicable or unspecified: Secondary | ICD-10-CM | POA: Diagnosis not present

## 2020-05-08 NOTE — Patient Instructions (Signed)
Surgery to Prevent Pregnancy Sterilization is surgery to prevent pregnancy. Sterilization is permanent. It should only be done if you are sure that you do not want to have children. For females, the fallopian tubes are either blocked or closed off. When the fallopian tubes are closed, the eggs that the ovaries release cannot enter the uterus, sperm cannot reach the eggs, and pregnancy is prevented. For males, the vas deferens is cut and then tied or burned (cauterized). The vas deferens is a tube that carries sperm from the testicles. This procedure prevents pregnancy by blocking sperm from going through the vas deferens and penis during ejaculation. Types of sterilization For females, the surgeries include:  Laparoscopic tubal ligation. In this surgery, the fallopian tubes are tied off, sealed with heat, or blocked with a clip, ring, or clamp. A small portion of each fallopian tube may also be removed. This surgery is done through several small cuts (incisions) with special instruments that are inserted into the abdomen.  Postpartum tubal ligation. This is also called a mini-laparotomy. This surgery is done right after childbirth or 1 or 2 days after childbirth. In this surgery, the fallopian tubes are tied off, sealed with heat, or blocked with a clip, ring, or clamp. A small portion of each fallopian tube may also be removed. The surgery is done through a single incision in the abdomen.  Tubal ligation during a C-section. In this surgery, the fallopian tubes are tied off, sealed with heat, or blocked with a clip, ring, or clamp. A small portion of each fallopian tube may also be removed. The surgery is done at the same time as a C-section delivery. For males, the surgeries include:  Incision vasectomy. In this surgery, one or two small incisions are made in the scrotum. The vas deferens will be pulled out of the scrotum and cut. The vas deferens will be tied off or sealed with heat and placed back  into your scrotum. The incision will be closed with absorbable stitches (sutures).  No scalpel vasectomy. In this surgery, a punctured opening is made in the scrotum. The vas deferens will be pulled out of the scrotum and cut. The vas deferens will be tied off or sealed with heat and placed back into your scrotum. The opening is small and will not require sutures.      What are the benefits of sterilization?  It is usually effective for a lifetime.  The procedures are generally safe.  For females, sterilization does not affect the hormones, like other types of birth control. Because of this, menstrual periods will not be affected.  For both males and females, sexual desire and sexual performance will not be affected. What are the disadvantages of sterilization? Risks from the surgery Generally, sterilization is safe. Complications are rare. However, there are some risks. They include:  Bleeding.  Infection.  Reaction to medicine used during the procedure.  Injury to surrounding organs. Risks after sterilization After a successful surgery, you may have other problems. Female sterilization risks may include:  Failure of the procedure. Sterilization is nearly 100% effective, but it can fail. In rare cases, the fallopian tubes can grow back together over time. If this happens, a female will be able to get pregnant again.  A higher risk of having an ectopic pregnancy. An ectopic pregnancy is a pregnancy that grows outside of the uterus. This kind of pregnancy can lead to serious bleeding if it is not treated. Female sterilization risks may include:  Bleeding and   swelling of the scrotum.  Failure of the procedure. There is a very small chance that the tied or cauterized ends of the vas deferens may reconnect (recanalization). If this happens, a female could still make a female pregnant. Other risks may include:  A risk that you may change your mind and decide you want have children.  Sterilization may be reversed, but a reversal is not always successful.  Lack of protection against sexually transmitted infections (STIs). What happens during the procedure? The steps of the procedure depend on the type of sterilization you are having. The procedure may vary among health care providers and hospitals. Questions to ask your health care provider  How effective are sterilization procedures?  What type of procedure is right for me?  Is it possible to reverse the procedure if I change my mind?  What can I expect after the procedure? Where to find more information [employer]: Rob[employer].cy [place] Department of Health and Human Services: ConventionalMedicines.si [facility]: www.urologyhealth.org Summary  Sterilization is surgery to prevent pregnancy.  There are different types of sterilization surgeries.  Sterilization may be reversed, but a reversal is not always successful.  Sterilization does not protect against STIs. This information is not intended to replace advice given to you by your health care provider. Make sure you discuss any questions you have with your health care provider. Document Revised: 12/01/2019 Document Reviewed: 12/01/2019 Elsevier Patient Education  2021 Elsevier Inc. Laparoscopic Tubal Ligation, Care After The following information offers guidance on how to care for yourself after your procedure. Your health care provider may also give you more specific instructions. If you have problems or questions, contact your health care provider. What can I expect after the procedure? After the procedure, it is common to have:  A sore throat if general anesthesia was used.  Pain in shoulders, back, and abdomen. This is caused by the gas that was used during the procedure.  Mild discomfort or cramping in your abdomen.  Pain or soreness in the area where the surgical incision was made.  A bloated  feeling.  Tiredness.  Nausea and vomiting. Follow these instructions at home: Medicines  Take over-the-counter and prescription medicines only as told by your health care provider.  Ask your health care provider if the medicine prescribed to you: ? Requires you to avoid driving or using heavy machinery. ? Can cause constipation. You may need to take these actions to prevent or treat constipation:  Drink enough fluid to keep your urine pale yellow.  Take over-the-counter or prescription medicines.  Eat foods that are high in fiber, such as beans, whole grains, and fresh fruits and vegetables.  Limit foods that are high in fat and processed sugars, such as fried or sweet foods.  Do not take aspirin because it can cause bleeding. Incision care  Follow instructions from your health care provider about how to take care of your incision. Make sure you: ? Wash your hands with soap and water for at least 20 seconds before and after you change your bandage (dressing). If soap and water are not available, use hand sanitizer. ? Change your dressing as told by your health care provider. ? Leave stitches (sutures), skin glue, or adhesive strips in place. These skin closures may need to stay in place for 2 weeks or longer. If adhesive strip edges start to loosen and curl up, you may trim the loose edges. Do not remove adhesive strips completely unless your health  care provider tells you to do that.  Check your incision area every day for signs of infection. Check for: ? Redness, swelling, or more pain. ? Fluid or blood. ? Warmth. ? Pus or a bad smell.      Activity  Rest as told by your health care provider.  Avoid sitting for a long time without moving. Get up to take short walks every 1-2 hours. This is important to improve blood flow and breathing. Ask for help if you feel weak or unsteady.  Do not have sex, douche, or put a tampon or anything else in your vagina for 6 weeks or as  long as told by your health care provider.  Do not lift anything that is heavier than your baby for 2 weeks, or the limit that you are told, until your health care provider says that it is safe.  Do not take baths, swim, or use a hot tub until your health care provider approves. Ask your health care provider if you may take showers. You may only be allowed to take sponge baths.  Return to your normal activities as told by your health care provider. Ask your health care provider what activities are safe for you. General instructions  After the procedure you may need to wear a sanitary pad for vaginal discharge.  Have someone help you with your daily household tasks for the first few days.  Keep all follow-up visits. This is important. Contact a health care provider if:  You have redness, swelling, or more pain around your incision.  Your incision feels warm to the touch.  You have pus or a bad smell coming from your incision.  The edges of your incision break open after the sutures have been removed.  Your pain does not improve after 2-3 days.  You have a rash.  You repeatedly become dizzy or light-headed.  Your pain medicine is not helping. Get help right away if:  You have a fever or chills.  You faint.  You have increasing pain in your abdomen.  You have severe pain in one or both of your shoulders.  You have fluid or blood coming from your sutures or heavy bleeding from your vagina.  You have shortness of breath or difficulty breathing.  You have chest pain, leg pain, or leg swelling.  You have ongoing nausea, vomiting, or diarrhea. These symptoms may represent a serious problem that is an emergency. Do not wait to see if the symptoms will go away. Get medical help right away. Call your local emergency services (911 in the [place] Do not drive yourself to the hospital. Summary  After the procedure, it is common to have mild discomfort or cramping in your  abdomen.  After the procedure you may need to wear a sanitary pad for vaginal discharge.  Take over-the-counter and prescription medicines only as told by your health care provider.  Watch for symptoms that should prompt you to call your health care provider.  Keep all follow-up visits. This is important. This information is not intended to replace advice given to you by your health care provider. Make sure you discuss any questions you have with your health care provider. Document Revised: 11/16/2019 Document Reviewed: 11/16/2019 Elsevier Patient Education  2021 [employer].

## 2020-05-08 NOTE — Progress Notes (Signed)
[facility] Clinic Visit  Patient name: Angel Moore MRN [id number]  Date of birth: 13-Sep-1980  CC & HPI:  Angel Moore is a 40 y.o. African American female presenting today for F/U ED.  She had several + UPTs at home when she was late for period, mid February. Went ot ED 2/19 w/bleeding like a period  HCG was 10 at that time. Declined [place], given ectopic precautions  Today, bleeding is very light  Pertinent History Reviewed:  Medical & Surgical Hx:   Past Medical History:  Diagnosis Date  . Anemia   . Heart murmur   . Hypertension   . Stress headaches   . Tobacco abuse    1 PK/WEEK   Past Surgical History:  Procedure Laterality Date  . BIOPSY  01/31/2018   Procedure: BIOPSY;  Surgeon: West Bali, MD;  Location: [facility] ENDO SUITE;  Service: Endoscopy;;  Gastric  . CESAREAN SECTION  08/2007  . CESAREAN SECTION N/A 08/28/2019   Procedure: CESAREAN SECTION;  Surgeon: Levie Heritage, DO;  Location: [facility] LD ORS;  Service: Obstetrics;  Laterality: N/A;  . CHOLECYSTECTOMY, LAPAROSCOPIC  2006  . ESOPHAGOGASTRODUODENOSCOPY (EGD) WITH PROPOFOL N/A 01/31/2018   Procedure: ESOPHAGOGASTRODUODENOSCOPY (EGD) WITH PROPOFOL;  Surgeon: West Bali, MD;  Location: [facility] SU[facility];  Service: Endoscopy;  Laterality: N/A;  7:30am   Family History  Problem Relation Age of Onset  . Diabetes Brother   . Diabetes Maternal Grandfather   . Colon cancer Neg Hx   . Colon polyps Neg Hx     Current Outpatient Medications:  .  diphenhydramine-acetaminophen (TYLENOL PM) 25-500 MG TABS tablet, Take 1 tablet by mouth at bedtime as needed. (Patient not taking: Reported on 05/08/2020), Disp: , Rfl:  .  hydrochlorothiazide (HYDRODIURIL) 25 MG tablet, Take 1 tablet (25 mg total) by mouth daily. (Patient not taking: No sig reported), Disp: 20 tablet, Rfl: 0 .  labetalol (NORMODYNE) 200 MG tablet, Take 2 tablets (400 mg total) by mouth 3 (three) times daily. (Patient not taking: No sig reported), Disp: 180  tablet, Rfl: 6 Social History: Reviewed -  reports that she has been smoking cigarettes. She has been smoking about 0.25 packs per day. She has never used smokeless tobacco.  Review of Systems:   Constitutional: Negative for fever and chills Eyes: Negative for visual disturbances Respiratory: Negative for shortness of breath, dyspnea Cardiovascular: Negative for chest pain or palpitations  Gastrointestinal: Negative for vomiting, diarrhea and constipation; no abdominal pain Genitourinary: Negative for dysuria and urgency, vaginal irritation or itching Musculoskeletal: Negative for back pain, joint pain, myalgias  Neurological: Negative for dizziness and headaches    Objective Findings:    Physical Examination: Vitals:   05/08/20 1431  BP: (!) 164/100  Pulse: 88   General appearance - well appearing, and in no distress Mental status - alert, oriented to person, place, and time Chest:  Normal respiratory effort Heart - normal rate and regular rhythm Abdomen:  Soft, nontender Pelvic: deferred Musculoskeletal:  Normal range of motion without pain Extremities:  No edema    No results found for this or any previous visit (from the past 24 hour(s)).    Assessment & Plan:  A:   Probable SAB  Wants BTL P:  HCG today, should be <10     Return for sign BTL form; Preop BTL w/[facility] in about 2 weeks.  Jacklyn Shell CNM 05/08/2020 3:01 PM

## 2020-05-09 LAB — BETA HCG QUANT (REF LAB): hCG Quant: <1 m[IU]/mL

## 2020-05-22 ENCOUNTER — Ambulatory Visit (INDEPENDENT_AMBULATORY_CARE_PROVIDER_SITE_OTHER): Payer: Medicare Other | Admitting: Obstetrics & Gynecology

## 2020-05-22 ENCOUNTER — Other Ambulatory Visit: Payer: Self-pay

## 2020-05-22 ENCOUNTER — Encounter: Payer: Self-pay | Admitting: Obstetrics & Gynecology

## 2020-05-22 VITALS — BP 148/102 | HR 81 | Ht 62.0 in | Wt 218.0 lb

## 2020-05-22 DIAGNOSIS — Z302 Encounter for sterilization: Secondary | ICD-10-CM

## 2020-05-22 DIAGNOSIS — Z3009 Encounter for other general counseling and advice on contraception: Secondary | ICD-10-CM

## 2020-05-22 MED ORDER — AMLODIPINE BESYLATE 10 MG PO TABS: 10.0000 mg | ORAL_TABLET | Freq: Every day | ORAL | 2 refills | Status: DC

## 2020-06-16 ENCOUNTER — Other Ambulatory Visit: Payer: Self-pay

## 2020-06-16 ENCOUNTER — Encounter (HOSPITAL_COMMUNITY)
Admission: RE | Admit: 2020-06-16 | Discharge: 2020-06-16 | Disposition: A | Discharge status: Home / Self Care | Payer: Medicare Other | Source: Ambulatory Visit | Attending: Obstetrics & Gynecology | Admitting: Obstetrics & Gynecology

## 2020-06-16 ENCOUNTER — Other Ambulatory Visit: Payer: Self-pay | Admitting: Obstetrics & Gynecology

## 2020-06-16 ENCOUNTER — Encounter (HOSPITAL_COMMUNITY): Payer: Self-pay

## 2020-06-16 ENCOUNTER — Other Ambulatory Visit (HOSPITAL_COMMUNITY)
Admission: RE | Admit: 2020-06-16 | Discharge: 2020-06-16 | Disposition: A | Discharge status: Home / Self Care | Payer: Medicare Other | Source: Ambulatory Visit | Attending: Obstetrics & Gynecology | Admitting: Obstetrics & Gynecology

## 2020-06-16 DIAGNOSIS — Z01818 Encounter for other preprocedural examination: Secondary | ICD-10-CM | POA: Diagnosis present

## 2020-06-16 DIAGNOSIS — Z20822 Contact with and (suspected) exposure to covid-19: Secondary | ICD-10-CM | POA: Insufficient documentation

## 2020-06-16 LAB — CBC
HCT: 30.4 % — ABNORMAL LOW (ref 36.0–46.0)
Hemoglobin: 8.9 g/dL — ABNORMAL LOW (ref 12.0–15.0)
MCH: 20 pg — ABNORMAL LOW (ref 26.0–34.0)
MCHC: 29.3 g/dL — ABNORMAL LOW (ref 30.0–36.0)
MCV: 68.3 fL — ABNORMAL LOW (ref 80.0–100.0)
Platelets: 335 10*3/uL (ref 150–400)
RBC: 4.45 MIL/uL (ref 3.87–5.11)
RDW: 18.4 % — ABNORMAL HIGH (ref 11.5–15.5)
WBC: 12.1 10*3/uL — ABNORMAL HIGH (ref 4.0–10.5)
nRBC: 0 % (ref 0.0–0.2)

## 2020-06-16 LAB — URINALYSIS, ROUTINE W REFLEX MICROSCOPIC
Bilirubin Urine: NEGATIVE
Glucose, UA: NEGATIVE mg/dL
Hgb urine dipstick: NEGATIVE
Ketones, ur: NEGATIVE mg/dL
Leukocytes,Ua: NEGATIVE
Nitrite: NEGATIVE
Protein, ur: NEGATIVE mg/dL
Specific Gravity, Urine: 1.011 (ref 1.005–1.030)
pH: 7 (ref 5.0–8.0)

## 2020-06-16 LAB — RAPID HIV SCREEN (HIV 1/2 AB+AG)
HIV 1/2 Antibodies: NONREACTIVE
HIV-1 P24 Antigen - HIV24: NONREACTIVE

## 2020-06-16 LAB — COMPREHENSIVE METABOLIC PANEL
ALT: 13 U/L (ref 0–44)
AST: 20 U/L (ref 15–41)
Albumin: 3.5 g/dL (ref 3.5–5.0)
Alkaline Phosphatase: 71 U/L (ref 38–126)
Anion gap: 9 (ref 5–15)
BUN: 12 mg/dL (ref 6–20)
CO2: 23 mmol/L (ref 22–32)
Calcium: 8.2 mg/dL — ABNORMAL LOW (ref 8.9–10.3)
Chloride: 105 mmol/L (ref 98–111)
Creatinine, Ser: 0.69 mg/dL (ref 0.44–1.00)
GFR, Estimated: >60 mL/min (ref >60)
Glucose, Bld: 83 mg/dL (ref 70–99)
Potassium: 3.8 mmol/L (ref 3.5–5.1)
Sodium: 137 mmol/L (ref 135–145)
Total Bilirubin: 0.6 mg/dL (ref 0.3–1.2)
Total Protein: 6.8 g/dL (ref 6.5–8.1)

## 2020-06-16 LAB — HCG, QUANTITATIVE, PREGNANCY: hCG, Beta Chain, Quant, S: <1 m[IU]/mL (ref <5)

## 2020-06-16 NOTE — Patient Instructions (Signed)
Angel Moore  06/16/2020     @PREFPERIOPPHARMACY @   Your procedure is scheduled on 06/18/2020.  Report to 08/18/2020 at 11:30 A.M.  Call this number if you have problems the morning of surgery:  [phone]   Remember:  Do not eat or drink after midnight.   Drink your ERAS drink at 8;30 am the morning of surgery.  Do not drink anything else after that.      Take these medicines the morning of surgery with A SIP OF WATER : Amlodipine    Do not wear jewelry, make-up or nail polish.  Do not wear lotions, powders, or perfumes, or deodorant.  Do not shave 48 hours prior to surgery.  Men may shave face and neck.  Do not bring valuables to the hospital.  [facility] is not responsible for any belongings or valuables.  Contacts, dentures or bridgework may not be worn into surgery.  Leave your suitcase in the car.  After surgery it may be brought to your room.  For patients admitted to the hospital, discharge time will be determined by your treatment team.  Patients discharged the day of surgery will not be allowed to drive home.   Name and phone number of your driver:   Family  Salpingectomy Salpingectomy, also called tubectomy, is the surgical removal of one of the fallopian tubes. The fallopian tubes are where eggs travel from the ovaries to the uterus. Removing one fallopian tube does not prevent you from becoming pregnant. It also does not cause problems with your menstrual periods. You may need this procedure if you:  Have a fertilized egg that attaches to the fallopian tube (ectopic pregnancy), especially one that causes the tube to burst or tear (rupture).  Have an infected fallopian tube.  Have cancer of the fallopian tube or nearby organs.  Have had an ovary removed due to a cyst or tumor.  Have had your uterus removed.  Are at high risk for ovarian cancer. There are three different methods that can be used for a salpingectomy:  An open method that involves making  one large incision in your abdomen.  A laparoscopic method that involves using a thin, lighted tube with a tiny camera on the end (laparoscope) to help perform the procedure. The laparoscope will allow your surgeon to make several small incisions in the abdomen instead of one large incision.  A robot-assisted method that involves using a computer to control surgical instruments that are attached to robotic arms. Tell a health care provider about:  Any allergies you have.  All medicines you are taking, including vitamins, herbs, eye drops, creams, and over-the-counter medicines.  Any problems you or family members have had with anesthetic medicines.  Any blood disorders you have.  Any surgeries you have had.  Any medical conditions you have.  Whether you are pregnant or may be pregnant. What are the risks? Generally, this is a safe procedure. However, problems may occur, including:  Infection.  Bleeding.  Allergic reactions to medicines.  Blood clots in the legs or lungs.  Damage to other structures or organs. What happens before the procedure? Medicines  Ask your health care provider about: ? Changing or stopping your regular medicines. This is especially important if you are taking diabetes medicines or blood thinners. ? Taking medicines such as aspirin and ibuprofen. These medicines can thin your blood. Do not take these medicines unless your health care provider tells you to take them. ? Taking over-the-counter medicines,  vitamins, herbs, and supplements. Staying hydrated Follow instructions from your health care provider about hydration, which may include:  Up to 2 hours before the procedure - you may continue to drink clear liquids, such as water, clear fruit juice, black coffee, and plain tea. Eating and drinking restrictions Follow instructions from your health care provider about eating and drinking, which may include:  8 hours before the procedure - stop eating  heavy meals or foods, such as meat, fried foods, or fatty foods.  6 hours before the procedure - stop eating light meals or foods, such as toast or cereal.  6 hours before the procedure - stop drinking milk or drinks that contain milk.  2 hours before the procedure - stop drinking clear liquids. General instructions  Do not use any products that contain nicotine or tobacco for at least 4 weeks before the procedure. These products include cigarettes, e-cigarettes, and chewing tobacco. If you need help quitting, ask your health care provider.  You may have an exam or tests, such as: ? An electrocardiogram (ECG). ? A blood or urine test.  Ask your health care provider what steps will be taken to help prevent infection. These may include: ? Removing hair at the surgery site. ? Washing skin with a germ-killing soap. ? Taking antibiotic medicine.  Plan to have someone take you home from the hospital or clinic.  If you will be going home right after the procedure, plan to have someone with you for 24 hours. What happens during the procedure?  An IV will be inserted into one of your veins.  You will be given one or more of the following: ? A medicine to help you relax (sedative). ? A medicine to make you fall asleep (general anesthetic).  A thin tube (catheter) may be inserted through your urethra and into your bladder to drain urine during your procedure.  Depending on the type of procedure you are having, one incision or several small incisions will be made in your abdomen.  Your fallopian tube will be cut and removed from where it attaches to your uterus.  Your blood vessels will be clamped and tied to prevent excess bleeding.  The incisions in your abdomen will be closed with stitches (sutures), staples, or skin glue.  A bandage (dressing) may be placed over your incisions. The procedure may vary among health care providers and hospitals. What happens after the  procedure?  Your blood pressure, heart rate, breathing rate, and blood oxygen level will be monitored until you leave the hospital.  You may continue to receive fluids and medicines through an IV.  You may continue to have a catheter draining your urine.  You may have to wear compression stockings. These stockings help to prevent blood clots and reduce swelling in your legs.  You will be given pain medicine as needed.  Do not drive for 24 hours if you were given a sedative during your procedure.   Summary  Salpingectomy is a surgical procedure to remove one of the fallopian tubes.  The procedure may be done with an open incision, a laparoscope, or computer-controlled instruments.  Depending on the type of procedure you are having, one incision or several small incisions will be made in your abdomen.  Your blood pressure, heart rate, breathing rate, and blood oxygen level will be monitored until you leave the hospital.  Plan to have someone take you home from the hospital or clinic. This information is not intended to replace advice given  to you by your health care provider. Make sure you discuss any questions you have with your health care provider. Document Revised: 02/20/2018 Document Reviewed: 02/20/2018 Elsevier Patient Education  2021 Elsevier Inc[employer]  Special instructions:  N/a   Please use the Chlorhexidine Wash the night before the surgery and the morning of surgery.  Please do not use the wash on your face, hair and private areas.    Please read over the following fact sheets that you were given. Care and Recovery After Surgery     General Anesthesia, Adult General anesthesia is the use of medicines to make a person "go to sleep" (unconscious) for a medical procedure. General anesthesia must be used for certain procedures, and is often recommended for procedures that:  Last a long time.  Require you to be still or in an unusual position.  Are major and can cause  blood loss. The medicines used for general anesthesia are called general anesthetics. As well as making you unconscious for a certain amount of time, these medicines:  Prevent pain.  Control your blood pressure.  Relax your muscles. Tell a health care provider about:  Any allergies you have.  All medicines you are taking, including vitamins, herbs, eye drops, creams, and over-the-counter medicines.  Any problems you or family members have had with anesthetic medicines.  Types of anesthetics you have had in the past.  Any blood disorders you have.  Any surgeries you have had.  Any medical conditions you have.  Any recent upper respiratory, chest, or ear infections.  Any history of: ? Heart or lung conditions, such as heart failure, sleep apnea, asthma, or chronic obstructive pulmonary disease (COPD). ? Financial plannerMilitary service. ? Depression or anxiety.  Any tobacco or drug use, including marijuana or alcohol use.  Whether you are pregnant or may be pregnant. What are the risks? Generally, this is a safe procedure. However, problems may occur, including:  Allergic reaction.  Lung and heart problems.  Inhaling food or liquid from the stomach into the lungs (aspiration).  Nerve injury.  Dental injury.  Air in the bloodstream, which can lead to stroke.  Extreme agitation or confusion (delirium) when you wake up from the anesthetic.  Waking up during your procedure and being unable to move. This is rare. These problems are more likely to develop if you are having a major surgery or if you have an advanced or serious medical condition. You can prevent some of these complications by answering all of your health care provider's questions thoroughly and by following all instructions before your procedure. General anesthesia can cause side effects, including:  Nausea or vomiting.  A sore throat from the breathing tube.  Hoarseness.  Wheezing or coughing.  Shaking  chills.  Tiredness.  Body aches.  Anxiety.  Sleepiness or drowsiness.  Confusion or agitation. What happens before the procedure? Staying hydrated Follow instructions from your health care provider about hydration, which may include:  Up to 2 hours before the procedure - you may continue to drink clear liquids, such as water, clear fruit juice, black coffee, and plain tea.   Eating and drinking restrictions Follow instructions from your health care provider about eating and drinking, which may include:  8 hours before the procedure - stop eating heavy meals or foods such as meat, fried foods, or fatty foods.  6 hours before the procedure - stop eating light meals or foods, such as toast or cereal.  6 hours before the procedure - stop drinking milk  or drinks that contain milk.  2 hours before the procedure - stop drinking clear liquids. Medicines Ask your health care provider about:  Changing or stopping your regular medicines. This is especially important if you are taking diabetes medicines or blood thinners.  Taking medicines such as aspirin and ibuprofen. These medicines can thin your blood. Do not take these medicines unless your health care provider tells you to take them.  Taking over-the-counter medicines, vitamins, herbs, and supplements. Do not take these during the week before your procedure unless your health care provider approves them. General instructions  Starting 3-6 weeks before the procedure, do not use any products that contain nicotine or tobacco, such as cigarettes and e-cigarettes. If you need help quitting, ask your health care provider.  If you brush your teeth on the morning of the procedure, make sure to spit out all of the toothpaste.  Tell your health care provider if you become ill or develop a cold, cough, or fever.  If instructed by your health care provider, bring your sleep apnea device with you on the day of your surgery (if  applicable).  Ask your health care provider if you will be going home the same day, the following day, or after a longer hospital stay. ? Plan to have a responsible adult take you home from the hospital or clinic. ? Plan to have a responsible adult care for you for the time you are told after you leave the hospital or clinic. This is important. What happens during the procedure?  You will be given anesthetics through both of the following: ? A mask placed over your nose and mouth. ? An IV in one of your veins.  You may receive a medicine to help you relax (sedative).  After you are unconscious, a breathing tube may be inserted down your throat to help you breathe. This will be removed before you wake up.  An anesthesia specialist will stay with you throughout your procedure. He or she will: ? Keep you comfortable and safe by continuing to give you medicines and adjusting the amount of medicine that you get. ? Monitor your blood pressure, pulse, and oxygen levels to make sure that the anesthetics do not cause any problems. The procedure may vary among health care providers and hospitals.   What happens after the procedure?  Your blood pressure, temperature, heart rate, breathing rate, and blood oxygen level will be monitored until the medicines you were given have worn off.  You will wake up in a recovery area. You may wake up slowly.  If you feel anxious or agitated, you may be given medicine to help you calm down.  If you will be going home the same day, your health care provider may check to make sure you can walk, drink, and urinate.  Your health care provider will treat any pain or side effects you have before you go home.  Do not drive or operate machinery until your health care provider says that it is safe. Summary  General anesthesia is used to keep you still and prevent pain during a procedure.  It is important to tell your health care provider about your medical history  and any surgeries you have had, and previous experience with anesthesia.  Follow your health care provider's instructions about when to stop eating, drinking, or taking certain medicines before your procedure.  Plan to have a responsible adult take you home from the hospital or clinic. This information is not intended to  replace advice given to you by your health care provider. Make sure you discuss any questions you have with your health care provider. Document Revised: 11/12/2019 Document Reviewed: 06/13/2019 Elsevier Patient Education  2021 [employer].

## 2020-06-17 LAB — SARS CORONAVIRUS 2 (TAT 6-24 HRS): SARS Coronavirus 2: NEGATIVE

## 2020-06-17 NOTE — Pre-Procedure Instructions (Signed)
Dr Despina Hidden notified of H&H. No orders given.

## 2020-06-18 ENCOUNTER — Encounter (HOSPITAL_COMMUNITY)
Admission: RE | Disposition: A | Discharge status: Home / Self Care | Payer: Self-pay | Source: Home / Self Care | Attending: Obstetrics & Gynecology

## 2020-06-18 ENCOUNTER — Ambulatory Visit (HOSPITAL_COMMUNITY)
Admission: RE | Admit: 2020-06-18 | Discharge: 2020-06-18 | Disposition: A | Discharge status: Home / Self Care | Payer: Medicare Other | Attending: Obstetrics & Gynecology | Admitting: Obstetrics & Gynecology

## 2020-06-18 ENCOUNTER — Ambulatory Visit (HOSPITAL_COMMUNITY): Payer: Medicare Other | Admitting: Anesthesiology

## 2020-06-18 ENCOUNTER — Encounter (HOSPITAL_COMMUNITY): Payer: Self-pay | Admitting: Obstetrics & Gynecology

## 2020-06-18 DIAGNOSIS — Z302 Encounter for sterilization: Secondary | ICD-10-CM

## 2020-06-18 DIAGNOSIS — Z4003 Encounter for prophylactic removal of fallopian tube(s): Secondary | ICD-10-CM | POA: Diagnosis present

## 2020-06-18 DIAGNOSIS — F17210 Nicotine dependence, cigarettes, uncomplicated: Secondary | ICD-10-CM | POA: Diagnosis not present

## 2020-06-18 HISTORY — PX: LAPAROSCOPIC BILATERAL SALPINGECTOMY: SHX5889

## 2020-06-18 SURGERY — SALPINGECTOMY, BILATERAL, LAPAROSCOPIC
Anesthesia: General | Laterality: Bilateral

## 2020-06-18 MED ORDER — BUPIVACAINE LIPOSOME 1.3 % IJ SUSP
INTRAMUSCULAR | Status: DC | PRN
  Administered 2020-06-18: 20 mL

## 2020-06-18 MED ORDER — PROPOFOL 10 MG/ML IV BOLUS
INTRAVENOUS | Status: AC
  Filled 2020-06-18: qty 20

## 2020-06-18 MED ORDER — MIDAZOLAM HCL 5 MG/5ML IJ SOLN
INTRAMUSCULAR | Status: DC | PRN
  Administered 2020-06-18: 2 mg via INTRAVENOUS

## 2020-06-18 MED ORDER — BUPIVACAINE LIPOSOME 1.3 % IJ SUSP
20.0000 mL | Freq: Once | INTRAMUSCULAR | Status: DC
  Filled 2020-06-18: qty 20

## 2020-06-18 MED ORDER — LIDOCAINE 2% (20 MG/ML) 5 ML SYRINGE
INTRAMUSCULAR | Status: DC | PRN
  Administered 2020-06-18: 60 mg via INTRAVENOUS

## 2020-06-18 MED ORDER — ONDANSETRON HCL 4 MG/2ML IJ SOLN
INTRAMUSCULAR | Status: AC
  Filled 2020-06-18: qty 2

## 2020-06-18 MED ORDER — ROCURONIUM BROMIDE 10 MG/ML (PF) SYRINGE
PREFILLED_SYRINGE | INTRAVENOUS | Status: AC
  Filled 2020-06-18: qty 10

## 2020-06-18 MED ORDER — LIDOCAINE HCL (PF) 2 % IJ SOLN
INTRAMUSCULAR | Status: AC
  Filled 2020-06-18: qty 5

## 2020-06-18 MED ORDER — CEFAZOLIN SODIUM-DEXTROSE 2-4 GM/100ML-% IV SOLN
INTRAVENOUS | Status: AC
  Filled 2020-06-18: qty 100

## 2020-06-18 MED ORDER — DEXAMETHASONE SODIUM PHOSPHATE 10 MG/ML IJ SOLN
INTRAMUSCULAR | Status: DC | PRN
  Administered 2020-06-18: 10 mg via INTRAVENOUS

## 2020-06-18 MED ORDER — MIDAZOLAM HCL 2 MG/2ML IJ SOLN
INTRAMUSCULAR | Status: AC
  Filled 2020-06-18: qty 2

## 2020-06-18 MED ORDER — ONDANSETRON HCL 4 MG/2ML IJ SOLN
INTRAMUSCULAR | Status: DC | PRN
  Administered 2020-06-18: 4 mg via INTRAVENOUS

## 2020-06-18 MED ORDER — FENTANYL CITRATE (PF) 250 MCG/5ML IJ SOLN
INTRAMUSCULAR | Status: DC | PRN
  Administered 2020-06-18: 100 ug via INTRAVENOUS
  Administered 2020-06-18 (×2): 50 ug via INTRAVENOUS

## 2020-06-18 MED ORDER — PROPOFOL 10 MG/ML IV BOLUS
INTRAVENOUS | Status: DC | PRN
  Administered 2020-06-18: 200 mg via INTRAVENOUS

## 2020-06-18 MED ORDER — LACTATED RINGERS IV SOLN: INTRAVENOUS | Status: DC

## 2020-06-18 MED ORDER — BUPIVACAINE LIPOSOME 1.3 % IJ SUSP
INTRAMUSCULAR | Status: AC
  Filled 2020-06-18: qty 20

## 2020-06-18 MED ORDER — SODIUM CHLORIDE 0.9 % IR SOLN
Status: DC | PRN
  Administered 2020-06-18: 1000 mL

## 2020-06-18 MED ORDER — ORAL CARE MOUTH RINSE: 15.0000 mL | Freq: Once | OROMUCOSAL | Status: AC

## 2020-06-18 MED ORDER — HYDROCODONE-ACETAMINOPHEN 5-325 MG PO TABS: 1.0000 | ORAL_TABLET | Freq: Four times a day (QID) | ORAL | 0 refills | Status: DC | PRN

## 2020-06-18 MED ORDER — KETOROLAC TROMETHAMINE 30 MG/ML IJ SOLN
INTRAMUSCULAR | Status: AC
  Filled 2020-06-18: qty 1

## 2020-06-18 MED ORDER — KETOROLAC TROMETHAMINE 30 MG/ML IJ SOLN
30.0000 mg | Freq: Once | INTRAMUSCULAR | Status: AC
  Administered 2020-06-18: 30 mg via INTRAVENOUS

## 2020-06-18 MED ORDER — SUGAMMADEX SODIUM 200 MG/2ML IV SOLN
INTRAVENOUS | Status: DC | PRN
  Administered 2020-06-18: 200 mg via INTRAVENOUS

## 2020-06-18 MED ORDER — ONDANSETRON 8 MG PO TBDP: 8.0000 mg | ORAL_TABLET | Freq: Three times a day (TID) | ORAL | 0 refills | Status: DC | PRN

## 2020-06-18 MED ORDER — ETODOLAC 400 MG PO TABS: 400.0000 mg | ORAL_TABLET | Freq: Two times a day (BID) | ORAL | 0 refills | Status: DC

## 2020-06-18 MED ORDER — CHLORHEXIDINE GLUCONATE 0.12 % MT SOLN
15.0000 mL | Freq: Once | OROMUCOSAL | Status: AC
  Administered 2020-06-18: 15 mL via OROMUCOSAL

## 2020-06-18 MED ORDER — FENTANYL CITRATE (PF) 250 MCG/5ML IJ SOLN
INTRAMUSCULAR | Status: AC
  Filled 2020-06-18: qty 5

## 2020-06-18 MED ORDER — CEFAZOLIN SODIUM-DEXTROSE 2-4 GM/100ML-% IV SOLN
2.0000 g | INTRAVENOUS | Status: AC
  Administered 2020-06-18: 2 g via INTRAVENOUS

## 2020-06-18 MED ORDER — ROCURONIUM BROMIDE 10 MG/ML (PF) SYRINGE
PREFILLED_SYRINGE | INTRAVENOUS | Status: DC | PRN
  Administered 2020-06-18: 60 mg via INTRAVENOUS

## 2020-06-18 MED ORDER — DEXAMETHASONE SODIUM PHOSPHATE 10 MG/ML IJ SOLN
INTRAMUSCULAR | Status: AC
  Filled 2020-06-18: qty 1

## 2020-06-18 MED ORDER — POVIDONE-IODINE 10 % EX SWAB: 2.0000 "application " | Freq: Once | CUTANEOUS | Status: DC

## 2020-06-18 SURGICAL SUPPLY — 37 items
ADH SKN CLS APL DERMABOND .7 (GAUZE/BANDAGES/DRESSINGS) ×1
BAG HAMPER (MISCELLANEOUS) ×2 IMPLANT
BLADE SURG SZ11 CARB STEEL (BLADE) ×2 IMPLANT
CLOTH BEACON ORANGE TIMEOUT ST (SAFETY) ×2 IMPLANT
COVER LIGHT HANDLE STERIS (MISCELLANEOUS) ×4 IMPLANT
COVER WAND RF STERILE (DRAPES) ×2 IMPLANT
DERMABOND ADVANCED (GAUZE/BANDAGES/DRESSINGS) ×1
DERMABOND ADVANCED .7 DNX12 (GAUZE/BANDAGES/DRESSINGS) ×1 IMPLANT
ELECT REM PT RETURN 9FT ADLT (ELECTROSURGICAL) ×2
ELECTRODE REM PT RTRN 9FT ADLT (ELECTROSURGICAL) ×1 IMPLANT
GAUZE 4X4 16PLY RFD (DISPOSABLE) ×2 IMPLANT
GLOVE ECLIPSE 8.0 STRL XLNG CF (GLOVE) ×2 IMPLANT
GLOVE SRG 8 PF TXTR STRL LF DI (GLOVE) ×1 IMPLANT
GLOVE SURG UNDER POLY LF SZ7 (GLOVE) ×6 IMPLANT
GLOVE SURG UNDER POLY LF SZ8 (GLOVE) ×2
GOWN STRL REUS W/TWL LRG LVL3 (GOWN DISPOSABLE) ×2 IMPLANT
GOWN STRL REUS W/TWL XL LVL3 (GOWN DISPOSABLE) ×2 IMPLANT
INST SET LAPROSCOPIC GYN AP (KITS) ×2 IMPLANT
KIT TURNOVER CYSTO (KITS) ×2 IMPLANT
NEEDLE HYPO 18GX1.5 BLUNT FILL (NEEDLE) ×2 IMPLANT
NEEDLE HYPO 21X1.5 SAFETY (NEEDLE) ×2 IMPLANT
NEEDLE INSUFFLATION 14GA 120MM (NEEDLE) ×2 IMPLANT
PACK PERI GYN (CUSTOM PROCEDURE TRAY) ×2 IMPLANT
PAD ARMBOARD 7.5X6 YLW CONV (MISCELLANEOUS) ×2 IMPLANT
SET BASIN LINEN APH (SET/KITS/TRAYS/PACK) ×2 IMPLANT
SET TUBE SMOKE EVAC HIGH FLOW (TUBING) ×2 IMPLANT
SHEARS HARMONIC ACE PLUS 36CM (ENDOMECHANICALS) ×2 IMPLANT
SLEEVE ENDOPATH XCEL 5M (ENDOMECHANICALS) ×2 IMPLANT
SOL ANTI FOG 6CC (MISCELLANEOUS) ×1 IMPLANT
SOLUTION ANTI FOG 6CC (MISCELLANEOUS) ×1
SUT VICRYL 0 UR6 27IN ABS (SUTURE) ×2 IMPLANT
SUT VICRYL AB 3-0 FS1 BRD 27IN (SUTURE) ×4 IMPLANT
SYR 10ML LL (SYRINGE) ×2 IMPLANT
SYR 20ML LL LF (SYRINGE) ×4 IMPLANT
TROCAR ENDO BLADELESS 11MM (ENDOMECHANICALS) ×2 IMPLANT
TROCAR XCEL NON-BLD 5MMX100MML (ENDOMECHANICALS) ×2 IMPLANT
WARMER LAPAROSCOPE (MISCELLANEOUS) ×2 IMPLANT

## 2020-06-18 NOTE — Anesthesia Preprocedure Evaluation (Signed)
Anesthesia Evaluation  Patient identified by MRN, date of birth, ID band Patient awake    Reviewed: Allergy & Precautions, H&P , NPO status , Patient's Chart, lab work & pertinent test results, reviewed documented beta blocker date and time   Airway Mallampati: II  TM Distance: >3 FB Neck ROM: full    Dental no notable dental hx.    Pulmonary neg pulmonary ROS, Current Smoker,    Pulmonary exam normal breath sounds clear to auscultation       Cardiovascular Exercise Tolerance: Good hypertension, negative cardio ROS   Rhythm:regular Rate:Normal     Neuro/Psych negative neurological ROS  negative psych ROS   GI/Hepatic negative GI ROS, Neg liver ROS,   Endo/Other  Morbid obesity  Renal/GU negative Renal ROS  negative genitourinary   Musculoskeletal   Abdominal   Peds  Hematology  (+) Blood dyscrasia, anemia ,   Anesthesia Other Findings   Reproductive/Obstetrics negative OB ROS                             Anesthesia Physical Anesthesia Plan  ASA: III  Anesthesia Plan: General   Post-op Pain Management:    Induction:   PONV Risk Score and Plan: Propofol infusion  Airway Management Planned:   Additional Equipment:   Intra-op Plan:   Post-operative Plan:   Informed Consent: I have reviewed the patients History and Physical, chart, labs and discussed the procedure including the risks, benefits and alternatives for the proposed anesthesia with the patient or authorized representative who has indicated his/her understanding and acceptance.     Dental Advisory Given  Plan Discussed with: CRNA  Anesthesia Plan Comments:         Anesthesia Quick Evaluation

## 2020-06-18 NOTE — Transfer of Care (Signed)
Immediate Anesthesia Transfer of Care Note  Patient: Angel Moore  Procedure(s) Performed: LAPAROSCOPIC BILATERAL SALPINGECTOMY (Bilateral )  Patient Location: PACU  Anesthesia Type:General  Level of Consciousness: awake, alert , oriented and patient cooperative  Airway & Oxygen Therapy: Patient Spontanous Breathing and Patient connected to nasal cannula oxygen  Post-op Assessment: Report given to RN, Post -op Vital signs reviewed and stable and Patient moving all extremities  Post vital signs: Reviewed and stable  Last Vitals:  Vitals Value Taken Time  BP 115/70 06/18/20 1245  Temp    Pulse 91 06/18/20 1246  Resp 20 06/18/20 1246  SpO2 98 % 06/18/20 1246  Vitals shown include unvalidated device data.  Last Pain:  Vitals:   06/18/20 1121  PainSc: 0-No pain         Complications: No complications documented.

## 2020-06-18 NOTE — Anesthesia Procedure Notes (Signed)
Procedure Name: Intubation Date/Time: 06/18/2020 11:44 AM Performed by: Myna Bright, CRNA Pre-anesthesia Checklist: Patient identified, Emergency Drugs available, Suction available and Patient being monitored Patient Re-evaluated:Patient Re-evaluated prior to induction Oxygen Delivery Method: Circle system utilized Preoxygenation: Pre-oxygenation with 100% oxygen Induction Type: IV induction Ventilation: Mask ventilation without difficulty Laryngoscope Size: Mac and 3 Grade View: Grade I Tube type: Oral Tube size: 7.0 mm Number of attempts: 1 Airway Equipment and Method: Stylet Placement Confirmation: ETT inserted through vocal cords under direct vision,  positive ETCO2 and breath sounds checked- equal and bilateral Secured at: 21 cm Tube secured with: Tape Dental Injury: Teeth and Oropharynx as per pre-operative assessment

## 2020-06-18 NOTE — Discharge Instructions (Signed)
Laparoscopic Tubal Ligation, Care After The following information offers guidance on how to care for yourself after your procedure. Your health care provider may also give you more specific instructions. If you have problems or questions, contact your health care provider. What can I expect after the procedure? After the procedure, it is common to have:  A sore throat if general anesthesia was used.  Pain in shoulders, back, and abdomen. This is caused by the gas that was used during the procedure.  Mild discomfort or cramping in your abdomen.  Pain or soreness in the area where the surgical incision was made.  A bloated feeling.  Tiredness.  Nausea and vomiting. Follow these instructions at home: Medicines  Take over-the-counter and prescription medicines only as told by your health care provider.  Ask your health care provider if the medicine prescribed to you: ? Requires you to avoid driving or using heavy machinery. ? Can cause constipation. You may need to take these actions to prevent or treat constipation:  Drink enough fluid to keep your urine pale yellow.  Take over-the-counter or prescription medicines.  Eat foods that are high in fiber, such as beans, whole grains, and fresh fruits and vegetables.  Limit foods that are high in fat and processed sugars, such as fried or sweet foods.  Do not take aspirin because it can cause bleeding. Incision care  Follow instructions from your health care provider about how to take care of your incision. Make sure you: ? Wash your hands with soap and water for at least 20 seconds before and after you change your bandage (dressing). If soap and water are not available, use hand sanitizer. ? Change your dressing as told by your health care provider. ? Leave stitches (sutures), skin glue, or adhesive strips in place. These skin closures may need to stay in place for 2 weeks or longer. If adhesive strip edges start to loosen and curl  up, you may trim the loose edges. Do not remove adhesive strips completely unless your health care provider tells you to do that.  Check your incision area every day for signs of infection. Check for: ? Redness, swelling, or more pain. ? Fluid or blood. ? Warmth. ? Pus or a bad smell.      Activity  Rest as told by your health care provider.  Avoid sitting for a long time without moving. Get up to take short walks every 1-2 hours. This is important to improve blood flow and breathing. Ask for help if you feel weak or unsteady.  Do not have sex, douche, or put a tampon or anything else in your vagina for 6 weeks or as long as told by your health care provider.  Do not lift anything that is heavier than your baby for 2 weeks, or the limit that you are told, until your health care provider says that it is safe.  Do not take baths, swim, or use a hot tub until your health care provider approves. Ask your health care provider if you may take showers. You may only be allowed to take sponge baths.  Return to your normal activities as told by your health care provider. Ask your health care provider what activities are safe for you. General instructions  After the procedure you may need to wear a sanitary pad for vaginal discharge.  Have someone help you with your daily household tasks for the first few days.  Keep all follow-up visits. This is important. Contact a health   care provider if:  You have redness, swelling, or more pain around your incision.  Your incision feels warm to the touch.  You have pus or a bad smell coming from your incision.  The edges of your incision break open after the sutures have been removed.  Your pain does not improve after 2-3 days.  You have a rash.  You repeatedly become dizzy or light-headed.  Your pain medicine is not helping. Get help right away if:  You have a fever or chills.  You faint.  You have increasing pain in your  abdomen.  You have severe pain in one or both of your shoulders.  You have fluid or blood coming from your sutures or heavy bleeding from your vagina.  You have shortness of breath or difficulty breathing.  You have chest pain, leg pain, or leg swelling.  You have ongoing nausea, vomiting, or diarrhea. These symptoms may represent a serious problem that is an emergency. Do not wait to see if the symptoms will go away. Get medical help right away. Call your local emergency services (911 in the [place] Do not drive yourself to the hospital. Summary  After the procedure, it is common to have mild discomfort or cramping in your abdomen.  After the procedure you may need to wear a sanitary pad for vaginal discharge.  Take over-the-counter and prescription medicines only as told by your health care provider.  Watch for symptoms that should prompt you to call your health care provider.  Keep all follow-up visits. This is important. This information is not intended to replace advice given to you by your health care provider. Make sure you discuss any questions you have with your health care provider. Document Revised: 11/16/2019 Document Reviewed: 11/16/2019 Elsevier Patient Education  2021 Elsevier Inc.     General Anesthesia, Adult, Care After This sheet gives you information about how to care for yourself after your procedure. Your health care provider may also give you more specific instructions. If you have problems or questions, contact your health care provider. What can I expect after the procedure? After the procedure, the following side effects are common:  Pain or discomfort at the IV site.  Nausea.  Vomiting.  Sore throat.  Trouble concentrating.  Feeling cold or chills.  Feeling weak or tired.  Sleepiness and fatigue.  Soreness and body aches. These side effects can affect parts of the body that were not involved in surgery. Follow these instructions at  home: For the time period you were told by your health care provider:  Rest.  Do not participate in activities where you could fall or become injured.  Do not drive or use machinery.  Do not drink alcohol.  Do not take sleeping pills or medicines that cause drowsiness.  Do not make important decisions or sign legal documents.  Do not take care of children on your own.   Eating and drinking  Follow any instructions from your health care provider about eating or drinking restrictions.  When you feel hungry, start by eating small amounts of foods that are soft and easy to digest (bland), such as toast. Gradually return to your regular diet.  Drink enough fluid to keep your urine pale yellow.  If you vomit, rehydrate by drinking water, juice, or clear broth. General instructions  If you have sleep apnea, surgery and certain medicines can increase your risk for breathing problems. Follow instructions from your health care provider about wearing your sleep device: ? Anytime  you are sleeping, including during daytime naps. ? While taking prescription pain medicines, sleeping medicines, or medicines that make you drowsy.  Have a responsible adult stay with you for the time you are told. It is important to have someone help care for you until you are awake and alert.  Return to your normal activities as told by your health care provider. Ask your health care provider what activities are safe for you.  Take over-the-counter and prescription medicines only as told by your health care provider.  If you smoke, do not smoke without supervision.  Keep all follow-up visits as told by your health care provider. This is important. Contact a health care provider if:  You have nausea or vomiting that does not get better with medicine.  You cannot eat or drink without vomiting.  You have pain that does not get better with medicine.  You are unable to pass urine.  You develop a skin  rash.  You have a fever.  You have redness around your IV site that gets worse. Get help right away if:  You have difficulty breathing.  You have chest pain.  You have blood in your urine or stool, or you vomit blood. Summary  After the procedure, it is common to have a sore throat or nausea. It is also common to feel tired.  Have a responsible adult stay with you for the time you are told. It is important to have someone help care for you until you are awake and alert.  When you feel hungry, start by eating small amounts of foods that are soft and easy to digest (bland), such as toast. Gradually return to your regular diet.  Drink enough fluid to keep your urine pale yellow.  Return to your normal activities as told by your health care provider. Ask your health care provider what activities are safe for you. This information is not intended to replace advice given to you by your health care provider. Make sure you discuss any questions you have with your health care provider. Document Revised: 11/15/2019 Document Reviewed: 06/14/2019 Elsevier Patient Education  2021 Elsevier Inc.    [place] THE [place] [facility] [place] UNTIL Sunday June 22, 2020. DO NOT USE ADDITIONAL NUMBING MEDICATIONS WITHOUT CONSULTING A PHYSICIAN UNTIL AFTER Sunday.  Bupivacaine Liposomal Suspension for Injection What is this medicine? BUPIVACAINE LIPOSOMAL (bue PIV a kane LIP oh som al) is an anesthetic. It causes loss of feeling in the skin or other tissues. It is used to prevent and to treat pain from some procedures. This medicine may be used for other purposes; ask your health care provider or pharmacist if you have questions. COMMON BRAND NAME(S): EXPAREL What should I tell my health care provider before I take this medicine? They need to know if you have any of these conditions:  G6PD deficiency  heart disease  kidney disease  liver disease  low blood pressure  lung or breathing  disease, like asthma  an unusual or allergic reaction to bupivacaine, other medicines, foods, dyes, or preservatives  pregnant or trying to get pregnant  breast-feeding How should I use this medicine? This medicine is injected into the affected area. It is given by a health care provider in a hospital or clinic setting. Talk to your health care provider about the use of this medicine in children. While it may be given to children as young as 6 years for selected conditions, precautions do apply. Overdosage: If you think you have taken  too much of this medicine contact a poison control center or emergency room at once. NOTE: This medicine is only for you. Do not share this medicine with others. What if I miss a dose? This does not apply. What may interact with this medicine? This medicine may interact with the following medications:  acetaminophen  certain antibiotics like dapsone, nitrofurantoin, aminosalicylic acid, sulfonamides  certain medicines for seizures like phenobarbital, phenytoin, valproic acid  chloroquine  cyclophosphamide  flutamide  hydroxyurea  ifosfamide  metoclopramide  nitric oxide  nitroglycerin  nitroprusside  nitrous oxide  other local anesthetics like lidocaine, pramoxine, tetracaine  primaquine  quinine  rasburicase  sulfasalazine This list may not describe all possible interactions. Give your health care provider a list of all the medicines, herbs, non-prescription drugs, or dietary supplements you use. Also tell them if you smoke, drink alcohol, or use illegal drugs. Some items may interact with your medicine. What should I watch for while using this medicine? Your condition will be monitored carefully while you are receiving this medicine. Be careful to avoid injury while the area is numb, and you are not aware of pain. What side effects may I notice from receiving this medicine? Side effects that you should report to your doctor or  health care professional as soon as possible:  allergic reactions like skin rash, itching or hives, swelling of the face, lips, or tongue  seizures  signs and symptoms of a dangerous change in heartbeat or heart rhythm like chest pain; dizziness; fast, irregular heartbeat; palpitations; feeling faint or lightheaded; falls; breathing problems  signs and symptoms of methemoglobinemia such as pale, gray, or blue colored skin; headache; fast heartbeat; shortness of breath; feeling faint or lightheaded, falls; tiredness Side effects that usually do not require medical attention (report to your doctor or health care professional if they continue or are bothersome):  anxious  back pain  changes in taste  changes in vision  constipation  dizziness  fever  nausea, vomiting This list may not describe all possible side effects. Call your doctor for medical advice about side effects. You may report side effects to FDA at 1-800-FDA-1088. Where should I keep my medicine? This drug is given in a hospital or clinic and will not be stored at home. NOTE: This sheet is a summary. It may not cover all possible information. If you have questions about this medicine, talk to your doctor, pharmacist, or health care provider.  2021 Elsevier/Gold Standard (2019-06-07 12:24:57)

## 2020-06-18 NOTE — H&P (Signed)
Preoperative History and Physical  Angel Moore is a 40 y.o. [id number] with Patient's last menstrual period was 05/31/2020. admitted for a laparoscopic bilaterals salpingectomy for the purpose of permanent sterilization Pt opted for salpingectomy for lower failure rate and ovarian cancer risk reduction.    PMH:    Past Medical History:  Diagnosis Date  . Anemia   . Heart murmur   . Hypertension   . Stress headaches   . Tobacco abuse    1 PK/WEEK    PSH:     Past Surgical History:  Procedure Laterality Date  . BIOPSY  01/31/2018   Procedure: BIOPSY;  Surgeon: West Bali, MD;  Location: [facility];  Service: Endoscopy;;  Gastric  . CESAREAN SECTION  08/2007  . CESAREAN SECTION N/A 08/28/2019   Procedure: CESAREAN SECTION;  Surgeon: Levie Heritage, DO;  Location: [facility];  Service: Obstetrics;  Laterality: N/A;  . CHOLECYSTECTOMY, LAPAROSCOPIC  2006  . ESOPHAGOGASTRODUODENOSCOPY (EGD) WITH PROPOFOL N/A 01/31/2018   Procedure: ESOPHAGOGASTRODUODENOSCOPY (EGD) WITH PROPOFOL;  Surgeon: West Bali, MD;  Location: [facility];  Service: Endoscopy;  Laterality: N/A;  7:30am    POb/GynH:      OB History    Gravida  3   Para  2   Term  2   Preterm      AB  1   Living  2     SAB  1   IAB      Ectopic      Multiple  0   Live Births  2           SH:   Social History   Tobacco Use  . Smoking status: Current Every Day Smoker    Packs/day: 0.25    Types: Cigarettes  . Smokeless tobacco: Never Used  Vaping Use  . Vaping Use: Never used  Substance Use Topics  . Alcohol use: Not Currently    Comment: occasionally  . Drug use: Not Currently    FH:    Family History  Problem Relation Age of Onset  . Diabetes Brother   . Diabetes Maternal Grandfather   . Colon cancer Neg Hx   . Colon polyps Neg Hx      Allergies: No Known Allergies  Medications:       Current Facility-Administered Medications:  .  bupivacaine liposome (EXPAREL) 1.3  % injection 266 mg, 20 mL, Infiltration, Once, Ladarien Beeks, Amaryllis Dyke, MD .  ceFAZolin (ANCEF) 2-4 GM/100ML-% IVPB, , , ,  .  ceFAZolin (ANCEF) IVPB 2g/100 mL premix, 2 g, Intravenous, On Call to OR, Lazaro Arms, MD .  ketorolac (TORADOL) 30 MG/ML injection, , , ,  .  lactated ringers infusion, , Intravenous, Continuous, Kiel, Mosetta Putt, MD, Last Rate: 10 mL/hr at 06/18/20 1127, New Bag at 06/18/20 1127 .  povidone-iodine 10 % swab 2 application, 2 application, Topical, Once, Kensie Susman, Amaryllis Dyke, MD  Review of Systems:   Review of Systems  Constitutional: Negative for fever, chills, weight loss, malaise/fatigue and diaphoresis.  HENT: Negative for hearing loss, ear pain, nosebleeds, congestion, sore throat, neck pain, tinnitus and ear discharge.   Eyes: Negative for blurred vision, double vision, photophobia, pain, discharge and redness.  Respiratory: Negative for cough, hemoptysis, sputum production, shortness of breath, wheezing and stridor.   Cardiovascular: Negative for chest pain, palpitations, orthopnea, claudication, leg swelling and PND.  Gastrointestinal: Positive for abdominal pain. Negative for heartburn, nausea, vomiting, diarrhea, constipation, blood in  stool and melena.  Genitourinary: Negative for dysuria, urgency, frequency, hematuria and flank pain.  Musculoskeletal: Negative for myalgias, back pain, joint pain and falls.  Skin: Negative for itching and rash.  Neurological: Negative for dizziness, tingling, tremors, sensory change, speech change, focal weakness, seizures, loss of consciousness, weakness and headaches.  Endo/Heme/Allergies: Negative for environmental allergies and polydipsia. Does not bruise/bleed easily.  Psychiatric/Behavioral: Negative for depression, suicidal ideas, hallucinations, memory loss and substance abuse. The patient is not nervous/anxious and does not have insomnia.      PHYSICAL EXAM:  Blood pressure (!) 159/109, pulse 89, temperature 98.9 F (37.2  C), resp. rate 16, last menstrual period 05/31/2020, SpO2 99 %, unknown if currently breastfeeding.    Vitals reviewed. Constitutional: She is oriented to person, place, and time. She appears well-developed and well-nourished.  HENT:  Head: Normocephalic and atraumatic.  Right Ear: External ear normal.  Left Ear: External ear normal.  Nose: Nose normal.  Mouth/Throat: Oropharynx is clear and moist.  Eyes: Conjunctivae and EOM are normal. Pupils are equal, round, and reactive to light. Right eye exhibits no discharge. Left eye exhibits no discharge. No scleral icterus.  Neck: Normal range of motion. Neck supple. No tracheal deviation present. No thyromegaly present.  Cardiovascular: Normal rate, regular rhythm, normal heart sounds and intact distal pulses.  Exam reveals no gallop and no friction rub.   No murmur heard. Respiratory: Effort normal and breath sounds normal. No respiratory distress. She has no wheezes. She has no rales. She exhibits no tenderness.  GI: Soft. Bowel sounds are normal. She exhibits no distension and no mass. There is tenderness. There is no rebound and no guarding.  Genitourinary:       Vulva is normal without lesions Vagina is pink moist without discharge Cervix normal in appearance and pap is normal Uterus is normal size, contour, position, consistency, mobility, non-tender Adnexa is negative with normal sized ovaries by sonogram  Musculoskeletal: Normal range of motion. She exhibits no edema and no tenderness.  Neurological: She is alert and oriented to person, place, and time. She has normal reflexes. She displays normal reflexes. No cranial nerve deficit. She exhibits normal muscle tone. Coordination normal.  Skin: Skin is warm and dry. No rash noted. No erythema. No pallor.  Psychiatric: She has a normal mood and affect. Her behavior is normal. Judgment and thought content normal.    Labs: Results for orders placed or performed during the hospital  encounter of 06/16/20 (from the past 336 hour(s))  SARS CORONAVIRUS 2 (TAT 6-24 HRS) Nasopharyngeal Nasopharyngeal Swab   Collection Time: 06/16/20  1:27 PM   Specimen: Nasopharyngeal Swab  Result Value Ref Range   SARS Coronavirus 2 NEGATIVE NEGATIVE  CBC   Collection Time: 06/16/20  2:29 PM  Result Value Ref Range   WBC 12.1 (H) 4.0 - 10.5 K/uL   RBC 4.45 3.87 - 5.11 MIL/uL   Hemoglobin 8.9 (L) 12.0 - 15.0 g/dL   HCT 06.3 (L) 01.6 - 01.0 %   MCV 68.3 (L) 80.0 - 100.0 fL   MCH 20.0 (L) 26.0 - 34.0 pg   MCHC 29.3 (L) 30.0 - 36.0 g/dL   RDW 93.2 (H) 35.5 - 73.2 %   Platelets 335 150 - 400 K/uL   nRBC 0.0 0.0 - 0.2 %  Comprehensive metabolic panel   Collection Time: 06/16/20  2:29 PM  Result Value Ref Range   Sodium 137 135 - 145 mmol/L   Potassium 3.8 3.5 - 5.1 mmol/L  Chloride 105 98 - 111 mmol/L   CO2 23 22 - 32 mmol/L   Glucose, Bld 83 70 - 99 mg/dL   BUN 12 6 - 20 mg/dL   Creatinine, Ser 7.10 0.44 - 1.00 mg/dL   Calcium 8.2 (L) 8.9 - 10.3 mg/dL   Total Protein 6.8 6.5 - 8.1 g/dL   Albumin 3.5 3.5 - 5.0 g/dL   AST 20 15 - 41 U/L   ALT 13 0 - 44 U/L   Alkaline Phosphatase 71 38 - 126 U/L   Total Bilirubin 0.6 0.3 - 1.2 mg/dL   GFR, Estimated >62 >69 mL/min   Anion gap 9 5 - 15  hCG, quantitative, pregnancy   Collection Time: 06/16/20  2:29 PM  Result Value Ref Range   hCG, Beta Chain, Quant, S <1 <5 mIU/mL  Rapid HIV screen (HIV 1/2 Ab+Ag)   Collection Time: 06/16/20  2:29 PM  Result Value Ref Range   HIV-1 P24 Antigen - HIV24 NON REACTIVE NON REACTIVE   HIV 1/2 Antibodies NON REACTIVE NON REACTIVE   Interpretation (HIV Ag Ab)      A non reactive test result means that HIV 1 or HIV 2 antibodies and HIV 1 p24 antigen were not detected in the specimen.  Urinalysis, Routine w reflex microscopic Urine, Clean Catch   Collection Time: 06/16/20  2:29 PM  Result Value Ref Range   Color, Urine STRAW (A) YELLOW   APPearance CLEAR CLEAR   Specific Gravity, Urine 1.011  1.005 - 1.030   pH 7.0 5.0 - 8.0   Glucose, UA NEGATIVE NEGATIVE mg/dL   Hgb urine dipstick NEGATIVE NEGATIVE   Bilirubin Urine NEGATIVE NEGATIVE   Ketones, ur NEGATIVE NEGATIVE mg/dL   Protein, ur NEGATIVE NEGATIVE mg/dL   Nitrite NEGATIVE NEGATIVE   Leukocytes,Ua NEGATIVE NEGATIVE    EKG: Orders placed or performed during the hospital encounter of 06/16/20  . EKG 12-Lead  . EKG 12-Lead    Imaging Studies: No results found.    Assessment: Multiparous female desires permanent sterilization, opts for bilateral salpingectomy   Plan: Laparoscopic bilateral salpingectomy for permanent sterilization  Lazaro Arms 06/18/2020 11:29 AM

## 2020-06-18 NOTE — Anesthesia Postprocedure Evaluation (Signed)
Anesthesia Post Note  Patient: Angel Moore  Procedure(s) Performed: LAPAROSCOPIC BILATERAL SALPINGECTOMY (Bilateral )  Patient location during evaluation: Phase II Anesthesia Type: General Level of consciousness: awake Pain management: pain level controlled Vital Signs Assessment: post-procedure vital signs reviewed and stable Respiratory status: spontaneous breathing and respiratory function stable Cardiovascular status: blood pressure returned to baseline and stable Postop Assessment: no headache and no apparent nausea or vomiting Anesthetic complications: no Comments: Late entry   No complications documented.   Last Vitals:  Vitals:   06/18/20 1345 06/18/20 1400  BP: 111/77 104/90  Pulse: 78 89  Resp: 16 18  Temp:  36.6 C  SpO2: 94% 99%    Last Pain:  Vitals:   06/18/20 1400  TempSrc: Oral  PainSc: 4                  Windell Norfolk

## 2020-06-18 NOTE — Op Note (Signed)
Preoperative Diagnosis:  1.  Multiparous female desires permanent sterilization                                          2.  Elects to have bilateral salpingectomy for ovarian cancer prophylaxis  Postoperative Diagnosis:  Same as above  Procedure:  Laparoscopic Bilateral Salpingectomy for the purpose of permanent sterilization  Surgeon:  Rockne Coons MD  Anaesthesia: general  Findings:  Patient had normal pelvic anatomy and no intraperitoneal abnormalities.  Description of Operation:  Patient was taken to the OR and placed into supine position where she underwent general anaesthesia.   She was placed in the dorsal lithotomy position and prepped and draped in the usual sterile fashion.   An incision was made in the umbilicus and dissection taken down to the rectus fascia. A Veres needle was used to insufflate the periotneal cavity. An 11 mm non bladed video laparoscope trocar was then placed under direct visualization without difficulty.   The above noted findings were observed.   Two additional 5 mm non bladed trocars were placed in the right and left lower quadrants under direct visualization without difficulty.   The Harmonic scalpel was employed and a salpingectomy of both the right and left tubes was performed.   The tubes were removed from the peritoneal cavity and sent to pathology.   There was good hemostasis bilaterally.   The fascia, peritoneum and subcutaneous tissue were closed using 0 vicryl.   All 3 skin incisions were closed using 3-0 vicryl in a subcuticular fashion.  Exparel 266 mg 20 cc was injected in the 3 incisional/trocar sites.  The patient was awakened from anaesthesia and taken to the PACU with all counts being correct x 3.   The patient received  2 gram of ancef andToradol 30 mg IV preoperatively.  Lazaro Arms 06/18/2020 12:31 PM

## 2020-06-19 ENCOUNTER — Encounter (HOSPITAL_COMMUNITY): Payer: Self-pay | Admitting: Obstetrics & Gynecology

## 2020-06-20 LAB — SURGICAL PATHOLOGY

## 2020-06-30 ENCOUNTER — Other Ambulatory Visit: Payer: Self-pay

## 2020-06-30 ENCOUNTER — Telehealth (INDEPENDENT_AMBULATORY_CARE_PROVIDER_SITE_OTHER): Payer: Medicare Other | Admitting: Obstetrics & Gynecology

## 2020-06-30 ENCOUNTER — Encounter: Payer: Self-pay | Admitting: Obstetrics & Gynecology

## 2020-06-30 VITALS — Ht 63.0 in

## 2020-06-30 DIAGNOSIS — Z09 Encounter for follow-up examination after completed treatment for conditions other than malignant neoplasm: Secondary | ICD-10-CM

## 2020-06-30 DIAGNOSIS — Z9889 Other specified postprocedural states: Secondary | ICD-10-CM

## 2020-06-30 NOTE — Progress Notes (Signed)
Telephone call post op visit  Pt is at home I am in my office  5 minutes   HPI: Patient returns for routine postoperative follow-up having undergone laparoscopic builateral salpingectomy 06/18/20.  The patient's immediate postoperative recovery has been unremarkable. Since hospital discharge the patient reports no problems.   Current Outpatient Medications: albuterol (VENTOLIN HFA) 108 (90 Base) MCG/ACT inhaler, Inhale 2 puffs into the lungs every 6 (six) hours as needed for wheezing or shortness of breath., Disp: , Rfl:  amLODipine (NORVASC) 10 MG tablet, Take 1 tablet (10 mg total) by mouth daily., Disp: 30 tablet, Rfl: 2 diphenhydramine-acetaminophen (TYLENOL PM) 25-500 MG TABS tablet, Take 1 tablet by mouth at bedtime as needed., Disp: , Rfl:  etodolac (LODINE) 400 MG tablet, Take 1 tablet (400 mg total) by mouth 2 (two) times daily., Disp: 15 tablet, Rfl: 0 HYDROcodone-acetaminophen (NORCO/VICODIN) 5-325 MG tablet, Take 1 tablet by mouth every 6 (six) hours as needed., Disp: 10 tablet, Rfl: 0 ondansetron (ZOFRAN ODT) 8 MG disintegrating tablet, Take 1 tablet (8 mg total) by mouth every 8 (eight) hours as needed for nausea or vomiting., Disp: 8 tablet, Rfl: 0  No current facility-administered medications for this visit.    Height 5\' 3"  (1.6 m), last menstrual period 05/31/2020, not currently breastfeeding.  Physical Exam:   Diagnostic Tests:   Pathology: Benign tubes present  Impression: S/P laparoscopci bilateral salpingectomy for permanent sterilization.    Plan:     Follow up: No follow up needed   06/02/2020, MD

## 2020-10-12 NOTE — Progress Notes (Signed)
Chief Complaint  Patient presents with   Pre-op Exam      40 y.o. [id number] Patient's last menstrual period was 05/01/2020 (approximate). The current method of family planning is none.  Outpatient Encounter Medications as of 05/22/2020  Medication Sig   amLODipine (NORVASC) 10 MG tablet Take 1 tablet (10 mg total) by mouth daily.   diphenhydramine-acetaminophen (TYLENOL PM) 25-500 MG TABS tablet Take 1 tablet by mouth at bedtime as needed.   [DISCONTINUED] amLODipine (NORVASC) 5 MG tablet Take 5 mg by mouth daily.   [DISCONTINUED] hydrochlorothiazide (HYDRODIURIL) 25 MG tablet Take 1 tablet (25 mg total) by mouth daily. (Patient not taking: No sig reported)   [DISCONTINUED] labetalol (NORMODYNE) 200 MG tablet Take 2 tablets (400 mg total) by mouth 3 (three) times daily. (Patient not taking: No sig reported)   No facility-administered encounter medications on file as of 05/22/2020.    Subjective Pt is here to discuss BC options, desiring defintive surgical sterilization Discussed IUD, nexplanon, Depo provera COC, POC, nuva ring,patch All questions were answered  Also discussed electrocautery vs salpingectomy Past Medical History:  Diagnosis Date   Anemia    Heart murmur    Hypertension    Stress headaches    Tobacco abuse    1 PK/WEEK    Past Surgical History:  Procedure Laterality Date   BIOPSY  01/31/2018   Procedure: BIOPSY;  Surgeon: West Bali, MD;  [facility];  Service: Endoscopy;;  Gastric   CESAREAN SECTION  08/2007   CESAREAN SECTION N/A 08/28/2019   Procedure: CESAREAN SECTION;  Surgeon: Levie Heritage, DO;  Location: [facility];  Service: Obstetrics;  Laterality: N/A;   CHOLECYSTECTOMY, LAPAROSCOPIC  2006   ESOPHAGOGASTRODUODENOSCOPY (EGD) WITH PROPOFOL N/A 01/31/2018   Procedure: ESOPHAGOGASTRODUODENOSCOPY (EGD) WITH PROPOFOL;  Surgeon: West Bali, MD;  Location: [facility];  Service: Endoscopy;  Laterality: N/A;  7:30am    LAPAROSCOPIC BILATERAL SALPINGECTOMY Bilateral 06/18/2020   Procedure: LAPAROSCOPIC BILATERAL SALPINGECTOMY;  Surgeon: Lazaro Arms, MD;  Location: [facility];  Service: Gynecology;  Laterality: Bilateral;    OB History     Gravida  3   Para  2   Term  2   Preterm      AB  1   Living  2      SAB  1   IAB      Ectopic      Multiple  0   Live Births  2           No Known Allergies  Social History   Socioeconomic History   Marital status: Single    Spouse name: Not on file   Number of children: 2   Years of education: Not on file   Highest education level: High school graduate  Occupational History   Not on file  Tobacco Use   Smoking status: Every Day    Packs/day: 0.25    Types: Cigarettes   Smokeless tobacco: Never  Vaping Use   Vaping Use: Never used  Substance and Sexual Activity   Alcohol use: Not Currently    Comment: occasionally   Drug use: Not Currently   Sexual activity: Not Currently  Other Topics Concern   Not on file  Social History Narrative   DOESN'T WORK.ON SSI FOR LEARNING CHALLENGED. ONE DAUGHTER, SINGLE. LIVES WITH GRANDFATHER(AGE 49)   Social Determinants of Health   Financial Resource Strain: Not on file  Food Insecurity: Not  on file  Transportation Needs: Not on file  Physical Activity: Not on file  Stress: Not on file  Social Connections: Not on file    Family History  Problem Relation Age of Onset   Diabetes Brother    Diabetes Maternal Grandfather    Colon cancer Neg Hx    Colon polyps Neg Hx     Medications:       Current Outpatient Medications:    amLODipine (NORVASC) 10 MG tablet, Take 1 tablet (10 mg total) by mouth daily., Disp: 30 tablet, Rfl: 2   diphenhydramine-acetaminophen (TYLENOL PM) 25-500 MG TABS tablet, Take 1 tablet by mouth at bedtime as needed., Disp: , Rfl:    albuterol (VENTOLIN HFA) 108 (90 Base) MCG/ACT inhaler, Inhale 2 puffs into the lungs every 6 (six) hours as needed for wheezing or  shortness of breath., Disp: , Rfl:    etodolac (LODINE) 400 MG tablet, Take 1 tablet (400 mg total) by mouth 2 (two) times daily., Disp: 15 tablet, Rfl: 0   HYDROcodone-acetaminophen (NORCO/VICODIN) 5-325 MG tablet, Take 1 tablet by mouth every 6 (six) hours as needed., Disp: 10 tablet, Rfl: 0   ondansetron (ZOFRAN ODT) 8 MG disintegrating tablet, Take 1 tablet (8 mg total) by mouth every 8 (eight) hours as needed for nausea or vomiting., Disp: 8 tablet, Rfl: 0  Objective Blood pressure (!) 148/102, pulse 81, height 5\' 2"  (1.575 m), weight 218 lb (98.9 kg), last menstrual period 05/01/2020, unknown if currently breastfeeding.  Gen WDWN NAD  Pertinent ROS No burning with urination, frequency or urgency No nausea, vomiting or diarrhea Nor fever chills or other constitutional symptoms   Labs or studies     Impression Diagnoses this Encounter::   ICD-10-CM   1. Encounter for consultation for female sterilization  Z30.09       Established relevant diagnosis(es):   Plan/Recommendations: Meds ordered this encounter  Medications   amLODipine (NORVASC) 10 MG tablet    Sig: Take 1 tablet (10 mg total) by mouth daily.    Dispense:  30 tablet    Refill:  2    Labs or Scans Ordered: No orders of the defined types were placed in this encounter.   Management:: Will schedule bil salpingecomy for sterilization technique  Follow up Return in about 5 weeks (around 06/27/2020) for Post Op, MyChart Connect visit.      All questions were answered.

## 2020-11-16 ENCOUNTER — Emergency Department (HOSPITAL_COMMUNITY)
Admission: EM | Admit: 2020-11-16 | Discharge: 2020-11-16 | Disposition: A | Discharge status: Home / Self Care | Payer: Medicare Other | Attending: Emergency Medicine | Admitting: Emergency Medicine

## 2020-11-16 ENCOUNTER — Other Ambulatory Visit: Payer: Self-pay

## 2020-11-16 ENCOUNTER — Encounter (HOSPITAL_COMMUNITY): Payer: Self-pay

## 2020-11-16 DIAGNOSIS — Z3201 Encounter for pregnancy test, result positive: Secondary | ICD-10-CM | POA: Insufficient documentation

## 2020-11-16 DIAGNOSIS — I10 Essential (primary) hypertension: Secondary | ICD-10-CM | POA: Diagnosis not present

## 2020-11-16 DIAGNOSIS — F17210 Nicotine dependence, cigarettes, uncomplicated: Secondary | ICD-10-CM | POA: Insufficient documentation

## 2020-11-16 DIAGNOSIS — Z79899 Other long term (current) drug therapy: Secondary | ICD-10-CM | POA: Diagnosis not present

## 2020-11-16 LAB — PREGNANCY, URINE: Preg Test, Ur: POSITIVE — AB

## 2020-11-16 NOTE — ED Triage Notes (Addendum)
Pt here from home with cc of possible pregnancy. States that she had a tubal - tubes tied -- in April of this year, says that lately she had been feeling mild lower abdomen cramps and possible movement. She states that she believes her last period was in March.

## 2020-11-16 NOTE — ED Provider Notes (Signed)
[facility] EMERGENCY DEPARTMENT Provider Note   CSN: [id number] Arrival date & time: 11/16/20  1842     History Chief Complaint  Patient presents with   Possible Pregnancy    Angel Moore is a 40 y.o. female.   Possible Pregnancy Pertinent negatives include no chest pain, no abdominal pain and no shortness of breath.      Angel Moore is a 40 y.o. female who presents to the Emergency Department requesting pregnancy test.  States that she had a bilateral tubal ligation April of this year.  Last menstrual period was March.  Several days ago, she began feeling "movement" to her lower abdomen.  2 days ago, she states she felt a "kick" to her abdomen that felt similar to previous pregnancies.  She denies any significant abdominal pain or dysuria symptoms.  No vaginal bleeding or spotting.  2 previous pregnancies, last pregnancy 14 months ago and complicated with preeclampsia.    Past Medical History:  Diagnosis Date   Anemia    Heart murmur    Hypertension    Stress headaches    Tobacco abuse    1 PK/WEEK    Patient Active Problem List   Diagnosis Date Noted   Encounter for sterilization    Chronic hypertension with superimposed pre-eclampsia 08/28/2019   Abnormal chromosomal and genetic finding on antenatal screening mother 05/21/2019   Sickle cell trait (HCC) 03/26/2019   Supervision of high risk pregnancy, antepartum 03/20/2019   Smoker 03/20/2019   History of cesarean delivery 03/20/2019   Chronic hypertension affecting pregnancy    Dyspepsia 01/26/2018   Iron deficiency anemia due to chronic blood loss 01/26/2018   Constipation 01/26/2018    Past Surgical History:  Procedure Laterality Date   BIOPSY  01/31/2018   Procedure: BIOPSY;  Surgeon: West Bali, MD;  [facility];  Service: Endoscopy;;  Gastric   CESAREAN SECTION  08/2007   CESAREAN SECTION N/A 08/28/2019   Procedure: CESAREAN SECTION;  Surgeon: Levie Heritage, DO;  Location: [facility];  Service: Obstetrics;  Laterality: N/A;   CHOLECYSTECTOMY, LAPAROSCOPIC  2006   ESOPHAGOGASTRODUODENOSCOPY (EGD) WITH PROPOFOL N/A 01/31/2018   Procedure: ESOPHAGOGASTRODUODENOSCOPY (EGD) WITH PROPOFOL;  Surgeon: West Bali, MD;  [facility];  Service: Endoscopy;  Laterality: N/A;  7:30am   LAPAROSCOPIC BILATERAL SALPINGECTOMY Bilateral 06/18/2020   Procedure: LAPAROSCOPIC BILATERAL SALPINGECTOMY;  Surgeon: Lazaro Arms, MD;  Location: [facility];  Service: Gynecology;  Laterality: Bilateral;     OB History     Gravida  3   Para  2   Term  2   Preterm      AB  1   Living  2      SAB  1   IAB      Ectopic      Multiple  0   Live Births  2           Family History  Problem Relation Age of Onset   Diabetes Brother    Diabetes Maternal Grandfather    Colon cancer Neg Hx    Colon polyps Neg Hx     Social History   Tobacco Use   Smoking status: Every Day    Packs/day: 0.25    Types: Cigarettes   Smokeless tobacco: Never  Vaping Use   Vaping Use: Never used  Substance Use Topics   Alcohol use: Not Currently    Comment: occasionally   Drug use: Not  Currently    Home Medications Prior to Admission medications   Medication Sig Start Date End Date Taking? Authorizing Provider  albuterol (VENTOLIN HFA) 108 (90 Base) MCG/ACT inhaler Inhale 2 puffs into the lungs every 6 (six) hours as needed for wheezing or shortness of breath.    [provider]  amLODipine (NORVASC) 10 MG tablet Take 1 tablet (10 mg total) by mouth daily. 05/22/20   Lazaro Arms, MD  diphenhydramine-acetaminophen (TYLENOL PM) 25-500 MG TABS tablet Take 1 tablet by mouth at bedtime as needed.    [provider]  etodolac (LODINE) 400 MG tablet Take 1 tablet (400 mg total) by mouth 2 (two) times daily. 06/18/20   Lazaro Arms, MD  HYDROcodone-acetaminophen (NORCO/VICODIN) 5-325 MG tablet Take 1 tablet by mouth every 6 (six) hours as needed. 06/18/20   Lazaro Arms, MD  ondansetron (ZOFRAN ODT) 8 MG disintegrating tablet Take 1 tablet (8 mg total) by mouth every 8 (eight) hours as needed for nausea or vomiting. 06/18/20   Lazaro Arms, MD    Allergies    Patient has no known allergies.  Review of Systems   Review of Systems  Constitutional:  Negative for chills, fatigue and fever.  Respiratory:  Negative for shortness of breath.   Cardiovascular:  Negative for chest pain.  Gastrointestinal:  Negative for abdominal pain, nausea and vomiting.  Genitourinary:  Negative for dysuria, flank pain, hematuria, vaginal bleeding and vaginal discharge.  Musculoskeletal:  Negative for arthralgias, back pain and myalgias.  Skin:  Negative for rash.  Neurological:  Negative for dizziness, weakness and numbness.  Hematological:  Does not bruise/bleed easily.   Physical Exam Updated Vital Signs BP (!) 163/102 (BP Location: Right Arm)   Pulse 98   Temp 97.7 F (36.5 C)   Resp 19   Ht 5\' 3"  (1.6 m)   Wt 99.8 kg   SpO2 100%   BMI 38.97 kg/m   Physical Exam Vitals and nursing note reviewed.  Constitutional:      Appearance: Normal appearance. She is obese. She is not ill-appearing or toxic-appearing.  Cardiovascular:     Rate and Rhythm: Normal rate and regular rhythm.     Pulses: Normal pulses.  Pulmonary:     Effort: Pulmonary effort is normal.  Abdominal:     Palpations: Abdomen is soft.     Tenderness: There is no abdominal tenderness.     Comments: Abdominal exam limited to body habitus  Musculoskeletal:        General: Normal range of motion.     Right lower leg: No edema.     Left lower leg: No edema.  Skin:    General: Skin is warm.     Capillary Refill: Capillary refill takes less than 2 seconds.     Findings: No rash.  Neurological:     General: No focal deficit present.     Mental Status: She is alert.    ED Results / Procedures / Treatments   Labs (all labs ordered are listed, but only abnormal results are  displayed) Labs Reviewed  PREGNANCY, URINE - Abnormal; Notable for the following components:      Result Value   Preg Test, Ur POSITIVE (*)    All other components within normal limits    EKG None  Radiology No results found.  Procedures Procedures   Medications Ordered in ED Medications - No data to display  ED Course  I have reviewed the triage vital  signs and the nursing notes.  Pertinent labs & imaging results that were available during my care of the patient were reviewed by me and considered in my medical decision making (see chart for details).    MDM Rules/Calculators/A&P                           Patient here requesting evaluation of possible pregnancy.  Had tubal ligation in April 2022.  LMP March.  States she has been feeling "movement" to her lower abdomen.  Urine pregnancy test positive.  Bedside ultrasound performed by Dr. Bernette Mayers shows IUP with + cardiac activity  Pt appears stable to d/c home.  She will f/u with [facility]. Return precautions discussed.    Final Clinical Impression(s) / ED Diagnoses Final diagnoses:  Positive pregnancy test    Rx / DC Orders ED Discharge Orders     None        Rosey Bath 11/16/20 2320    Pollyann Savoy, MD 11/16/20 2325

## 2020-11-16 NOTE — Discharge Instructions (Addendum)
You had a positive pregnancy test here.  Bedside ultrasound confirms intrauterine pregnancy.  Please call Dr. Ursula Alert office on Tuesday to arrange a follow-up appointment.  Return to the emergency department for any new or worsening symptoms.  You may take Tylenol if needed for pain.  Do not take ibuprofen.  Avoid smoking, alcohol use and any illicit drug use.

## 2020-11-18 ENCOUNTER — Other Ambulatory Visit: Payer: Self-pay | Admitting: Obstetrics & Gynecology

## 2020-11-18 DIAGNOSIS — O3680X0 Pregnancy with inconclusive fetal viability, not applicable or unspecified: Secondary | ICD-10-CM

## 2020-11-19 ENCOUNTER — Other Ambulatory Visit: Payer: Self-pay | Admitting: Obstetrics & Gynecology

## 2020-11-19 ENCOUNTER — Ambulatory Visit (INDEPENDENT_AMBULATORY_CARE_PROVIDER_SITE_OTHER): Payer: Medicare Other

## 2020-11-19 ENCOUNTER — Other Ambulatory Visit: Payer: Self-pay

## 2020-11-19 DIAGNOSIS — Z363 Encounter for antenatal screening for malformations: Secondary | ICD-10-CM | POA: Diagnosis not present

## 2020-11-19 DIAGNOSIS — O3680X0 Pregnancy with inconclusive fetal viability, not applicable or unspecified: Secondary | ICD-10-CM | POA: Diagnosis not present

## 2020-11-19 DIAGNOSIS — Z3A24 24 weeks gestation of pregnancy: Secondary | ICD-10-CM | POA: Diagnosis not present

## 2020-11-19 NOTE — Progress Notes (Signed)
[place] 24+4 wks,breech,posterior fundal placenta gr 2,SVP of fluid 6.7 cm,normal ovaries,cx length 3.6 cm,fhr 135 bpm,LVEICF 1.9 mm,small stomach bubble,limited view of face,please have pt come back for additional images,EFW 804 g 76%,EDD 03/07/2021 by LMP

## 2020-12-01 ENCOUNTER — Other Ambulatory Visit (HOSPITAL_COMMUNITY)
Admission: RE | Admit: 2020-12-01 | Discharge: 2020-12-01 | Disposition: A | Discharge status: Home / Self Care | Payer: Medicare Other | Source: Ambulatory Visit | Attending: Women's Health | Admitting: Women's Health

## 2020-12-01 ENCOUNTER — Ambulatory Visit: Payer: Medicare Other | Admitting: *Deleted

## 2020-12-01 ENCOUNTER — Ambulatory Visit (INDEPENDENT_AMBULATORY_CARE_PROVIDER_SITE_OTHER): Payer: Medicare Other | Admitting: Women's Health

## 2020-12-01 ENCOUNTER — Encounter: Payer: Self-pay | Admitting: Women's Health

## 2020-12-01 ENCOUNTER — Other Ambulatory Visit: Payer: Self-pay

## 2020-12-01 VITALS — BP 143/95 | HR 100 | Wt 211.0 lb

## 2020-12-01 DIAGNOSIS — O0992 Supervision of high risk pregnancy, unspecified, second trimester: Secondary | ICD-10-CM

## 2020-12-01 DIAGNOSIS — Z124 Encounter for screening for malignant neoplasm of cervix: Secondary | ICD-10-CM | POA: Insufficient documentation

## 2020-12-01 DIAGNOSIS — Z1151 Encounter for screening for human papillomavirus (HPV): Secondary | ICD-10-CM | POA: Insufficient documentation

## 2020-12-01 DIAGNOSIS — O10919 Unspecified pre-existing hypertension complicating pregnancy, unspecified trimester: Secondary | ICD-10-CM

## 2020-12-01 DIAGNOSIS — Z113 Encounter for screening for infections with a predominantly sexual mode of transmission: Secondary | ICD-10-CM | POA: Diagnosis present

## 2020-12-01 DIAGNOSIS — Z302 Encounter for sterilization: Secondary | ICD-10-CM

## 2020-12-01 DIAGNOSIS — Z362 Encounter for other antenatal screening follow-up: Secondary | ICD-10-CM

## 2020-12-01 DIAGNOSIS — O0990 Supervision of high risk pregnancy, unspecified, unspecified trimester: Secondary | ICD-10-CM | POA: Insufficient documentation

## 2020-12-01 DIAGNOSIS — Z3A26 26 weeks gestation of pregnancy: Secondary | ICD-10-CM

## 2020-12-01 DIAGNOSIS — Z98891 History of uterine scar from previous surgery: Secondary | ICD-10-CM

## 2020-12-01 LAB — POCT URINALYSIS DIPSTICK OB
Blood, UA: NEGATIVE
Glucose, UA: NEGATIVE
Ketones, UA: NEGATIVE
Leukocytes, UA: NEGATIVE
Nitrite, UA: NEGATIVE
POC,PROTEIN,UA: NEGATIVE

## 2020-12-01 MED ORDER — PRENATAL PLUS 27-1 MG PO TABS: 1.0000 | ORAL_TABLET | Freq: Every day | ORAL | 3 refills | Status: AC

## 2020-12-01 MED ORDER — LABETALOL HCL 200 MG PO TABS
200.0000 mg | ORAL_TABLET | Freq: Two times a day (BID) | ORAL | 3 refills | Status: DC
Start: 2020-12-01 — End: 2021-03-01

## 2020-12-01 MED ORDER — ASPIRIN 81 MG PO TBEC: 162.0000 mg | DELAYED_RELEASE_TABLET | Freq: Every day | ORAL | 2 refills | Status: DC

## 2020-12-01 NOTE — Progress Notes (Signed)
INITIAL OBSTETRICAL VISIT Patient name: Angel Moore MRN [id number]  Date of birth: [birth year] Chief Complaint:   Initial Prenatal Visit  History of Present Illness:   Angel Moore is a 40 y.o. [phone] African-American female at [redacted]w[redacted]d by LMP c/w u/s at 24 weeks with an Estimated Date of Delivery: 03/07/21 being seen today for her initial obstetrical visit.   Patient's last menstrual period was 05/31/2020 (exact date). Her obstetrical history is significant for  SAB x 1, C/S x2, pre-e x 2. Had bilateral salpingectomy on 06/18/20. LMP 3/19- puts conception around 06/14/20, neg HCG 06/16/20 (so conceived prior to salpingectomy) .   CHTN- no meds currently. Not taking pnv. Today she reports no complaints.  Last pap 2019. Results were: negative per pt report at [facility]  Depression screen [facility] 2/9 12/01/2020 06/26/2019 03/20/2019  Decreased Interest 3 0 0  Down, Depressed, Hopeless 3 0 0  PHQ - 2 Score 6 0 0  Altered sleeping 0 0 0  Tired, decreased energy 3 0 1  Change in appetite 0 0 0  Feeling bad or failure about yourself  0 0 0  Trouble concentrating 0 0 0  Moving slowly or fidgety/restless 0 0 0  Suicidal thoughts 0 0 -  PHQ-9 Score 9 0 1     GAD 7 : Generalized Anxiety Score 12/01/2020 06/26/2019  Nervous, Anxious, on Edge 0 0  Control/stop worrying 0 0  Worry too much - different things 0 0  Trouble relaxing 0 0  Restless 0 0  Easily annoyed or irritable 0 0  Afraid - awful might happen 0 0  Total GAD 7 Score 0 0     Review of Systems:   Pertinent items are noted in HPI Denies cramping/contractions, leakage of fluid, vaginal bleeding, abnormal vaginal discharge w/ itching/odor/irritation, headaches, visual changes, shortness of breath, chest pain, abdominal pain, severe nausea/vomiting, or problems with urination or bowel movements unless otherwise stated above.  Pertinent History Reviewed:  Reviewed past medical,surgical, social, obstetrical and family history.  Reviewed  problem list, medications and allergies. OB History  Gravida Para Term Preterm AB Living  4 2 2   1 2   SAB IAB Ectopic Multiple Live Births  1     0 2    # Outcome Date GA Lbr Len/2nd Weight Sex Delivery Anes PTL Lv  4 Current           3 Term 08/28/19 [redacted]w[redacted]d  6 lb 5.6 oz (2.88 kg) F CS-LTranv Spinal  LIV  2 Term 08/23/07 [redacted]w[redacted]d  6 lb 12 oz (3.062 kg) F CS-LTranv EPI N LIV     Complications: Failure to Progress in First Stage, Pre-eclampsia  1 SAB            Physical Assessment:   Vitals:   12/01/20 1545  BP: (!) 143/95  Pulse: 100  Weight: 211 lb (95.7 kg)  Body mass index is 37.38 kg/m.       Physical Examination:  General appearance - well appearing, and in no distress  Mental status - alert, oriented to person, place, and time  Psych:  She has a normal mood and affect  Skin - warm and dry, normal color, no suspicious lesions noted  Chest - effort normal, all lung fields clear to auscultation bilaterally  Heart - normal rate and regular rhythm  Abdomen - soft, nontender  Extremities:  No swelling or varicosities noted  Pelvic - VULVA: normal appearing vulva with no masses, tenderness  or lesions  VAGINA: normal appearing vagina with normal color and discharge, no lesions  CERVIX: normal appearing cervix without discharge or lesions, no CMT  Thin prep pap is done w/ HR HPV cotesting  Chaperone: Federico Flake    Results for orders placed or performed in visit on 12/01/20 (from the past 24 hour(s))  POC Urinalysis Dipstick OB   Collection Time: 12/01/20  4:32 PM  Result Value Ref Range   Color, UA     Clarity, UA     Glucose, UA Negative Negative   Bilirubin, UA     Ketones, UA neg    Spec Grav, UA     Blood, UA neg    pH, UA     POC,PROTEIN,UA Negative Negative, Trace, Small (1+), Moderate (2+), Large (3+), 4+   Urobilinogen, UA     Nitrite, UA neg    Leukocytes, UA Negative Negative   Appearance     Odor      Assessment & Plan:  1) High-Risk Pregnancy [id number] at  [redacted]w[redacted]d with an Estimated Date of Delivery: 03/07/21   2) Initial OB visit  3) CHTN> no meds, rx labetalol 200mg  BID, start ASA 162mg , baseline labs today  4) Prev c/s x 2> for RCS  5) H/O pre-e x2  6) Previous salpingectomy> conceived ~4 days prior  Meds:  Meds ordered this encounter  Medications   aspirin 81 MG EC tablet    Sig: Take 2 tablets (162 mg total) by mouth daily. Swallow whole.    Dispense:  180 tablet    Refill:  2    Order Specific Question:   Supervising Provider    Answer:   H [[id number]]   labetalol (NORMODYNE) 200 MG tablet    Sig: Take 1 tablet (200 mg total) by mouth 2 (two) times daily.    Dispense:  60 tablet    Refill:  3    Order Specific Question:   Supervising Provider    Answer:   , LUTHER H [[id number]]   prenatal vitamin w/FE, FA (PRENATAL 1 + 1) 27-1 MG TABS tablet    Sig: Take 1 tablet by mouth daily at 12 noon.    Dispense:  90 tablet    Refill:  3    Order Specific Question:   Supervising Provider    Answer:   Duane Lope H [[id number]]    Initial labs obtained Continue prenatal vitamins Reviewed n/v relief measures and warning s/s to report Reviewed recommended weight gain based on pre-gravid BMI Encouraged well-balanced diet Genetic & carrier screening discussed: requests Panorama, declines Horizon , too late for nt/it, afp Ultrasound discussed; fetal survey: results reviewed CCNC completed> form faxed if has or is planning to apply for medicaid The nature of [facility] with multiple MDs and other Advanced Practice Providers was explained to patient; also emphasized that fellows, residents, and students are part of our team. Does have home bp cuff. Office bp cuff given: no. Rx sent: n/a. Check bp weekly, let Duane Lope know if consistently >140/90.   Follow-up: Return in about 2 weeks (around 12/15/2020) for HROB, PN2, US:OB F/U anatomy, in person, MD or CNM.   Orders Placed This Encounter  Procedures   Urine  Culture   US OB Follow Up   Protein / creatinine ratio, urine   Pain Management Screening Profile (10S)   Genetic Screening   Comprehensive metabolic panel   CBC/D/Plt+RPR+Rh+ABO+RubIgG...   POC Urinalysis Dipstick OB  Cheral Marker CNM, [facility] 12/01/2020 5:04 PM

## 2020-12-01 NOTE — Patient Instructions (Signed)
Angel Moore, thank you for choosing our office today! We appreciate the opportunity to meet your healthcare needs. You may receive a short survey by mail, e-mail, or through [employer]. If you are happy with your care we would appreciate if you could take just a few minutes to complete the survey questions. We read all of your comments and take your feedback very seriously. Thank you again for choosing our office.  Center for [employer] Team at [facility]  [facility] ([address] [place], [place] [address]) Entrance C, located off of [address] Free 24/7 valet parking   You will have your sugar test next visit.  Please do not eat or drink anything after midnight the night before you come, not even water.  You will be here for at least two hours.  Please make an appointment online for the bloodwork at [email or web address] for 8:00am (or as close to this as possible). Make sure you select the [facility] service center.   CLASSES: Go to [email or web address] to register for classes (childbirth, breastfeeding, waterbirth, infant CPR, daddy bootcamp, etc.)  Call the office [phone]) or go to [facility] if: You begin to have strong, frequent contractions Your water breaks.  Sometimes it is a big gush of fluid, sometimes it is just a trickle that keeps getting your panties wet or running down your legs You have vaginal bleeding.  It is normal to have a small amount of spotting if your cervix was checked.  You don't feel your baby moving like normal.  If you don't, get you something to eat and drink and lay down and focus on feeling your baby move.   If your baby is still not moving like normal, you should call the office or go to [facility].  Call the office [phone]) or go to [facility] for these signs of pre-eclampsia: Severe headache that does not go away with Tylenol Visual changes- seeing spots, double, blurred vision Pain under your right breast or  upper abdomen that does not go away with Tums or heartburn medicine Nausea and/or vomiting Severe swelling in your hands, feet, and face    [facility] Pediatricians/Family Doctors [facility]): [address] Colette Ribas, [phone]           [facility]: [address], [phone]                [facility]): [address], [phone] (call to ask if accepting patients) [facility] Department: [address], [place], [phone]    [facility] Pediatricians/Family Doctors [facility]): [address], [phone] [facility]: [address] [place], [phone] [facility]: [address], [phone]  [facility] Doctors  [facility]): [phone] [facility]: [address], [phone]   [facility] Doctors [facility]: [address] [phone]  [facility] Doctors  [facility]: [phone], [phone]  Home Blood Pressure Monitoring for Patients   Your provider has recommended that you check your blood pressure (BP) at least once a week at home. If you do not have a blood pressure cuff at home, one will be provided for you. Contact your provider if you have not received your monitor within 1 week.   Helpful Tips for Accurate Home Blood Pressure Checks  Don't smoke, exercise, or drink  caffeine 30 minutes before checking your BP Use the restroom before checking your BP (a full bladder can raise your pressure) Relax in a comfortable upright chair Feet on the ground Left arm resting comfortably on a flat surface at the level of your heart Legs uncrossed Back supported Sit quietly and don't talk Place the cuff on your bare arm Adjust snuggly, so that only two fingertips can fit between your skin and the top of the cuff Check 2  readings separated by at least one minute Keep a log of your BP readings For a visual, please reference this diagram: http://ccnc.care/bpdiagram  Provider Name: [facility]     Phone: [phone]  Zone 1: ALL CLEAR  Continue to monitor your symptoms:  BP reading is less than 140 (top number) or less than 90 (bottom number)  No right upper stomach pain No headaches or seeing spots No feeling nauseated or throwing up No swelling in face and hands  Zone 2: CAUTION Call your doctor's office for any of the following:  BP reading is greater than 140 (top number) or greater than 90 (bottom number)  Stomach pain under your ribs in the middle or right side Headaches or seeing spots Feeling nauseated or throwing up Swelling in face and hands  Zone 3: EMERGENCY  Seek immediate medical care if you have any of the following:  BP reading is greater than160 (top number) or greater than 110 (bottom number) Severe headaches not improving with Tylenol Serious difficulty catching your breath Any worsening symptoms from Zone 2   Second Trimester of Pregnancy The second trimester is from week 13 through week 28, months 4 through 6. The second trimester is often a time when you feel your best. Your body has also adjusted to being pregnant, and you begin to feel better physically. Usually, morning sickness has lessened or quit completely, you may have more energy, and you may have an increase in appetite. The second trimester is also a time when the fetus is growing rapidly. At the end of the sixth month, the fetus is about 9 inches long and weighs about 1 pounds. You will likely begin to feel the baby move (quickening) between 18 and 20 weeks of the pregnancy. BODY CHANGES Your body goes through many changes during pregnancy. The changes vary from woman to woman.  Your weight will continue to increase. You will notice your lower abdomen bulging out. You may begin to get stretch marks on your  hips, abdomen, and breasts. You may develop headaches that can be relieved by medicines approved by your health care provider. You may urinate more often because the fetus is pressing on your bladder. You may develop or continue to have heartburn as a result of your pregnancy. You may develop constipation because certain hormones are causing the muscles that push waste through your intestines to slow down. You may develop hemorrhoids or swollen, bulging veins (varicose veins). You may have back pain because of the weight gain and pregnancy hormones relaxing your joints between the bones in your pelvis and as a result of a shift in weight and the muscles that support your balance. Your breasts will continue to grow and be tender. Your gums may bleed and may be sensitive to brushing and flossing. Dark spots or blotches (chloasma, mask of pregnancy) may develop on your face. This will likely fade after the baby is born. A dark line from your belly button to the pubic area (linea nigra) may appear. This  will likely fade after the baby is born. You may have changes in your hair. These can include thickening of your hair, rapid growth, and changes in texture. Some women also have hair loss during or after pregnancy, or hair that feels dry or thin. Your hair will most likely return to normal after your baby is born. WHAT TO EXPECT AT YOUR PRENATAL VISITS During a routine prenatal visit: You will be weighed to make sure you and the fetus are growing normally. Your blood pressure will be taken. Your abdomen will be measured to track your baby's growth. The fetal heartbeat will be listened to. Any test results from the previous visit will be discussed. Your health care provider may ask you: How you are feeling. If you are feeling the baby move. If you have had any abnormal symptoms, such as leaking fluid, bleeding, severe headaches, or abdominal cramping. If you have any questions. Other tests that may  be performed during your second trimester include: Blood tests that check for: Low iron levels (anemia). Gestational diabetes (between 24 and 28 weeks). Rh antibodies. Urine tests to check for infections, diabetes, or protein in the urine. An ultrasound to confirm the proper growth and development of the baby. An amniocentesis to check for possible genetic problems. Fetal screens for spina bifida and Down syndrome. HOME CARE INSTRUCTIONS  Avoid all smoking, herbs, alcohol, and unprescribed drugs. These chemicals affect the formation and growth of the baby. Follow your health care provider's instructions regarding medicine use. There are medicines that are either safe or unsafe to take during pregnancy. Exercise only as directed by your health care provider. Experiencing uterine cramps is a good sign to stop exercising. Continue to eat regular, healthy meals. Wear a good support bra for breast tenderness. Do not use hot tubs, steam rooms, or saunas. Wear your seat belt at all times when driving. Avoid raw meat, uncooked cheese, cat litter boxes, and soil used by cats. These carry germs that can cause birth defects in the baby. Take your prenatal vitamins. Try taking a stool softener (if your health care provider approves) if you develop constipation. Eat more high-fiber foods, such as fresh vegetables or fruit and whole grains. Drink plenty of fluids to keep your urine clear or pale yellow. Take warm sitz baths to soothe any pain or discomfort caused by hemorrhoids. Use hemorrhoid cream if your health care provider approves. If you develop varicose veins, wear support hose. Elevate your feet for 15 minutes, 3-4 times a day. Limit salt in your diet. Avoid heavy lifting, wear low heel shoes, and practice good posture. Rest with your legs elevated if you have leg cramps or low back pain. Visit your dentist if you have not gone yet during your pregnancy. Use a soft toothbrush to brush your teeth  and be gentle when you floss. A sexual relationship may be continued unless your health care provider directs you otherwise. Continue to go to all your prenatal visits as directed by your health care provider. SEEK MEDICAL CARE IF:  You have dizziness. You have mild pelvic cramps, pelvic pressure, or nagging pain in the abdominal area. You have persistent nausea, vomiting, or diarrhea. You have a bad smelling vaginal discharge. You have pain with urination. SEEK IMMEDIATE MEDICAL CARE IF:  You have a fever. You are leaking fluid from your vagina. You have spotting or bleeding from your vagina. You have severe abdominal cramping or pain. You have rapid weight gain or loss. You have shortness of   breath with chest pain. You notice sudden or extreme swelling of your face, hands, ankles, feet, or legs. You have not felt your baby move in over an hour. You have severe headaches that do not go away with medicine. You have vision changes. Document Released: 02/23/2001 Document Revised: 03/06/2013 Document Reviewed: 05/02/2012 [facility] Patient Information 2015 [place], [place]. This information is not intended to replace advice given to you by your health care provider. Make sure you discuss any questions you have with your health care provider.

## 2020-12-03 LAB — PMP SCREEN PROFILE (10S), URINE
Amphetamine Scrn, Ur: NEGATIVE ng/mL
BARBITURATE SCREEN URINE: NEGATIVE ng/mL
BENZODIAZEPINE SCREEN, URINE: NEGATIVE ng/mL
CANNABINOIDS UR QL SCN: NEGATIVE ng/mL
Cocaine (Metab) Scrn, Ur: NEGATIVE ng/mL
Creatinine(Crt), U: 69.8 mg/dL (ref 20.0–300.0)
Methadone Screen, Urine: NEGATIVE ng/mL
OXYCODONE+OXYMORPHONE UR QL SCN: NEGATIVE ng/mL
Opiate Scrn, Ur: NEGATIVE ng/mL
Ph of Urine: 6.4 (ref 4.5–8.9)
Phencyclidine Qn, Ur: NEGATIVE ng/mL
Propoxyphene Scrn, Ur: NEGATIVE ng/mL

## 2020-12-03 LAB — CBC/D/PLT+RPR+RH+ABO+RUBIGG...
Antibody Screen: NEGATIVE
Basophils Absolute: 0.1 10*3/uL (ref 0.0–0.2)
Basos: 0 %
EOS (ABSOLUTE): 0.3 10*3/uL (ref 0.0–0.4)
Eos: 2 %
HCV Ab: <0.1 s/co ratio (ref 0.0–0.9)
HIV Screen 4th Generation wRfx: NONREACTIVE
Hematocrit: 28.1 % — ABNORMAL LOW (ref 34.0–46.6)
Hemoglobin: 8.6 g/dL — ABNORMAL LOW (ref 11.1–15.9)
Hepatitis B Surface Ag: NEGATIVE
Immature Grans (Abs): 0.1 10*3/uL (ref 0.0–0.1)
Immature Granulocytes: 1 %
Lymphocytes Absolute: 1.7 10*3/uL (ref 0.7–3.1)
Lymphs: 15 %
MCH: 20.9 pg — ABNORMAL LOW (ref 26.6–33.0)
MCHC: 30.6 g/dL — ABNORMAL LOW (ref 31.5–35.7)
MCV: 68 fL — ABNORMAL LOW (ref 79–97)
Monocytes Absolute: 1.2 10*3/uL — ABNORMAL HIGH (ref 0.1–0.9)
Monocytes: 11 %
Neutrophils Absolute: 8 10*3/uL — ABNORMAL HIGH (ref 1.4–7.0)
Neutrophils: 71 %
Platelets: 319 10*3/uL (ref 150–450)
RBC: 4.12 x10E6/uL (ref 3.77–5.28)
RDW: 18.7 % — ABNORMAL HIGH (ref 11.7–15.4)
RPR Ser Ql: NONREACTIVE
Rh Factor: POSITIVE
Rubella Antibodies, IGG: 1.83 index (ref >0.99)
WBC: 11.3 10*3/uL — ABNORMAL HIGH (ref 3.4–10.8)

## 2020-12-03 LAB — COMPREHENSIVE METABOLIC PANEL
ALT: 7 IU/L (ref 0–32)
AST: 15 IU/L (ref 0–40)
Albumin/Globulin Ratio: 1.2 (ref 1.2–2.2)
Albumin: 3.3 g/dL — ABNORMAL LOW (ref 3.8–4.8)
Alkaline Phosphatase: 128 IU/L — ABNORMAL HIGH (ref 44–121)
BUN/Creatinine Ratio: 9 (ref 9–23)
BUN: 5 mg/dL — ABNORMAL LOW (ref 6–20)
Bilirubin Total: 0.5 mg/dL (ref 0.0–1.2)
CO2: 19 mmol/L — ABNORMAL LOW (ref 20–29)
Calcium: 8.8 mg/dL (ref 8.7–10.2)
Chloride: 105 mmol/L (ref 96–106)
Creatinine, Ser: 0.54 mg/dL — ABNORMAL LOW (ref 0.57–1.00)
Globulin, Total: 2.8 g/dL (ref 1.5–4.5)
Glucose: 90 mg/dL (ref 65–99)
Potassium: 4.2 mmol/L (ref 3.5–5.2)
Sodium: 137 mmol/L (ref 134–144)
Total Protein: 6.1 g/dL (ref 6.0–8.5)
eGFR: 122 mL/min/{1.73_m2} (ref >59)

## 2020-12-03 LAB — PROTEIN / CREATININE RATIO, URINE
Creatinine, Urine: 57.3 mg/dL
Protein, Ur: 18.6 mg/dL
Protein/Creat Ratio: 325 mg/g creat — ABNORMAL HIGH (ref 0–200)

## 2020-12-03 LAB — HCV INTERPRETATION

## 2020-12-05 LAB — CYTOLOGY - PAP
Chlamydia: NEGATIVE
Comment: NEGATIVE
Comment: NEGATIVE
Comment: NORMAL
Diagnosis: NEGATIVE
High risk HPV: NEGATIVE
Neisseria Gonorrhea: NEGATIVE

## 2020-12-06 LAB — URINE CULTURE: Organism ID, Bacteria: NO GROWTH

## 2020-12-08 MED ORDER — FERROUS SULFATE 325 (65 FE) MG PO TABS: 325.0000 mg | ORAL_TABLET | ORAL | 2 refills | Status: DC

## 2020-12-08 NOTE — Addendum Note (Signed)
Addended by: Shawna Clamp R on: 12/08/2020 12:06 PM   Modules accepted: Orders

## 2020-12-16 NOTE — Addendum Note (Signed)
Addended by: Moss Mc on: 12/16/2020 04:42 PM   Modules accepted: Orders

## 2020-12-17 ENCOUNTER — Other Ambulatory Visit: Payer: Self-pay

## 2020-12-17 ENCOUNTER — Other Ambulatory Visit: Payer: Medicare Other

## 2020-12-17 ENCOUNTER — Encounter: Payer: Self-pay | Admitting: Women's Health

## 2020-12-17 DIAGNOSIS — O0993 Supervision of high risk pregnancy, unspecified, third trimester: Secondary | ICD-10-CM

## 2020-12-17 DIAGNOSIS — Z3A28 28 weeks gestation of pregnancy: Secondary | ICD-10-CM

## 2020-12-17 DIAGNOSIS — Z131 Encounter for screening for diabetes mellitus: Secondary | ICD-10-CM

## 2020-12-18 ENCOUNTER — Ambulatory Visit (INDEPENDENT_AMBULATORY_CARE_PROVIDER_SITE_OTHER): Payer: Medicare Other

## 2020-12-18 ENCOUNTER — Ambulatory Visit (INDEPENDENT_AMBULATORY_CARE_PROVIDER_SITE_OTHER): Payer: Medicare Other | Admitting: Obstetrics & Gynecology

## 2020-12-18 ENCOUNTER — Encounter: Payer: Self-pay | Admitting: Obstetrics & Gynecology

## 2020-12-18 VITALS — BP 134/91 | HR 80 | Wt 213.0 lb

## 2020-12-18 DIAGNOSIS — Z362 Encounter for other antenatal screening follow-up: Secondary | ICD-10-CM

## 2020-12-18 DIAGNOSIS — O10919 Unspecified pre-existing hypertension complicating pregnancy, unspecified trimester: Secondary | ICD-10-CM

## 2020-12-18 DIAGNOSIS — O0993 Supervision of high risk pregnancy, unspecified, third trimester: Secondary | ICD-10-CM

## 2020-12-18 DIAGNOSIS — O0992 Supervision of high risk pregnancy, unspecified, second trimester: Secondary | ICD-10-CM | POA: Diagnosis not present

## 2020-12-18 DIAGNOSIS — Z3A28 28 weeks gestation of pregnancy: Secondary | ICD-10-CM

## 2020-12-18 LAB — POCT URINALYSIS DIPSTICK OB
Glucose, UA: NEGATIVE
Ketones, UA: NEGATIVE
Nitrite, UA: NEGATIVE
POC,PROTEIN,UA: NEGATIVE

## 2020-12-18 LAB — GLUCOSE TOLERANCE, 2 HOURS W/ 1HR
Glucose, 1 hour: 197 mg/dL — ABNORMAL HIGH (ref 65–179)
Glucose, 2 hour: 97 mg/dL (ref 65–152)
Glucose, Fasting: 74 mg/dL (ref 65–91)

## 2020-12-18 NOTE — Progress Notes (Signed)
[place] 28+5 wks,cephalic,posterior placenta gr 3,AFI 13.2 cm,fhr 142 bpm,EFW 1413 g 68%,anatomy of the face complete,normal stomach bubble

## 2020-12-18 NOTE — Progress Notes (Signed)
HIGH-RISK PREGNANCY VISIT Patient name: Angel Moore MRN [id number]  Date of birth: 11-May-1980 Chief Complaint:   High Risk Gestation ([place] today)  History of Present Illness:   Angel Moore is a 40 y.o. [phone] female at [redacted]w[redacted]d with an Estimated Date of Delivery: 03/07/21 being seen today for ongoing management of a high-risk pregnancy complicated by [facility] A[facility].    Today she reports no complaints. Contractions: Not present. Vag. Bleeding: None.  Movement: Present. denies leaking of fluid.   Depression screen [facility] 2/9 12/01/2020 06/26/2019 03/20/2019  Decreased Interest 3 0 0  Down, Depressed, Hopeless 3 0 0  PHQ - 2 Score 6 0 0  Altered sleeping 0 0 0  Tired, decreased energy 3 0 1  Change in appetite 0 0 0  Feeling bad or failure about yourself  0 0 0  Trouble concentrating 0 0 0  Moving slowly or fidgety/restless 0 0 0  Suicidal thoughts 0 0 -  PHQ-9 Score 9 0 1     GAD 7 : Generalized Anxiety Score 12/01/2020 06/26/2019  Nervous, Anxious, on Edge 0 0  Control/stop worrying 0 0  Worry too much - different things 0 0  Trouble relaxing 0 0  Restless 0 0  Easily annoyed or irritable 0 0  Afraid - awful might happen 0 0  Total GAD 7 Score 0 0     Review of Systems:   Pertinent items are noted in HPI Denies abnormal vaginal discharge w/ itching/odor/irritation, headaches, visual changes, shortness of breath, chest pain, abdominal pain, severe nausea/vomiting, or problems with urination or bowel movements unless otherwise stated above. Pertinent History Reviewed:  Reviewed past medical,surgical, social, obstetrical and family history.  Reviewed problem list, medications and allergies. Physical Assessment:   Vitals:   12/18/20 1137  BP: (!) 134/91  Pulse: 80  Weight: 213 lb (96.6 kg)  Body mass index is 37.73 kg/m.           Physical Examination:   General appearance: alert, well appearing, and in no distress  Mental status: alert, oriented to person, place, and  time  Skin: warm & dry   Extremities: Edema: None    Cardiovascular: normal heart rate noted  Respiratory: normal respiratory effort, no distress  Abdomen: gravid, soft, non-tender  Pelvic: Cervical exam deferred         Fetal Status:     Movement: Present    Fetal Surveillance Testing today: Sonogram normal   Chaperone: N/A    Results for orders placed or performed in visit on 12/18/20 (from the past 24 hour(s))  POC Urinalysis Dipstick OB   Collection Time: 12/18/20 11:41 AM  Result Value Ref Range   Color, UA     Clarity, UA     Glucose, UA Negative Negative   Bilirubin, UA     Ketones, UA neg    Spec Grav, UA     Blood, UA trace    pH, UA     POC,PROTEIN,UA Negative Negative, Trace, Small (1+), Moderate (2+), Large (3+), 4+   Urobilinogen, UA     Nitrite, UA neg    Leukocytes, UA Trace (A) Negative   Appearance     Odor      Assessment & Plan:  High-risk pregnancy: [id number] at [redacted]w[redacted]d with an Estimated Date of Delivery: 03/07/21   1) CHTN on labetalol 200 BID, stable    Meds: No orders of the defined types were placed in this encounter.   Labs/procedures today: sonogram  Treatment Plan:  begin twice weekly testing at 32 weeks IOL 39 weeks or as clinically indicated  Reviewed: Preterm labor symptoms and general obstetric precautions including but not limited to vaginal bleeding, contractions, leaking of fluid and fetal movement were reviewed in detail with the patient.  All questions were answered. Does have home bp cuff. Office bp cuff given: not applicable. Check bp daily, let us know if consistently >150 and/or >95.  Follow-up: Return in about 3 weeks (around 01/08/2021) for HROB.   Future Appointments  Date Time Provider Department Center  01/08/2021 10:30 AM Myna Hidalgo, DO [facility]    Orders Placed This Encounter  Procedures   POC Urinalysis Dipstick OB   Angel Moore 12/18/2020 12:08 PM

## 2020-12-22 ENCOUNTER — Encounter: Payer: Self-pay | Admitting: Women's Health

## 2020-12-22 DIAGNOSIS — O24410 Gestational diabetes mellitus in pregnancy, diet controlled: Secondary | ICD-10-CM | POA: Insufficient documentation

## 2020-12-23 ENCOUNTER — Other Ambulatory Visit: Payer: Self-pay | Admitting: *Deleted

## 2020-12-23 DIAGNOSIS — O24419 Gestational diabetes mellitus in pregnancy, unspecified control: Secondary | ICD-10-CM

## 2020-12-23 MED ORDER — ACCU-CHEK GUIDE VI STRP: ORAL_STRIP | 12 refills | Status: DC

## 2020-12-23 MED ORDER — ACCU-CHEK GUIDE ME W/DEVICE KIT: 1.0000 | PACK | Freq: Four times a day (QID) | 0 refills | Status: DC

## 2020-12-23 MED ORDER — ACCU-CHEK SOFTCLIX LANCETS MISC: 12 refills | Status: DC

## 2021-01-08 ENCOUNTER — Encounter: Payer: Medicare Other | Admitting: Obstetrics & Gynecology

## 2021-01-15 ENCOUNTER — Other Ambulatory Visit: Payer: Self-pay

## 2021-01-15 ENCOUNTER — Encounter: Payer: Self-pay | Admitting: Advanced Practice Midwife

## 2021-01-15 ENCOUNTER — Ambulatory Visit (INDEPENDENT_AMBULATORY_CARE_PROVIDER_SITE_OTHER): Payer: Medicare Other | Admitting: Advanced Practice Midwife

## 2021-01-15 VITALS — BP 135/95 | HR 82 | Wt 217.0 lb

## 2021-01-15 DIAGNOSIS — O24419 Gestational diabetes mellitus in pregnancy, unspecified control: Secondary | ICD-10-CM

## 2021-01-15 DIAGNOSIS — O10919 Unspecified pre-existing hypertension complicating pregnancy, unspecified trimester: Secondary | ICD-10-CM

## 2021-01-15 DIAGNOSIS — O09893 Supervision of other high risk pregnancies, third trimester: Secondary | ICD-10-CM

## 2021-01-15 DIAGNOSIS — Z3A32 32 weeks gestation of pregnancy: Secondary | ICD-10-CM

## 2021-01-15 MED ORDER — FREESTYLE LIBRE 14 DAY READER DEVI: 3 refills | Status: DC

## 2021-01-15 NOTE — Progress Notes (Signed)
   HIGH-RISK PREGNANCY VISIT Patient name: Angel Moore MRN [id number]  Date of birth: [birth year] Chief Complaint:   Routine Prenatal Visit  History of Present Illness:   Angel Moore is a 40 y.o. [phone] female at [redacted]w[redacted]d with an Estimated Date of Delivery: 03/07/21 being seen today for ongoing management of a high-risk pregnancy complicated by [facility] currently on Labetalol 200mg  BID   Also, failed GTT, staff has been unable to contact pt.  BPs at homr are low 90's diastolic.  Today she reports no complaints. Contractions: Not present. Vag. Bleeding: None.  Movement: Present. denies leaking of fluid.  Review of Systems:   Pertinent items are noted in HPI Denies abnormal vaginal discharge w/ itching/odor/irritation, headaches, visual changes, shortness of breath, chest pain, abdominal pain, severe nausea/vomiting, or problems with urination or bowel movements unless otherwise stated above. Pertinent History Reviewed:  Reviewed past medical,surgical, social, obstetrical and family history.  Reviewed problem list, medications and allergies. Physical Assessment:   Vitals:   01/15/21 1029  BP: (!) 135/95  Pulse: 82  Weight: 217 lb (98.4 kg)  Body mass index is 38.44 kg/m.           Physical Examination:   General appearance: alert, well appearing, and in no distress  Mental status: alert, oriented to person, place, and time  Skin: warm & dry   Extremities: Edema: None    Cardiovascular: normal heart rate noted  Respiratory: normal respiratory effort, no distress  Abdomen: gravid, soft, non-tender  Pelvic: Cervical exam deferred         Fetal Status:     Movement: Present    Fetal Surveillance Testing today: doppler   No results found for this or any previous visit (from the past 24 hour(s)).  Assessment & Plan:  1) High-risk pregnancy 13/03/22 at [redacted]w[redacted]d with an Estimated Date of Delivery: 03/07/21   2) CHTN, stable Treatment plan  Meds: Labetalol 200mg  BID       Growth u/s q 4wks     2x/wk testing nst/sono @ 32wks     Deliver 38-39wks (37wks or prn if poor control)____   3) A1DM, will try to get nutritional appt in [place], as transportation is an issue.  Ordered 03/09/21, will see if insurance covers it.  Basic diabetic diet discussed  Treatment plan QID BS checks   Meds: No orders of the defined types were placed in this encounter.   Labs/procedures today: NST: FHR baseline 140 bpm, Variability: moderate, Accelerations:present, Decelerations:  Absent= Cat 1/Reactive    Reviewed: Preterm labor symptoms and general obstetric precautions including but not limited to vaginal bleeding, contractions, leaking of fluid and fetal movement were reviewed in detail with the patient.  All questions were answered..   Follow-up: Return for start twice weekly testing ( w/HROB visit; NST w/RN) next week (Mon/Th or Tue/Fri).  No future appointments.  Orders Placed This Encounter  Procedures  . Lauralyn Primes FETAL BPP WO NON STRESS  . US OB Follow Up  . [place] UA Cord Doppler  . POC Urinalysis Dipstick OB   US DNP, CNM 01/15/2021 10:58 AM

## 2021-01-15 NOTE — Patient Instructions (Addendum)
Angel Moore, I greatly value your feedback.  If you receive a survey following your visit with [place] today, we appreciate you taking the time to fill it out.  Thanks, Cathie Beams, CNM   [facility] HAS MOVED!!! It is now [facility] at [facility] ([address] [place], [place] [address]) Entrance located off of [address] Free 24/7 valet parking   Go to [email or web address] to register for FREE online childbirth classes    Call the office [phone]) or go to [facility] if: You begin to have strong, frequent contractions Your water breaks.  Sometimes it is a big gush of fluid, sometimes it is just a trickle that keeps getting your panties wet or running down your legs You have vaginal bleeding.  It is normal to have a small amount of spotting if your cervix was checked.  You don't feel your baby moving like normal.  If you don't, get you something to eat and drink and lay down and focus on feeling your baby move.  You should feel at least 10 movements in 2 hours.  If you don't, you should call the office or go to [facility].    Tdap Vaccine It is recommended that you get the Tdap vaccine during the third trimester of EACH pregnancy to help protect your baby from getting pertussis (whooping cough) 27-36 weeks is the BEST time to do this so that you can pass the protection on to your baby. During pregnancy is better than after pregnancy, but if you are unable to get it during pregnancy it will be offered at the hospital.  You will be offered this vaccine in the office after 27 weeks. If you do not have health insurance, you can get this vaccine at the health department or your family doctor Everyone who will be around your baby should also be up-to-date on their vaccines. Adults (who are not pregnant) only need 1 dose of Tdap during adulthood.   Third Trimester of Pregnancy The third trimester is from week 29 through week 42, months 7 through 9. The third  trimester is a time when the fetus is growing rapidly. At the end of the ninth month, the fetus is about 20 inches in length and weighs 6-10 pounds.  BODY CHANGES Your body goes through many changes during pregnancy. The changes vary from woman to woman.  Your weight will continue to increase. You can expect to gain 25-35 pounds (11-16 kg) by the end of the pregnancy. You may begin to get stretch marks on your hips, abdomen, and breasts. You may urinate more often because the fetus is moving lower into your pelvis and pressing on your bladder. You may develop or continue to have heartburn as a result of your pregnancy. You may develop constipation because certain hormones are causing the muscles that push waste through your intestines to slow down. You may develop hemorrhoids or swollen, bulging veins (varicose veins). You may have pelvic pain because of the weight gain and pregnancy hormones relaxing your joints between the bones in your pelvis. Backaches may result from overexertion of the muscles supporting your posture. You may have changes in your hair. These can include thickening of your hair, rapid growth, and changes in texture. Some women also have hair loss during or after pregnancy, or hair that feels dry or thin. Your hair will most likely return to normal after your baby is born. Your breasts will continue to grow and be tender. A  yellow discharge may leak from your breasts called colostrum. Your belly button may stick out. You may feel short of breath because of your expanding uterus. You may notice the fetus "dropping," or moving lower in your abdomen. You may have a bloody mucus discharge. This usually occurs a few days to a week before labor begins. Your cervix becomes thin and soft (effaced) near your due date. WHAT TO EXPECT AT YOUR PRENATAL EXAMS  You will have prenatal exams every 2 weeks until week 36. Then, you will have weekly prenatal exams. During a routine prenatal  visit: You will be weighed to make sure you and the fetus are growing normally. Your blood pressure is taken. Your abdomen will be measured to track your baby's growth. The fetal heartbeat will be listened to. Any test results from the previous visit will be discussed. You may have a cervical check near your due date to see if you have effaced. At around 36 weeks, your caregiver will check your cervix. At the same time, your caregiver will also perform a test on the secretions of the vaginal tissue. This test is to determine if a type of bacteria, Group B streptococcus, is present. Your caregiver will explain this further. Your caregiver may ask you: What your birth plan is. How you are feeling. If you are feeling the baby move. If you have had any abnormal symptoms, such as leaking fluid, bleeding, severe headaches, or abdominal cramping. If you have any questions. Other tests or screenings that may be performed during your third trimester include: Blood tests that check for low iron levels (anemia). Fetal testing to check the health, activity level, and growth of the fetus. Testing is done if you have certain medical conditions or if there are problems during the pregnancy. FALSE LABOR You may feel small, irregular contractions that eventually go away. These are called Braxton Hicks contractions, or false labor. Contractions may last for hours, days, or even weeks before true labor sets in. If contractions come at regular intervals, intensify, or become painful, it is best to be seen by your caregiver.  SIGNS OF LABOR  Menstrual-like cramps. Contractions that are 5 minutes apart or less. Contractions that start on the top of the uterus and spread down to the lower abdomen and back. A sense of increased pelvic pressure or back pain. A watery or bloody mucus discharge that comes from the vagina. If you have any of these signs before the 37th week of pregnancy, call your caregiver right away.  You need to go to the hospital to get checked immediately. HOME CARE INSTRUCTIONS  Avoid all smoking, herbs, alcohol, and unprescribed drugs. These chemicals affect the formation and growth of the baby. Follow your caregiver's instructions regarding medicine use. There are medicines that are either safe or unsafe to take during pregnancy. Exercise only as directed by your caregiver. Experiencing uterine cramps is a good sign to stop exercising. Continue to eat regular, healthy meals. Wear a good support bra for breast tenderness. Do not use hot tubs, steam rooms, or saunas. Wear your seat belt at all times when driving. Avoid raw meat, uncooked cheese, cat litter boxes, and soil used by cats. These carry germs that can cause birth defects in the baby. Take your prenatal vitamins. Try taking a stool softener (if your caregiver approves) if you develop constipation. Eat more high-fiber foods, such as fresh vegetables or fruit and whole grains. Drink plenty of fluids to keep your urine clear or pale yellow.  Take warm sitz baths to soothe any pain or discomfort caused by hemorrhoids. Use hemorrhoid cream if your caregiver approves. If you develop varicose veins, wear support hose. Elevate your feet for 15 minutes, 3-4 times a day. Limit salt in your diet. Avoid heavy lifting, wear low heal shoes, and practice good posture. Rest a lot with your legs elevated if you have leg cramps or low back pain. Visit your dentist if you have not gone during your pregnancy. Use a soft toothbrush to brush your teeth and be gentle when you floss. A sexual relationship may be continued unless your caregiver directs you otherwise. Do not travel far distances unless it is absolutely necessary and only with the approval of your caregiver. Take prenatal classes to understand, practice, and ask questions about the labor and delivery. Make a trial run to the hospital. Pack your hospital bag. Prepare the baby's  nursery. Continue to go to all your prenatal visits as directed by your caregiver. SEEK MEDICAL CARE IF: You are unsure if you are in labor or if your water has broken. You have dizziness. You have mild pelvic cramps, pelvic pressure, or nagging pain in your abdominal area. You have persistent nausea, vomiting, or diarrhea. You have a bad smelling vaginal discharge. You have pain with urination. SEEK IMMEDIATE MEDICAL CARE IF:  You have a fever. You are leaking fluid from your vagina. You have spotting or bleeding from your vagina. You have severe abdominal cramping or pain. You have rapid weight loss or gain. You have shortness of breath with chest pain. You notice sudden or extreme swelling of your face, hands, ankles, feet, or legs. You have not felt your baby move in over an hour. You have severe headaches that do not go away with medicine. You have vision changes. Document Released: 02/23/2001 Document Revised: 03/06/2013 Document Reviewed: 05/02/2012 [facility] Patient Information 2015 [place], [place]. This information is not intended to replace advice given to you by your health care provider. Make sure you discuss any questions you have with your health care provider.   Diabetes Mellitus and Nutrition, Adult When you have diabetes, or diabetes mellitus, it is very important to have healthy eating habits because your blood sugar (glucose) levels are greatly affected by what you eat and drink. Eating healthy foods in the right amounts, at about the same times every day, can help you: Control your blood glucose. Lower your risk of heart disease. Improve your blood pressure. Reach or maintain a healthy weight. What can affect my meal plan? Every person with diabetes is different, and each person has different needs for a meal plan. Your health care provider may recommend that you work with a dietitian to make a meal plan that is best for you. Your meal plan may vary depending on  factors such as: The calories you need. The medicines you take. Your weight. Your blood glucose, blood pressure, and cholesterol levels. Your activity level. Other health conditions you have, such as heart or kidney disease. How do carbohydrates affect me? Carbohydrates, also called carbs, affect your blood glucose level more than any other type of food. Eating carbs naturally raises the amount of glucose in your blood. Carb counting is a method for keeping track of how many carbs you eat. Counting carbs is important to keep your blood glucose at a healthy level, especially if you use insulin or take certain oral diabetes medicines. It is important to know how many carbs you can safely have in each meal.  This is different for every person. Your dietitian can help you calculate how many carbs you should have at each meal and for each snack. How does alcohol affect me? Alcohol can cause a sudden decrease in blood glucose (hypoglycemia), especially if you use insulin or take certain oral diabetes medicines. Hypoglycemia can be a life-threatening condition. Symptoms of hypoglycemia, such as sleepiness, dizziness, and confusion, are similar to symptoms of having too much alcohol. Do not drink alcohol if: Your health care provider tells you not to drink. You are pregnant, may be pregnant, or are planning to become pregnant. If you drink alcohol: Do not drink on an empty stomach. Limit how much you use to: 0-1 drink a day for women. 0-2 drinks a day for men. Be aware of how much alcohol is in your drink. In the [place] one drink equals one 12 oz bottle of beer (355 mL), one 5 oz glass of wine (148 mL), or one 1 oz glass of hard liquor (44 mL). Keep yourself hydrated with water, diet soda, or unsweetened iced tea. Keep in mind that regular soda, juice, and other mixers may contain a lot of sugar and must be counted as carbs. What are tips for following this plan? Reading food labels Start by checking  the serving size on the "Nutrition Facts" label of packaged foods and drinks. The amount of calories, carbs, fats, and other nutrients listed on the label is based on one serving of the item. Many items contain more than one serving per package. Check the total grams (g) of carbs in one serving. You can calculate the number of servings of carbs in one serving by dividing the total carbs by 15. For example, if a food has 30 g of total carbs per serving, it would be equal to 2 servings of carbs. Check the number of grams (g) of saturated fats and trans fats in one serving. Choose foods that have a low amount or none of these fats. Check the number of milligrams (mg) of salt (sodium) in one serving. Most people should limit total sodium intake to less than 2,300 mg per day. Always check the nutrition information of foods labeled as "low-fat" or "nonfat." These foods may be higher in added sugar or refined carbs and should be avoided. Talk to your dietitian to identify your daily goals for nutrients listed on the label. Shopping Avoid buying canned, pre-made, or processed foods. These foods tend to be high in fat, sodium, and added sugar. Shop around the outside edge of the grocery store. This is where you will most often find fresh fruits and vegetables, bulk grains, fresh meats, and fresh dairy. Cooking Use low-heat cooking methods, such as baking, instead of high-heat cooking methods like deep frying. Cook using healthy oils, such as olive, canola, or sunflower oil. Avoid cooking with butter, cream, or high-fat meats. Meal planning Eat meals and snacks regularly, preferably at the same times every day. Avoid going long periods of time without eating. Eat foods that are high in fiber, such as fresh fruits, vegetables, beans, and whole grains. Talk with your dietitian about how many servings of carbs you can eat at each meal. Eat 4-6 oz (112-168 g) of lean protein each day, such as lean meat, chicken,  fish, eggs, or tofu. One ounce (oz) of lean protein is equal to: 1 oz (28 g) of meat, chicken, or fish. 1 egg.  cup (62 g) of tofu. Eat some foods each day that contain healthy fats,  such as avocado, nuts, seeds, and fish. What foods should I eat? Fruits Berries. Apples. Oranges. Peaches. Apricots. Plums. Grapes. Mango. Papaya. Pomegranate. Kiwi. Cherries. Vegetables Lettuce. Spinach. Leafy greens, including kale, chard, collard greens, and mustard greens. Beets. Cauliflower. Cabbage. Broccoli. Carrots. Green beans. Tomatoes. Peppers. Onions. Cucumbers. Brussels sprouts. Grains Whole grains, such as whole-wheat or whole-grain bread, crackers, tortillas, cereal, and pasta. Unsweetened oatmeal. Quinoa. Brown or wild rice. Meats and other proteins Seafood. Poultry without skin. Lean cuts of poultry and beef. Tofu. Nuts. Seeds. Dairy Low-fat or fat-free dairy products such as milk, yogurt, and cheese. The items listed above may not be a complete list of foods and beverages you can eat. Contact a dietitian for more information. What foods should I avoid? Fruits Fruits canned with syrup. Vegetables Canned vegetables. Frozen vegetables with butter or cream sauce. Grains Refined white flour and flour products such as bread, pasta, snack foods, and cereals. Avoid all processed foods. Meats and other proteins Fatty cuts of meat. Poultry with skin. Breaded or fried meats. Processed meat. Avoid saturated fats. Dairy Full-fat yogurt, cheese, or milk. Beverages Sweetened drinks, such as soda or iced tea. The items listed above may not be a complete list of foods and beverages you should avoid. Contact a dietitian for more information. Questions to ask a health care provider Do I need to meet with a diabetes educator? Do I need to meet with a dietitian? What number can I call if I have questions? When are the best times to check my blood glucose? Where to find more information: American  Diabetes Association: diabetes.org Academy of Nutrition and Dietetics: www.eatright.[employer] of Diabetes and Digestive and Kidney Diseases: CarF[employer].tn Association of Diabetes Care and Education Specialists: www.diabeteseducator.org Summary It is important to have healthy eating habits because your blood sugar (glucose) levels are greatly affected by what you eat and drink. A healthy meal plan will help you control your blood glucose and maintain a healthy lifestyle. Your health care provider may recommend that you work with a dietitian to make a meal plan that is best for you. Keep in mind that carbohydrates (carbs) and alcohol have immediate effects on your blood glucose levels. It is important to count carbs and to use alcohol carefully. This information is not intended to replace advice given to you by your health care provider. Make sure you discuss any questions you have with your health care provider. Document Revised: 02/06/2019 Document Reviewed: 02/06/2019 Elsevier Patient Education  2021 [employer].

## 2021-01-19 ENCOUNTER — Ambulatory Visit (INDEPENDENT_AMBULATORY_CARE_PROVIDER_SITE_OTHER): Payer: Medicare Other

## 2021-01-19 ENCOUNTER — Encounter: Payer: Self-pay | Admitting: Obstetrics & Gynecology

## 2021-01-19 ENCOUNTER — Other Ambulatory Visit: Payer: Self-pay

## 2021-01-19 ENCOUNTER — Ambulatory Visit (INDEPENDENT_AMBULATORY_CARE_PROVIDER_SITE_OTHER): Payer: Medicare Other | Admitting: Obstetrics & Gynecology

## 2021-01-19 VITALS — BP 129/87 | HR 93 | Wt 218.0 lb

## 2021-01-19 DIAGNOSIS — O24419 Gestational diabetes mellitus in pregnancy, unspecified control: Secondary | ICD-10-CM | POA: Diagnosis not present

## 2021-01-19 DIAGNOSIS — O0993 Supervision of high risk pregnancy, unspecified, third trimester: Secondary | ICD-10-CM

## 2021-01-19 DIAGNOSIS — O09893 Supervision of other high risk pregnancies, third trimester: Secondary | ICD-10-CM | POA: Diagnosis not present

## 2021-01-19 DIAGNOSIS — O10919 Unspecified pre-existing hypertension complicating pregnancy, unspecified trimester: Secondary | ICD-10-CM

## 2021-01-19 DIAGNOSIS — Z3A33 33 weeks gestation of pregnancy: Secondary | ICD-10-CM | POA: Diagnosis not present

## 2021-01-19 DIAGNOSIS — Z98891 History of uterine scar from previous surgery: Secondary | ICD-10-CM

## 2021-01-19 NOTE — Progress Notes (Signed)
[place] 33+2 wks,cephalic,fhr 148 bpm,posterior placenta gr 3,AFI 16 cm,BPP 8/8,RI .56,.70,57=54%

## 2021-01-19 NOTE — Progress Notes (Signed)
HIGH-RISK PREGNANCY VISIT Patient name: Angel Moore MRN [id number]  Date of birth: 1980-12-05 Chief Complaint:   Routine Prenatal Visit  History of Present Illness:   LUCEIL DANSKY is a 40 y.o. [id number] female at [redacted]w[redacted]d with an Estimated Date of Delivery: 03/07/21 being seen today for ongoing management of a high-risk pregnancy complicated by [facility] on labetalol 200 BID, A1 DM.    Today she reports no complaints. Contractions: Not present. Vag. Bleeding: None.  Movement: Present. denies leaking of fluid.   Depression screen [facility] 2/9 12/01/2020 06/26/2019 03/20/2019  Decreased Interest 3 0 0  Down, Depressed, Hopeless 3 0 0  PHQ - 2 Score 6 0 0  Altered sleeping 0 0 0  Tired, decreased energy 3 0 1  Change in appetite 0 0 0  Feeling bad or failure about yourself  0 0 0  Trouble concentrating 0 0 0  Moving slowly or fidgety/restless 0 0 0  Suicidal thoughts 0 0 -  PHQ-9 Score 9 0 1     GAD 7 : Generalized Anxiety Score 12/01/2020 06/26/2019  Nervous, Anxious, on Edge 0 0  Control/stop worrying 0 0  Worry too much - different things 0 0  Trouble relaxing 0 0  Restless 0 0  Easily annoyed or irritable 0 0  Afraid - awful might happen 0 0  Total GAD 7 Score 0 0     Review of Systems:   Pertinent items are noted in HPI Denies abnormal vaginal discharge w/ itching/odor/irritation, headaches, visual changes, shortness of breath, chest pain, abdominal pain, severe nausea/vomiting, or problems with urination or bowel movements unless otherwise stated above. Pertinent History Reviewed:  Reviewed past medical,surgical, social, obstetrical and family history.  Reviewed problem list, medications and allergies. Physical Assessment:   Vitals:   01/19/21 1624  BP: 129/87  Pulse: 93  Weight: 218 lb (98.9 kg)  Body mass index is 38.62 kg/m.           Physical Examination:   General appearance: alert, well appearing, and in no distress  Mental status: alert, oriented to person, place,  and time  Skin: warm & dry   Extremities: Edema: None    Cardiovascular: normal heart rate noted  Respiratory: normal respiratory effort, no distress  Abdomen: gravid, soft, non-tender  Pelvic: Cervical exam deferred         Fetal Status:     Movement: Present    Fetal Surveillance Testing today: BPP 8/8 54% Dopplers   Chaperone: N/A    No results found for this or any previous visit (from the past 24 hour(s)).  Assessment & Plan:  High-risk pregnancy: [id number] at [redacted]w[redacted]d with an Estimated Date of Delivery: 03/07/21   1) CHTN on labetalol 200 BID,   2) A!DM, stable,   Meds: No orders of the defined types were placed in this encounter.   Labs/procedures today: U/S  Treatment Plan:  twice weekly surveillance, repeat section 03/02/21  Reviewed: Preterm labor symptoms and general obstetric precautions including but not limited to vaginal bleeding, contractions, leaking of fluid and fetal movement were reviewed in detail with the patient.  All questions were answered. Does have home bp cuff. Office bp cuff given: not applicable. Check bp daily, let us know if consistently >150 and/or >95.  Follow-up: No follow-ups on file.   Future Appointments  Date Time Provider [place]  01/22/2021  1:30 PM [facility] NURSE [facility]  01/26/2021 10:00 AM [facility] [place] [facility] None  01/26/2021  10:50 AM Cheral Marker, CNM [facility]  02/02/2021 11:30 AM [facility] [place] [facility] None  02/02/2021 12:15 PM Cheral Marker, CNM [facility]  02/16/2021 10:30 AM [facility] [place] [facility] None  02/16/2021 11:50 AM Cheral Marker, CNM [facility]  02/19/2021 10:10 AM [facility] NURSE [facility]  02/23/2021  9:30 AM [facility] [place] [facility] None  02/23/2021 10:30 AM Myna Hidalgo, DO [facility]    No orders of the defined types were placed in this encounter.  Lazaro Arms  01/19/2021 4:39 PM

## 2021-01-20 ENCOUNTER — Other Ambulatory Visit: Payer: Self-pay | Admitting: *Deleted

## 2021-01-20 DIAGNOSIS — O24419 Gestational diabetes mellitus in pregnancy, unspecified control: Secondary | ICD-10-CM

## 2021-01-20 MED ORDER — DEXCOM G6 SENSOR MISC: 1.0000 | 12 refills | Status: AC

## 2021-01-20 MED ORDER — DEXCOM G6 RECEIVER DEVI: 1.0000 | 0 refills | Status: DC

## 2021-01-20 MED ORDER — DEXCOM G6 TRANSMITTER MISC: 1.0000 | 3 refills | Status: DC

## 2021-01-22 ENCOUNTER — Other Ambulatory Visit: Payer: Medicare Other

## 2021-01-26 ENCOUNTER — Encounter: Payer: Self-pay | Admitting: Women's Health

## 2021-01-26 ENCOUNTER — Other Ambulatory Visit: Payer: Self-pay

## 2021-01-26 ENCOUNTER — Ambulatory Visit (INDEPENDENT_AMBULATORY_CARE_PROVIDER_SITE_OTHER): Payer: Medicare Other

## 2021-01-26 ENCOUNTER — Ambulatory Visit (INDEPENDENT_AMBULATORY_CARE_PROVIDER_SITE_OTHER): Payer: Medicare Other | Admitting: Women's Health

## 2021-01-26 VITALS — BP 139/101 | HR 102 | Wt 217.5 lb

## 2021-01-26 DIAGNOSIS — O10919 Unspecified pre-existing hypertension complicating pregnancy, unspecified trimester: Secondary | ICD-10-CM

## 2021-01-26 DIAGNOSIS — O24419 Gestational diabetes mellitus in pregnancy, unspecified control: Secondary | ICD-10-CM

## 2021-01-26 DIAGNOSIS — O0992 Supervision of high risk pregnancy, unspecified, second trimester: Secondary | ICD-10-CM

## 2021-01-26 DIAGNOSIS — Z3A34 34 weeks gestation of pregnancy: Secondary | ICD-10-CM | POA: Diagnosis not present

## 2021-01-26 DIAGNOSIS — O09893 Supervision of other high risk pregnancies, third trimester: Secondary | ICD-10-CM

## 2021-01-26 DIAGNOSIS — O0993 Supervision of high risk pregnancy, unspecified, third trimester: Secondary | ICD-10-CM

## 2021-01-26 LAB — POCT URINALYSIS DIPSTICK OB
Blood, UA: NEGATIVE
Glucose, UA: NEGATIVE
Ketones, UA: NEGATIVE
Nitrite, UA: NEGATIVE
POC,PROTEIN,UA: NEGATIVE

## 2021-01-26 NOTE — Patient Instructions (Signed)
Leenah, thank you for choosing our office today! We appreciate the opportunity to meet your healthcare needs. You may receive a short survey by mail, e-mail, or through [employer]. If you are happy with your care we would appreciate if you could take just a few minutes to complete the survey questions. We read all of your comments and take your feedback very seriously. Thank you again for choosing our office.  Center for [employer] Team at [facility]  [facility] ([address] [place], [place] [address]) Entrance C, located off of [address] Free 24/7 valet parking   CLASSES: Go to [email or web address] to register for classes (childbirth, breastfeeding, waterbirth, infant CPR, daddy bootcamp, etc.)  Call the office [phone]) or go to [facility] if: You begin to have strong, frequent contractions Your water breaks.  Sometimes it is a big gush of fluid, sometimes it is just a trickle that keeps getting your panties wet or running down your legs You have vaginal bleeding.  It is normal to have a small amount of spotting if your cervix was checked.  You don't feel your baby moving like normal.  If you don't, get you something to eat and drink and lay down and focus on feeling your baby move.   If your baby is still not moving like normal, you should call the office or go to [facility].  Call the office [phone]) or go to [facility] hospital for these signs of pre-eclampsia: Severe headache that does not go away with Tylenol Visual changes- seeing spots, double, blurred vision Pain under your right breast or upper abdomen that does not go away with Tums or heartburn medicine Nausea and/or vomiting Severe swelling in your hands, feet, and face   Tdap Vaccine It is recommended that you get the Tdap vaccine during the third trimester of EACH pregnancy to help protect your baby from getting pertussis (whooping cough) 27-36 weeks is the BEST time to do  this so that you can pass the protection on to your baby. During pregnancy is better than after pregnancy, but if you are unable to get it during pregnancy it will be offered at the hospital.  You can get this vaccine with [place], at the health department, your family doctor, or some local pharmacies Everyone who will be around your baby should also be up-to-date on their vaccines before the baby comes. Adults (who are not pregnant) only need 1 dose of Tdap during adulthood.   [facility] Pediatricians/Family Doctors  [facility]): [address] Colette Ribas, [phone]           [facility]: [address], [phone]                [facility]): [address], [phone] (call to ask if accepting patients) [facility]: [address], [place], [phone]    [facility] Pediatricians/Family Doctors [facility]): [phone] [address], [phone] [facility]: [address] [place], [phone] [facility]: [address], [phone]  [facility] Doctors  [facility]): [phone] [facility]: [address] [phone]   [facility] Doctors [facility]: [address], [phone]  [facility] Family Doctors  [facility]: [phone], [phone]  Home Blood Pressure Monitoring for Patients   Your provider has recommended that you check your  blood pressure (BP) at least once a week at home. If you do not have a blood pressure cuff at home, one will be provided for you. Contact your provider if you have not received your monitor within 1 week.   Helpful Tips for Accurate Home Blood Pressure Checks  Don't smoke, exercise, or drink caffeine 30 minutes before checking your BP Use the restroom before checking your BP (a full bladder can raise your  pressure) Relax in a comfortable upright chair Feet on the ground Left arm resting comfortably on a flat surface at the level of your heart Legs uncrossed Back supported Sit quietly and don't talk Place the cuff on your bare arm Adjust snuggly, so that only two fingertips can fit between your skin and the top of the cuff Check 2 readings separated by at least one minute Keep a log of your BP readings For a visual, please reference this diagram: http://ccnc.care/bpdiagram  Provider Name: [facility]     Phone: [phone]  Zone 1: ALL CLEAR  Continue to monitor your symptoms:  BP reading is less than 140 (top number) or less than 90 (bottom number)  No right upper stomach pain No headaches or seeing spots No feeling nauseated or throwing up No swelling in face and hands  Zone 2: CAUTION Call your doctor's office for any of the following:  BP reading is greater than 140 (top number) or greater than 90 (bottom number)  Stomach pain under your ribs in the middle or right side Headaches or seeing spots Feeling nauseated or throwing up Swelling in face and hands  Zone 3: EMERGENCY  Seek immediate medical care if you have any of the following:  BP reading is greater than160 (top number) or greater than 110 (bottom number) Severe headaches not improving with Tylenol Serious difficulty catching your breath Any worsening symptoms from Zone 2  Preterm Labor and Birth Information  The normal length of a pregnancy is 39-41 weeks. Preterm labor is when labor starts before 37 completed weeks of pregnancy. What are the risk factors for preterm labor? Preterm labor is more likely to occur in women who: Have certain infections during pregnancy such as a bladder infection, sexually transmitted infection, or infection inside the uterus (chorioamnionitis). Have a shorter-than-normal cervix. Have gone into preterm labor before. Have had surgery on their cervix. Are younger than age 17  or older than age 35. Are African American. Are pregnant with twins or multiple babies (multiple gestation). Take street drugs or smoke while pregnant. Do not gain enough weight while pregnant. Became pregnant shortly after having been pregnant. What are the symptoms of preterm labor? Symptoms of preterm labor include: Cramps similar to those that can happen during a menstrual period. The cramps may happen with diarrhea. Pain in the abdomen or lower back. Regular uterine contractions that may feel like tightening of the abdomen. A feeling of increased pressure in the pelvis. Increased watery or bloody mucus discharge from the vagina. Water breaking (ruptured amniotic sac). Why is it important to recognize signs of preterm labor? It is important to recognize signs of preterm labor because babies who are born prematurely may not be fully developed. This can put them at an increased risk for: Long-term (chronic) heart and lung problems. Difficulty immediately after birth with regulating body systems, including blood sugar, body temperature, heart rate, and breathing rate. Bleeding in the brain. Cerebral palsy. Learning difficulties. Death. These risks are highest for babies who are born before 34 weeks   of pregnancy. How is preterm labor treated? Treatment depends on the length of your pregnancy, your condition, and the health of your baby. It may involve: Having a stitch (suture) placed in your cervix to prevent your cervix from opening too early (cerclage). Taking or being given medicines, such as: Hormone medicines. These may be given early in pregnancy to help support the pregnancy. Medicine to stop contractions. Medicines to help mature the baby's lungs. These may be prescribed if the risk of delivery is high. Medicines to prevent your baby from developing cerebral palsy. If the labor happens before 34 weeks of pregnancy, you may need to stay in the hospital. What should I do if I  think I am in preterm labor? If you think that you are going into preterm labor, call your health care provider right away. How can I prevent preterm labor in future pregnancies? To increase your chance of having a full-term pregnancy: Do not use any tobacco products, such as cigarettes, chewing tobacco, and e-cigarettes. If you need help quitting, ask your health care provider. Do not use street drugs or medicines that have not been prescribed to you during your pregnancy. Talk with your health care provider before taking any herbal supplements, even if you have been taking them regularly. Make sure you gain a healthy amount of weight during your pregnancy. Watch for infection. If you think that you might have an infection, get it checked right away. Make sure to tell your health care provider if you have gone into preterm labor before. This information is not intended to replace advice given to you by your health care provider. Make sure you discuss any questions you have with your health care provider. Document Revised: 06/23/2018 Document Reviewed: 07/23/2015 Elsevier Patient Education  2020 Elsevier Inc.   

## 2021-01-26 NOTE — Progress Notes (Signed)
[place] 34+2 wks,cephalic,BPP 8/8,posterior fundal placenta gr 3,FHR 140 BPM,AFI 18.9 cm,RI .53,.65,.66=65%

## 2021-01-26 NOTE — Progress Notes (Signed)
HIGH-RISK PREGNANCY VISIT Patient name: Angel Moore MRN [id number]  Date of birth: [birth year] Chief Complaint:   High Risk Gestation ([place] today)  History of Present Illness:   Angel Moore is a 40 y.o. [phone] female at [redacted]w[redacted]d with an Estimated Date of Delivery: 03/07/21 being seen today for ongoing management of a high-risk pregnancy complicated by chronic hypertension currently supposed to be on labetalol 200mg  BID and diabetes mellitus A1DM.    Today she reports  not taking labetalol, pharmacy had issues getting it . Hasn't started checking sugars, pharmacy had issues getting glucometer/supplies. Feels she won't be able to prick her fingers, pharmacy was supposed to be checking on Dexcom. Has appt w/ dietician Wed. Has started making some dietary changes per her report.  Contractions: Not present. Vag. Bleeding: None.  Movement: Present. denies leaking of fluid.   Depression screen [facility] 2/9 12/01/2020 06/26/2019 03/20/2019  Decreased Interest 3 0 0  Down, Depressed, Hopeless 3 0 0  PHQ - 2 Score 6 0 0  Altered sleeping 0 0 0  Tired, decreased energy 3 0 1  Change in appetite 0 0 0  Feeling bad or failure about yourself  0 0 0  Trouble concentrating 0 0 0  Moving slowly or fidgety/restless 0 0 0  Suicidal thoughts 0 0 -  PHQ-9 Score 9 0 1     GAD 7 : Generalized Anxiety Score 12/01/2020 06/26/2019  Nervous, Anxious, on Edge 0 0  Control/stop worrying 0 0  Worry too much - different things 0 0  Trouble relaxing 0 0  Restless 0 0  Easily annoyed or irritable 0 0  Afraid - awful might happen 0 0  Total GAD 7 Score 0 0     Review of Systems:   Pertinent items are noted in HPI Denies abnormal vaginal discharge w/ itching/odor/irritation, headaches, visual changes, shortness of breath, chest pain, abdominal pain, severe nausea/vomiting, or problems with urination or bowel movements unless otherwise stated above. Pertinent History Reviewed:  Reviewed past medical,surgical, social,  obstetrical and family history.  Reviewed problem list, medications and allergies. Physical Assessment:   Vitals:   01/26/21 1108 01/26/21 1110  BP: (!) 146/100 (!) 139/101  Pulse: (!) 105 (!) 102  Weight: 217 lb 8 oz (98.7 kg)   Body mass index is 38.53 kg/m.           Physical Examination:   General appearance: alert, well appearing, and in no distress  Mental status: alert, oriented to person, place, and time  Skin: warm & dry   Extremities: Edema: None    Cardiovascular: normal heart rate noted  Respiratory: normal respiratory effort, no distress  Abdomen: gravid, soft, non-tender  Pelvic: Cervical exam deferred         Fetal Status: Fetal Heart Rate (bpm): 140 u/s   Movement: Present    Fetal Surveillance Testing today: 01/28/21 34+2 wks,cephalic,BPP 8/8,RI .53,.65,.66=65%,posterior fundal placenta gr 3,AFI 18.9 cm    Chaperone: N/A    Results for orders placed or performed in visit on 01/26/21 (from the past 24 hour(s))  POC Urinalysis Dipstick OB   Collection Time: 01/26/21 11:04 AM  Result Value Ref Range   Color, UA     Clarity, UA     Glucose, UA Negative Negative   Bilirubin, UA     Ketones, UA neg    Spec Grav, UA     Blood, UA neg    pH, UA     POC,PROTEIN,UA Negative Negative,  Trace, Small (1+), Moderate (2+), Large (3+), 4+   Urobilinogen, UA     Nitrite, UA neg    Leukocytes, UA Trace (A) Negative   Appearance     Odor      Assessment & Plan:  High-risk pregnancy: [id number] at [redacted]w[redacted]d with an Estimated Date of Delivery: 03/07/21   1) CHTN w/ h/o pre-e> supposed to be on Labetalol 200mg  BID, not taking meds d/t issue w/ pharmacy, plans to go straight to pharmacy when leaves here to get straight. Told her to have them call us if needed. Asymptomatic, no proteinuria. Reviewed pre-e s/s, reasons to seek care. Check home bp's BID, call us/go to [facility] if >160/110  2) GDM, hasn't started check sugars, issue w/ pharmacy getting supplies- again going there right now.  To have them call us if any issues. States they were checking on Dexcom for her b/c she doesn't think she can prick her fingers. If unable to get Dexcom to have her 40yo check them for her (or her mother). Bring log to appts. Keep appt w/ dietician on Wed. Discussed importance of knowing what blood sugars are doing/and potential adverse implications if we don't know.   3) Prev c/s>for RCS 12/18   Meds: No orders of the defined types were placed in this encounter.   Labs/procedures today: U/S  Treatment Plan:  2x/wk testing bpp/dopp alt w/ NST  Reviewed: Preterm labor symptoms and general obstetric precautions including but not limited to vaginal bleeding, contractions, leaking of fluid and fetal movement were reviewed in detail with the patient.  All questions were answered. Does have home bp cuff. Office bp cuff given: not applicable. Check bp twice daily, let us know if consistently >160 and/or >110.  Follow-up: Return for add Thurs NST/nurse visits.   Future Appointments  Date Time Provider [place]  01/28/2021  9:00 AM NDM-[facility] GDM CLASS [place] NDM  01/29/2021  2:30 PM [facility] NURSE [facility]  02/02/2021 11:30 AM [facility] - [facility] [place] C[facility]-FT[facility]G None  02/02/2021 12:15 PM Roma Schanz, CNM [facility]  02/16/2021 10:30 AM [facility] - [facility] [place] [facility] None  02/16/2021 11:50 AM Roma Schanz, CNM [facility]  02/19/2021 10:10 AM [facility] NURSE [facility]  02/23/2021  9:30 AM [facility] - [facility] [place] [facility]-FT[facility] None  02/23/2021 10:30 AM Janyth Pupa, DO [facility]-FT [facility]    Orders Placed This Encounter  Procedures   POC Urinalysis Dipstick OB   Roma Schanz CNM, [facility] 01/26/2021 11:39 AM

## 2021-01-27 ENCOUNTER — Other Ambulatory Visit: Payer: Self-pay | Admitting: *Deleted

## 2021-01-27 DIAGNOSIS — O24419 Gestational diabetes mellitus in pregnancy, unspecified control: Secondary | ICD-10-CM

## 2021-01-27 MED ORDER — DEXCOM G6 RECEIVER DEVI: 1.0000 | 0 refills | Status: DC

## 2021-01-27 MED ORDER — DEXCOM G6 TRANSMITTER MISC: 1.0000 | 3 refills | Status: AC

## 2021-01-28 ENCOUNTER — Encounter: Payer: Medicare Other | Attending: Women's Health | Admitting: Registered"

## 2021-01-29 ENCOUNTER — Ambulatory Visit (INDEPENDENT_AMBULATORY_CARE_PROVIDER_SITE_OTHER): Payer: Medicare Other | Admitting: *Deleted

## 2021-01-29 ENCOUNTER — Other Ambulatory Visit: Payer: Self-pay

## 2021-01-29 ENCOUNTER — Telehealth: Payer: Self-pay | Admitting: *Deleted

## 2021-01-29 DIAGNOSIS — O0993 Supervision of high risk pregnancy, unspecified, third trimester: Secondary | ICD-10-CM

## 2021-01-29 DIAGNOSIS — O24410 Gestational diabetes mellitus in pregnancy, diet controlled: Secondary | ICD-10-CM | POA: Diagnosis not present

## 2021-01-29 DIAGNOSIS — Z3A34 34 weeks gestation of pregnancy: Secondary | ICD-10-CM | POA: Diagnosis not present

## 2021-01-29 DIAGNOSIS — O10919 Unspecified pre-existing hypertension complicating pregnancy, unspecified trimester: Secondary | ICD-10-CM

## 2021-01-29 NOTE — Progress Notes (Addendum)
   NURSE VISIT- NST  SUBJECTIVE:  Angel Moore is a 40 y.o. [phone] female at [redacted]w[redacted]d, here for a NST for pregnancy complicated by [facility] and A1DM.  She reports active fetal movement, contractions: none, vaginal bleeding: none, membranes: intact.   OBJECTIVE:  BP (!) 145/83   Pulse 97   Wt 222 lb (100.7 kg)   LMP 05/31/2020 (Exact Date)   BMI 39.33 kg/m   Appears well, no apparent distress  Pt unable to void  NST: FHR baseline 135 bpm, Variability: moderate, Accelerations:present, Decelerations:  Absent= Cat 1/reactive Toco: none   ASSESSMENT: [id number] at [redacted]w[redacted]d with CHTN and A1DM NST reactive  PLAN: EFM strip reviewed by Dr. Despina Hidden   Recommendations: keep next appointment as scheduled    Jobe Marker  01/29/2021 4:29 PM   Attestation of Attending Supervision of Nursing Visit Encounter: Evaluation and management procedures were performed by the nursing staff under my supervision and collaboration.  I have reviewed the nurse's note and chart, and I agree with the management and plan.  Rockne Coons MD Attending Physician for the Center for [facility] 02/01/2021 1:16 PM

## 2021-01-29 NOTE — Telephone Encounter (Signed)
Dexcom transmitter and sensor sent to [employer]

## 2021-02-02 ENCOUNTER — Other Ambulatory Visit: Payer: Self-pay

## 2021-02-02 ENCOUNTER — Ambulatory Visit (INDEPENDENT_AMBULATORY_CARE_PROVIDER_SITE_OTHER): Payer: Medicare Other

## 2021-02-02 ENCOUNTER — Encounter: Payer: Self-pay | Admitting: Women's Health

## 2021-02-02 ENCOUNTER — Ambulatory Visit (INDEPENDENT_AMBULATORY_CARE_PROVIDER_SITE_OTHER): Payer: Medicare Other | Admitting: Women's Health

## 2021-02-02 VITALS — BP 125/87 | HR 91 | Wt 224.0 lb

## 2021-02-02 DIAGNOSIS — O10919 Unspecified pre-existing hypertension complicating pregnancy, unspecified trimester: Secondary | ICD-10-CM

## 2021-02-02 DIAGNOSIS — O24410 Gestational diabetes mellitus in pregnancy, diet controlled: Secondary | ICD-10-CM

## 2021-02-02 DIAGNOSIS — O0993 Supervision of high risk pregnancy, unspecified, third trimester: Secondary | ICD-10-CM

## 2021-02-02 DIAGNOSIS — O09893 Supervision of other high risk pregnancies, third trimester: Secondary | ICD-10-CM | POA: Diagnosis not present

## 2021-02-02 DIAGNOSIS — O24419 Gestational diabetes mellitus in pregnancy, unspecified control: Secondary | ICD-10-CM

## 2021-02-02 NOTE — Progress Notes (Signed)
[place] 35+2 wks,cephalic,BPP 8/8,FHR 152 BPM,posterior placenta gr 3,AFI 18.3 cm,RI .48,.65,.62=53%

## 2021-02-02 NOTE — Patient Instructions (Signed)
Angel Moore, thank you for choosing our office today! We appreciate the opportunity to meet your healthcare needs. You may receive a short survey by mail, e-mail, or through [employer]. If you are happy with your care we would appreciate if you could take just a few minutes to complete the survey questions. We read all of your comments and take your feedback very seriously. Thank you again for choosing our office.  Center for [employer] Team at [facility]  [facility] ([address] [place], [place] [address]) Entrance C, located off of [address] Free 24/7 valet parking   CLASSES: Go to [email or web address] to register for classes (childbirth, breastfeeding, waterbirth, infant CPR, daddy bootcamp, etc.)  Call the office [phone]) or go to [facility] if: You begin to have strong, frequent contractions Your water breaks.  Sometimes it is a big gush of fluid, sometimes it is just a trickle that keeps getting your panties wet or running down your legs You have vaginal bleeding.  It is normal to have a small amount of spotting if your cervix was checked.  You don't feel your baby moving like normal.  If you don't, get you something to eat and drink and lay down and focus on feeling your baby move.   If your baby is still not moving like normal, you should call the office or go to [facility].  Call the office [phone]) or go to [facility] hospital for these signs of pre-eclampsia: Severe headache that does not go away with Tylenol Visual changes- seeing spots, double, blurred vision Pain under your right breast or upper abdomen that does not go away with Tums or heartburn medicine Nausea and/or vomiting Severe swelling in your hands, feet, and face   Tdap Vaccine It is recommended that you get the Tdap vaccine during the third trimester of EACH pregnancy to help protect your baby from getting pertussis (whooping cough) 27-36 weeks is the BEST time to do  this so that you can pass the protection on to your baby. During pregnancy is better than after pregnancy, but if you are unable to get it during pregnancy it will be offered at the hospital.  You can get this vaccine with [place], at the health department, your family doctor, or some local pharmacies Everyone who will be around your baby should also be up-to-date on their vaccines before the baby comes. Adults (who are not pregnant) only need 1 dose of Tdap during adulthood.   [facility] Pediatricians/Family Doctors  [facility]): [address] Colette Ribas, [phone]           [facility]: [address], [phone]                [facility]): [address], [phone] (call to ask if accepting patients) [facility]: [address], [place], [phone]    [facility] Pediatricians/Family Doctors [facility]): [phone] [address], [phone] [facility]: [address] [place], [phone] [facility]: [address], [phone]  [facility] Doctors  [facility]): [phone] [facility]: [address] [phone]   [facility] Doctors [facility]: [address], [phone]  [facility] Family Doctors  [facility]: [phone], [phone]  Home Blood Pressure Monitoring for Patients   Your provider has recommended that you check your  blood pressure (BP) at least once a week at home. If you do not have a blood pressure cuff at home, one will be provided for you. Contact your provider if you have not received your monitor within 1 week.   Helpful Tips for Accurate Home Blood Pressure Checks  Don't smoke, exercise, or drink caffeine 30 minutes before checking your BP Use the restroom before checking your BP (a full bladder can raise your  pressure) Relax in a comfortable upright chair Feet on the ground Left arm resting comfortably on a flat surface at the level of your heart Legs uncrossed Back supported Sit quietly and don't talk Place the cuff on your bare arm Adjust snuggly, so that only two fingertips can fit between your skin and the top of the cuff Check 2 readings separated by at least one minute Keep a log of your BP readings For a visual, please reference this diagram: http://ccnc.care/bpdiagram  Provider Name: [facility]     Phone: [phone]  Zone 1: ALL CLEAR  Continue to monitor your symptoms:  BP reading is less than 140 (top number) or less than 90 (bottom number)  No right upper stomach pain No headaches or seeing spots No feeling nauseated or throwing up No swelling in face and hands  Zone 2: CAUTION Call your doctor's office for any of the following:  BP reading is greater than 140 (top number) or greater than 90 (bottom number)  Stomach pain under your ribs in the middle or right side Headaches or seeing spots Feeling nauseated or throwing up Swelling in face and hands  Zone 3: EMERGENCY  Seek immediate medical care if you have any of the following:  BP reading is greater than160 (top number) or greater than 110 (bottom number) Severe headaches not improving with Tylenol Serious difficulty catching your breath Any worsening symptoms from Zone 2  Preterm Labor and Birth Information  The normal length of a pregnancy is 39-41 weeks. Preterm labor is when labor starts before 37 completed weeks of pregnancy. What are the risk factors for preterm labor? Preterm labor is more likely to occur in women who: Have certain infections during pregnancy such as a bladder infection, sexually transmitted infection, or infection inside the uterus (chorioamnionitis). Have a shorter-than-normal cervix. Have gone into preterm labor before. Have had surgery on their cervix. Are younger than age 17  or older than age 35. Are African American. Are pregnant with twins or multiple babies (multiple gestation). Take street drugs or smoke while pregnant. Do not gain enough weight while pregnant. Became pregnant shortly after having been pregnant. What are the symptoms of preterm labor? Symptoms of preterm labor include: Cramps similar to those that can happen during a menstrual period. The cramps may happen with diarrhea. Pain in the abdomen or lower back. Regular uterine contractions that may feel like tightening of the abdomen. A feeling of increased pressure in the pelvis. Increased watery or bloody mucus discharge from the vagina. Water breaking (ruptured amniotic sac). Why is it important to recognize signs of preterm labor? It is important to recognize signs of preterm labor because babies who are born prematurely may not be fully developed. This can put them at an increased risk for: Long-term (chronic) heart and lung problems. Difficulty immediately after birth with regulating body systems, including blood sugar, body temperature, heart rate, and breathing rate. Bleeding in the brain. Cerebral palsy. Learning difficulties. Death. These risks are highest for babies who are born before 34 weeks   of pregnancy. How is preterm labor treated? Treatment depends on the length of your pregnancy, your condition, and the health of your baby. It may involve: Having a stitch (suture) placed in your cervix to prevent your cervix from opening too early (cerclage). Taking or being given medicines, such as: Hormone medicines. These may be given early in pregnancy to help support the pregnancy. Medicine to stop contractions. Medicines to help mature the baby's lungs. These may be prescribed if the risk of delivery is high. Medicines to prevent your baby from developing cerebral palsy. If the labor happens before 34 weeks of pregnancy, you may need to stay in the hospital. What should I do if I  think I am in preterm labor? If you think that you are going into preterm labor, call your health care provider right away. How can I prevent preterm labor in future pregnancies? To increase your chance of having a full-term pregnancy: Do not use any tobacco products, such as cigarettes, chewing tobacco, and e-cigarettes. If you need help quitting, ask your health care provider. Do not use street drugs or medicines that have not been prescribed to you during your pregnancy. Talk with your health care provider before taking any herbal supplements, even if you have been taking them regularly. Make sure you gain a healthy amount of weight during your pregnancy. Watch for infection. If you think that you might have an infection, get it checked right away. Make sure to tell your health care provider if you have gone into preterm labor before. This information is not intended to replace advice given to you by your health care provider. Make sure you discuss any questions you have with your health care provider. Document Revised: 06/23/2018 Document Reviewed: 07/23/2015 Elsevier Patient Education  2020 Elsevier Inc.   

## 2021-02-02 NOTE — Progress Notes (Signed)
HIGH-RISK PREGNANCY VISIT Patient name: Angel Moore MRN [id number]  Date of birth: 1980/08/13 Chief Complaint:   Routine Prenatal Visit ([place] today)  History of Present Illness:   Angel Moore is a 40 y.o. [phone] female at [redacted]w[redacted]d with an Estimated Date of Delivery: 03/07/21 being seen today for ongoing management of a high-risk pregnancy complicated by [facility] currently on Labetalol 200mg  BID and diabetes mellitus A1DM.    Today she reports  forgot log, all sugars wnl. Taking labetalol . Contractions: Not present. Vag. Bleeding: None.  Movement: Present. denies leaking of fluid.   Depression screen [facility] 2/9 12/01/2020 06/26/2019 03/20/2019  Decreased Interest 3 0 0  Down, Depressed, Hopeless 3 0 0  PHQ - 2 Score 6 0 0  Altered sleeping 0 0 0  Tired, decreased energy 3 0 1  Change in appetite 0 0 0  Feeling bad or failure about yourself  0 0 0  Trouble concentrating 0 0 0  Moving slowly or fidgety/restless 0 0 0  Suicidal thoughts 0 0 -  PHQ-9 Score 9 0 1     GAD 7 : Generalized Anxiety Score 12/01/2020 06/26/2019  Nervous, Anxious, on Edge 0 0  Control/stop worrying 0 0  Worry too much - different things 0 0  Trouble relaxing 0 0  Restless 0 0  Easily annoyed or irritable 0 0  Afraid - awful might happen 0 0  Total GAD 7 Score 0 0     Review of Systems:   Pertinent items are noted in HPI Denies abnormal vaginal discharge w/ itching/odor/irritation, headaches, visual changes, shortness of breath, chest pain, abdominal pain, severe nausea/vomiting, or problems with urination or bowel movements unless otherwise stated above. Pertinent History Reviewed:  Reviewed past medical,surgical, social, obstetrical and family history.  Reviewed problem list, medications and allergies. Physical Assessment:   Vitals:   02/02/21 1129  BP: 125/87  Pulse: 91  Weight: 224 lb (101.6 kg)  Body mass index is 39.68 kg/m.           Physical Examination:   General appearance: alert, well  appearing, and in no distress  Mental status: alert, oriented to person, place, and time  Skin: warm & dry   Extremities: Edema: Trace    Cardiovascular: normal heart rate noted  Respiratory: normal respiratory effort, no distress  Abdomen: gravid, soft, non-tender  Pelvic: Cervical exam deferred         Fetal Status: Fetal Heart Rate (bpm): 152 u/s   Movement: Present    Fetal Surveillance Testing today: [place] [id number] wks,cephalic,BPP [id number] BPM,posterior placenta gr 3,AFI 18.3 cm,RI .48,.65,.62=53%  Chaperone: N/A    No results found for this or any previous visit (from the past 24 hour(s)).  Assessment & Plan:  High-risk pregnancy: [id number] at [redacted]w[redacted]d with an Estimated Date of Delivery: 03/07/21   1) CHTN, stable on labetalol 200mg  BID, ASA  2) A1DM, stable  Meds: No orders of the defined types were placed in this encounter.   Labs/procedures today: U/S (after our visit)  Treatment Plan:  Growth u/s q 4wks    2x/wk testing nst/sono     Deliver 38-39wks (37wks or prn if poor control)____   Reviewed: Preterm labor symptoms and general obstetric precautions including but not limited to vaginal bleeding, contractions, leaking of fluid and fetal movement were reviewed in detail with the patient.  All questions were answered. Does have home bp cuff. Office bp cuff given: not applicable. Check bp daily, let us  know if consistently >150 and/or >95.  Follow-up: Return for As scheduled; needs NST/nurse visit on 12/2 and 12/15 please.   Future Appointments  Date Time Provider Department Center  02/10/2021  9:00 AM [facility] - [facility]TO[facility]YN [place] [facility] None  02/10/2021  9:50 AM Myna Hidalgo, DO [facility]YN  02/13/2021 10:10 AM [facility] NURSE [facility]  02/16/2021 10:30 AM [facility] - [facility]YN [place] [facility] None  02/16/2021 11:50 AM Cheral Marker, CNM [facility]  02/19/2021 10:10 AM [facility] NURSE [facility]YN  02/23/2021  9:30 AM [facility] - [facility]TO[facility]YN [place] [facility] None  02/23/2021 10:30 AM  Myna Hidalgo, DO [facility]TO[facility]YN  02/26/2021 10:30 AM [facility] NURSE [facility]    No orders of the defined types were placed in this encounter.  Cheral Marker CNM, [facility] 02/02/2021 1:49 PM

## 2021-02-10 ENCOUNTER — Ambulatory Visit (INDEPENDENT_AMBULATORY_CARE_PROVIDER_SITE_OTHER): Payer: Medicare Other

## 2021-02-10 ENCOUNTER — Other Ambulatory Visit: Payer: Self-pay

## 2021-02-10 ENCOUNTER — Ambulatory Visit (INDEPENDENT_AMBULATORY_CARE_PROVIDER_SITE_OTHER): Payer: Medicare Other | Admitting: Obstetrics & Gynecology

## 2021-02-10 ENCOUNTER — Other Ambulatory Visit (HOSPITAL_COMMUNITY)
Admission: RE | Admit: 2021-02-10 | Discharge: 2021-02-10 | Disposition: A | Discharge status: Home / Self Care | Payer: Medicare Other | Source: Ambulatory Visit | Attending: Obstetrics & Gynecology | Admitting: Obstetrics & Gynecology

## 2021-02-10 VITALS — BP 127/83 | HR 81 | Wt 223.0 lb

## 2021-02-10 DIAGNOSIS — O09893 Supervision of other high risk pregnancies, third trimester: Secondary | ICD-10-CM | POA: Diagnosis present

## 2021-02-10 DIAGNOSIS — Z3A36 36 weeks gestation of pregnancy: Secondary | ICD-10-CM | POA: Insufficient documentation

## 2021-02-10 DIAGNOSIS — O10919 Unspecified pre-existing hypertension complicating pregnancy, unspecified trimester: Secondary | ICD-10-CM | POA: Diagnosis not present

## 2021-02-10 DIAGNOSIS — O24419 Gestational diabetes mellitus in pregnancy, unspecified control: Secondary | ICD-10-CM | POA: Diagnosis not present

## 2021-02-10 DIAGNOSIS — O0993 Supervision of high risk pregnancy, unspecified, third trimester: Secondary | ICD-10-CM

## 2021-02-10 NOTE — Progress Notes (Signed)
[place] 36+3 wks,cephalic,BPP 8/8,posterior placenta gr 3,fhr 137 bpm,AFI 15.9 cm,RI .64,.63,.55=70%,EFW 3418 g 91%

## 2021-02-10 NOTE — Progress Notes (Signed)
HIGH-RISK PREGNANCY VISIT Patient name: Angel Moore MRN [id number]  Date of birth: 11-Jul-1980 Chief Complaint:   Routine Prenatal Visit  History of Present Illness:   Angel Moore is a 40 y.o. [id number] female at [redacted]w[redacted]d with an Estimated Date of Delivery: 03/07/21 being seen today for ongoing management of a high-risk pregnancy complicated by: -CHTN- Lab 200mg  bid, h/o preE Currently ASX, doing well with current meds.  Home BP wnl  -GDMA1  Forgot log, pt reports good numbers except for Thanksgiving  Today she reports no complaints.   Contractions: Not present. Vag. Bleeding: None.  Movement: Present. denies leaking of fluid.   Depression screen [facility] 2/9 12/01/2020 06/26/2019 03/20/2019  Decreased Interest 3 0 0  Down, Depressed, Hopeless 3 0 0  PHQ - 2 Score 6 0 0  Altered sleeping 0 0 0  Tired, decreased energy 3 0 1  Change in appetite 0 0 0  Feeling bad or failure about yourself  0 0 0  Trouble concentrating 0 0 0  Moving slowly or fidgety/restless 0 0 0  Suicidal thoughts 0 0 -  PHQ-9 Score 9 0 1     Current Outpatient Medications  Medication Instructions   aspirin 162 mg, Oral, Daily, Swallow whole.   Continuous Blood Gluc Receiver (DEXCOM G6 RECEIVER) DEVI 1 Device, Does not apply, As directed   Continuous Blood Gluc Sensor (DEXCOM G6 SENSOR) MISC 1 Device, Does not apply, As directed   Continuous Blood Gluc Transmit (DEXCOM G6 TRANSMITTER) MISC 1 Device, Does not apply, As directed   ferrous sulfate 325 mg, Oral, Every other day   glucose blood (ACCU-CHEK GUIDE) test strip Check blood sugars four times a day   labetalol (NORMODYNE) 200 mg, Oral, 2 times daily   prenatal vitamin w/FE, FA (PRENATAL 1 + 1) 27-1 MG TABS tablet 1 tablet, Oral, Daily     Review of Systems:   Pertinent items are noted in HPI Denies abnormal vaginal discharge w/ itching/odor/irritation, headaches, visual changes, shortness of breath, chest pain, abdominal pain, severe nausea/vomiting, or  problems with urination or bowel movements unless otherwise stated above. Pertinent History Reviewed:  Reviewed past medical,surgical, social, obstetrical and family history.  Reviewed problem list, medications and allergies. Physical Assessment:   Vitals:   02/10/21 0944  BP: 127/83  Pulse: 81  Weight: 223 lb (101.2 kg)  Body mass index is 39.5 kg/m.           Physical Examination:   General appearance: alert, well appearing, and in no distress  Mental status: normal mood, behavior, speech, dress, motor activity, and thought processes  Skin: warm & dry   Extremities: Edema: Trace    Cardiovascular: normal heart rate noted  Respiratory: normal respiratory effort, no distress  Abdomen: gravid, soft, non-tender  Pelvic: Cervical exam deferred         Fetal Status:     Movement: Present    Fetal Surveillance Testing today: 02/12/21 today-  cephalic,BPP 8/8,posterior placenta gr 3,fhr 137 bpm,AFI 15.9 cm,RI .64,.63,.55=70%,EFW 3418 g 91%  Chaperone: [place]     Assessment & Plan:  High-risk pregnancy: Angel Moore at [redacted]w[redacted]d with an Estimated Date of Delivery: 03/07/21   1) cHTN- continue Labetalol -followed by weekly testing -scheduled for RCS and salpingectomy on 12/18  2) GDMA1 -pt to bring log next visit  3) Anemia -on iron every other day, []  consider repeat H/H next week  GBS, GC/C collected  Meds: No orders of the defined types were placed  in this encounter.   Labs/procedures today: BPP/growth/doppler  Treatment Plan:  continue current OB plan as outlined above  Reviewed: Preterm labor symptoms and general obstetric precautions including but not limited to vaginal bleeding, contractions, leaking of fluid and fetal movement were reviewed in detail with the patient.  All questions were answered. Pt has home bp cuff. Check bp weekly, let us know if >140/90.   Follow-up: Return in about 1 week (around 02/17/2021) for [place] visit- as scheduled.   Future Appointments   Date Time Provider [place]  02/13/2021 10:10 AM [facility] NURSE [facility] FTOBGYN  02/16/2021 10:30 AM [facility] - FTOBGYN [place] [facility] None  02/16/2021 11:50 AM Roma Schanz, CNM [facility] FTOBGYN  02/19/2021 10:10 AM [facility]FT[facility] NURSE [facility] FTOBGYN  02/23/2021  9:30 AM [place] - FTOBGYN [place] [facility] None  02/23/2021 10:30 AM Janyth Pupa, DO [facility] FTOBGYN  02/26/2021 10:30 AM [facility] NURSE [facility] FTOBGYN    Orders Placed This Encounter  Procedures   Culture, beta strep (group b only)    Janyth Pupa, DO Attending [place], [place] for [employer], [place]

## 2021-02-11 LAB — CERVICOVAGINAL ANCILLARY ONLY
Chlamydia: NEGATIVE
Comment: NEGATIVE
Comment: NORMAL
Neisseria Gonorrhea: NEGATIVE

## 2021-02-13 ENCOUNTER — Ambulatory Visit (INDEPENDENT_AMBULATORY_CARE_PROVIDER_SITE_OTHER): Payer: Medicare Other | Admitting: *Deleted

## 2021-02-13 ENCOUNTER — Other Ambulatory Visit: Payer: Self-pay

## 2021-02-13 DIAGNOSIS — O10919 Unspecified pre-existing hypertension complicating pregnancy, unspecified trimester: Secondary | ICD-10-CM

## 2021-02-13 DIAGNOSIS — O24410 Gestational diabetes mellitus in pregnancy, diet controlled: Secondary | ICD-10-CM

## 2021-02-13 DIAGNOSIS — O0993 Supervision of high risk pregnancy, unspecified, third trimester: Secondary | ICD-10-CM

## 2021-02-13 LAB — CULTURE, BETA STREP (GROUP B ONLY): Strep Gp B Culture: POSITIVE — AB

## 2021-02-13 NOTE — Progress Notes (Addendum)
   NURSE VISIT- NST  SUBJECTIVE:  Angel Moore is a 40 y.o. [phone] female at [redacted]w[redacted]d, here for a NST for pregnancy complicated by [facility] and A1DM.  She reports active fetal movement, contractions: none, vaginal bleeding: none, membranes: intact.   OBJECTIVE:  BP (!) 151/102   Pulse 86   Wt 228 lb (103.4 kg)   LMP 05/31/2020 (Exact Date)   BMI 40.39 kg/m   Appears well, no apparent distress  Patient unable to void  NST: FHR baseline 130 bpm, Variability: moderate, Accelerations:present, Decelerations:  Absent= Cat 1/reactive Toco: none   ASSESSMENT: [id number] at [redacted]w[redacted]d with CHTN and A1DM NST reactive  PLAN: EFM strip reviewed by Dr. Charlotta Newton   Recommendations: keep next appointment as scheduled    Jobe Marker  02/13/2021 11:24 AM  Chart reviewed for nurse visit. Agree with plan of care.  Myna Hidalgo, DO 02/15/2021 8:53 PM

## 2021-02-16 ENCOUNTER — Ambulatory Visit (INDEPENDENT_AMBULATORY_CARE_PROVIDER_SITE_OTHER): Payer: Medicare Other

## 2021-02-16 ENCOUNTER — Ambulatory Visit (INDEPENDENT_AMBULATORY_CARE_PROVIDER_SITE_OTHER): Payer: Medicare Other | Admitting: Women's Health

## 2021-02-16 ENCOUNTER — Other Ambulatory Visit: Payer: Self-pay

## 2021-02-16 ENCOUNTER — Encounter: Payer: Self-pay | Admitting: Women's Health

## 2021-02-16 VITALS — BP 141/97 | HR 84 | Wt 227.0 lb

## 2021-02-16 DIAGNOSIS — O0993 Supervision of high risk pregnancy, unspecified, third trimester: Secondary | ICD-10-CM

## 2021-02-16 DIAGNOSIS — O10919 Unspecified pre-existing hypertension complicating pregnancy, unspecified trimester: Secondary | ICD-10-CM

## 2021-02-16 DIAGNOSIS — O24419 Gestational diabetes mellitus in pregnancy, unspecified control: Secondary | ICD-10-CM | POA: Diagnosis not present

## 2021-02-16 DIAGNOSIS — O09893 Supervision of other high risk pregnancies, third trimester: Secondary | ICD-10-CM | POA: Diagnosis not present

## 2021-02-16 DIAGNOSIS — Z3A37 37 weeks gestation of pregnancy: Secondary | ICD-10-CM

## 2021-02-16 DIAGNOSIS — O24410 Gestational diabetes mellitus in pregnancy, diet controlled: Secondary | ICD-10-CM

## 2021-02-16 LAB — POCT URINALYSIS DIPSTICK OB
Blood, UA: NEGATIVE
Glucose, UA: NEGATIVE
Ketones, UA: NEGATIVE
Leukocytes, UA: NEGATIVE
Nitrite, UA: NEGATIVE
POC,PROTEIN,UA: NEGATIVE

## 2021-02-16 NOTE — Patient Instructions (Addendum)
Angel Moore, thank you for choosing our office today! We appreciate the opportunity to meet your healthcare needs. You may receive a short survey by mail, e-mail, or through [employer]. If you are happy with your care we would appreciate if you could take just a few minutes to complete the survey questions. We read all of your comments and take your feedback very seriously. Thank you again for choosing our office.  Center for [employer] Team at [facility]  [facility] at [facility] ([address] [place], [place] [address]) Entrance C, located off of [address] 24/7 valet parking   Increase labetalol to 200mg  3 times a day  CLASSES: Go to [email or web address] to register for classes (childbirth, breastfeeding, waterbirth, infant CPR, daddy bootcamp, etc.)  Call the office [phone]) or go to [facility] if: You begin to have strong, frequent contractions Your water breaks.  Sometimes it is a big gush of fluid, sometimes it is just a trickle that keeps getting your panties wet or running down your legs You have vaginal bleeding.  It is normal to have a small amount of spotting if your cervix was checked.  You don't feel your baby moving like normal.  If you don't, get you something to eat and drink and lay down and focus on feeling your baby move.   If your baby is still not moving like normal, you should call the office or go to [facility].  Call the office [phone]) or go to [facility] for these signs of pre-eclampsia: Severe headache that does not go away with Tylenol Visual changes- seeing spots, double, blurred vision Pain under your right breast or upper abdomen that does not go away with Tums or heartburn medicine Nausea and/or vomiting Severe swelling in your hands, feet, and face   [facility] Pediatricians/Family Doctors [facility]): [address], [phone]           [facility]: [address], [phone]                [facility]): [address], [phone] (call to ask if accepting patients) [facility] Department: [address] [place], [place]    [facility] Pediatricians/Family Doctors [facility]): [address], [phone] [facility]: [address] [place], [place] [facility]: [address], [phone]  [facility] Doctors  [facility]): [phone] [facility]: [address] [phone]   [facility] Doctors [facility]: [address], [phone]  [facility] Doctors  [facility]: [phone], [phone]  Home Blood Pressure Monitoring for Patients   Your provider has recommended that you check your blood pressure (BP) at least once a week at home. If you do not have a blood pressure cuff at home, one will be provided for you. Contact your provider if you have not received your monitor within 1 week.   Helpful Tips for Accurate Home Blood Pressure Checks  Don't smoke, exercise, or drink caffeine 30 minutes before checking your BP Use the restroom before checking your BP (a full bladder can raise your pressure) Relax in a comfortable upright chair Feet on the ground Left arm resting comfortably on a flat surface at the level of your heart Legs uncrossed Back supported Sit quietly and don't talk Place the cuff on your  bare arm Adjust snuggly, so that only two fingertips can fit between your skin and the top of the cuff Check 2 readings separated by at least one minute Keep a log of your BP readings For a visual, please reference this diagram: http://ccnc.care/bpdiagram  Provider Name: [facility]     Phone: [phone]  Zone 1: ALL CLEAR  Continue to monitor your symptoms:  BP reading is less than 140 (top  number) or less than 90 (bottom number)  No right upper stomach pain No headaches or seeing spots No feeling nauseated or throwing up No swelling in face and hands  Zone 2: CAUTION Call your doctor's office for any of the following:  BP reading is greater than 140 (top number) or greater than 90 (bottom number)  Stomach pain under your ribs in the middle or right side Headaches or seeing spots Feeling nauseated or throwing up Swelling in face and hands  Zone 3: EMERGENCY  Seek immediate medical care if you have any of the following:  BP reading is greater than160 (top number) or greater than 110 (bottom number) Severe headaches not improving with Tylenol Serious difficulty catching your breath Any worsening symptoms from Zone 2   Braxton Hicks Contractions Contractions of the uterus can occur throughout pregnancy, but they are not always a sign that you are in labor. You may have practice contractions called Braxton Hicks contractions. These false labor contractions are sometimes confused with true labor. What are Deberah Pelton contractions? Braxton Hicks contractions are tightening movements that occur in the muscles of the uterus before labor. Unlike true labor contractions, these contractions do not result in opening (dilation) and thinning of the cervix. Toward the end of pregnancy (32-34 weeks), Braxton Hicks contractions can happen more often and may become stronger. These contractions are sometimes difficult to tell apart from true labor because they can be very uncomfortable. You should not feel embarrassed if you go to the hospital with false labor. Sometimes, the only way to tell if you are in true labor is for your health care provider to look for changes in the cervix. The health care provider will do a physical exam and may monitor your contractions. If you are not in true labor, the exam should show that your cervix is not dilating and your water has not broken. If there are  no other health problems associated with your pregnancy, it is completely safe for you to be sent home with false labor. You may continue to have Braxton Hicks contractions until you go into true labor. How to tell the difference between true labor and false labor True labor Contractions last 30-70 seconds. Contractions become very regular. Discomfort is usually felt in the top of the uterus, and it spreads to the lower abdomen and low back. Contractions do not go away with walking. Contractions usually become more intense and increase in frequency. The cervix dilates and gets thinner. False labor Contractions are usually shorter and not as strong as true labor contractions. Contractions are usually irregular. Contractions are often felt in the front of the lower abdomen and in the groin. Contractions may go away when you walk around or change positions while lying down. Contractions get weaker and are shorter-lasting as time goes on. The cervix usually does not dilate or become thin. Follow these instructions at home:  Take over-the-counter and prescription medicines only as told by your health care provider. Keep up with your usual exercises and follow other instructions from your health  care provider. Eat and drink lightly if you think you are going into labor. If Braxton Hicks contractions are making you uncomfortable: Change your position from lying down or resting to walking, or change from walking to resting. Sit and rest in a tub of warm water. Drink enough fluid to keep your urine pale yellow. Dehydration may cause these contractions. Do slow and deep breathing several times an hour. Keep all follow-up prenatal visits as told by your health care provider. This is important. Contact a health care provider if: You have a fever. You have continuous pain in your abdomen. Get help right away if: Your contractions become stronger, more regular, and closer together. You have fluid  leaking or gushing from your vagina. You pass blood-tinged mucus (bloody show). You have bleeding from your vagina. You have low back pain that you never had before. You feel your baby's head pushing down and causing pelvic pressure. Your baby is not moving inside you as much as it used to. Summary Contractions that occur before labor are called Braxton Hicks contractions, false labor, or practice contractions. Braxton Hicks contractions are usually shorter, weaker, farther apart, and less regular than true labor contractions. True labor contractions usually become progressively stronger and regular, and they become more frequent. Manage discomfort from [facility] contractions by changing position, resting in a warm bath, drinking plenty of water, or practicing deep breathing. This information is not intended to replace advice given to you by your health care provider. Make sure you discuss any questions you have with your health care provider. Document Revised: 02/11/2017 Document Reviewed: 07/15/2016 Elsevier Patient Education  2020 [employer].

## 2021-02-16 NOTE — Progress Notes (Signed)
[place] 37+2 wks,cephalic,BPP 8/8,FHR 152 bpm,posterior placenta gr 3,afi 16.9 cm,RI .52,.49,.62,.56=70%

## 2021-02-16 NOTE — Progress Notes (Signed)
HIGH-RISK PREGNANCY VISIT Patient name: Angel Moore MRN [id number]  Date of birth: Sep 08, 1980 Chief Complaint:   High Risk Gestation ([place] today)  History of Present Illness:   Angel Moore is a 40 y.o. [phone] female at [redacted]w[redacted]d with an Estimated Date of Delivery: 03/07/21 being seen today for ongoing management of a high-risk pregnancy complicated by chronic hypertension currently on labetalol 200mg  BID and diabetes mellitus A1DM.    Today she reports  home bp's 150s/90s (checking once a day), did get 165/110 one day last week but was close to time to take labetalol, so took and laid down and repeat bp was normal.  Denies ha, visual changes, ruq/epigastric pain, n/v.  All sugars wnl.  Contractions: Not present. Vag. Bleeding: None.  Movement: Present. denies leaking of fluid.   Depression screen [facility] 2/9 12/01/2020 06/26/2019 03/20/2019  Decreased Interest 3 0 0  Down, Depressed, Hopeless 3 0 0  PHQ - 2 Score 6 0 0  Altered sleeping 0 0 0  Tired, decreased energy 3 0 1  Change in appetite 0 0 0  Feeling bad or failure about yourself  0 0 0  Trouble concentrating 0 0 0  Moving slowly or fidgety/restless 0 0 0  Suicidal thoughts 0 0 -  PHQ-9 Score 9 0 1     GAD 7 : Generalized Anxiety Score 12/01/2020 06/26/2019  Nervous, Anxious, on Edge 0 0  Control/stop worrying 0 0  Worry too much - different things 0 0  Trouble relaxing 0 0  Restless 0 0  Easily annoyed or irritable 0 0  Afraid - awful might happen 0 0  Total GAD 7 Score 0 0     Review of Systems:   Pertinent items are noted in HPI Denies abnormal vaginal discharge w/ itching/odor/irritation, headaches, visual changes, shortness of breath, chest pain, abdominal pain, severe nausea/vomiting, or problems with urination or bowel movements unless otherwise stated above. Pertinent History Reviewed:  Reviewed past medical,surgical, social, obstetrical and family history.  Reviewed problem list, medications and allergies. Physical  Assessment:   Vitals:   02/16/21 1149  BP: (!) 141/97  Pulse: 84  Weight: 227 lb (103 kg)  Body mass index is 40.21 kg/m.           Physical Examination:   General appearance: alert, well appearing, and in no distress  Mental status: alert, oriented to person, place, and time  Skin: warm & dry   Extremities: Edema: None    Cardiovascular: normal heart rate noted  Respiratory: normal respiratory effort, no distress  Abdomen: gravid, soft, non-tender  Pelvic: Cervical exam deferred         Fetal Status: Fetal Heart Rate (bpm): 152 u/s   Movement: Present    Fetal Surveillance Testing today:   [place] [id number] wks,cephalic,BPP [id number] 0000000 bpm,posterior placenta gr 3,afi 16.9 cm,RI .52,.49,.62,.56=70%  Chaperone: N/A    Results for orders placed or performed in visit on 02/16/21 (from the past 24 hour(s))  POC Urinalysis Dipstick OB   Collection Time: 02/16/21 12:07 PM  Result Value Ref Range   Color, UA     Clarity, UA     Glucose, UA Negative Negative   Bilirubin, UA     Ketones, UA neg    Spec Grav, UA     Blood, UA neg    pH, UA     POC,PROTEIN,UA Negative Negative, Trace, Small (1+), Moderate (2+), Large (3+), 4+   Urobilinogen, UA     Nitrite,  UA neg    Leukocytes, UA Negative Negative   Appearance     Odor      Assessment & Plan:  High-risk pregnancy: [id number] at [redacted]w[redacted]d with an Estimated Date of Delivery: 03/07/21   1) CHTN w/ h/o pre-e x 2, asymptomatic, no proteinuria, discussed todays and home bp's w/ LHE, recommends increasing labetalol to 200mg  TID. Continue ASA. Pt to check bp QID, if >160/110 or pre-e sx let know/go to [facility]  2) A1DM, stable  3) Prev c/s x2> for rCS 12/18  Meds: No orders of the defined types were placed in this encounter.   Labs/procedures today: U/S  Treatment Plan:  2x/wk testing nst alt w/ bpp/dopp, RCS 12/18  Reviewed: Term labor symptoms and general obstetric precautions including but not limited to vaginal bleeding, contractions,  leaking of fluid and fetal movement were reviewed in detail with the patient.  All questions were answered. Does have home bp cuff. Office bp cuff given: not applicable. Check bp four times daily, let 1/19 know if consistently >160 and/or >110.  Follow-up: Return for As scheduled.   Future Appointments  Date Time Provider Department Center  02/19/2021  3:10 PM [facility] NURSE [facility]  02/23/2021  9:30 AM [facility] 14/02/2021 [facility] None  02/23/2021 10:30 AM 14/02/2021, DO [facility]  02/26/2021 10:30 AM [facility] NURSE [facility]    Orders Placed This Encounter  Procedures   POC Urinalysis Dipstick OB    02/28/2021 CNM, [facility] 02/16/2021 12:26 PM

## 2021-02-18 ENCOUNTER — Telehealth: Payer: Self-pay | Admitting: Women's Health

## 2021-02-18 ENCOUNTER — Other Ambulatory Visit (INDEPENDENT_AMBULATORY_CARE_PROVIDER_SITE_OTHER): Payer: Medicare Other | Admitting: *Deleted

## 2021-02-18 ENCOUNTER — Encounter: Payer: Self-pay | Admitting: *Deleted

## 2021-02-18 ENCOUNTER — Other Ambulatory Visit: Payer: Self-pay

## 2021-02-18 VITALS — BP 141/98 | HR 85 | Wt 227.0 lb

## 2021-02-18 DIAGNOSIS — Z3A37 37 weeks gestation of pregnancy: Secondary | ICD-10-CM

## 2021-02-18 DIAGNOSIS — O0993 Supervision of high risk pregnancy, unspecified, third trimester: Secondary | ICD-10-CM

## 2021-02-18 NOTE — Telephone Encounter (Signed)
BP has been running high the last 2 days. The lowest the bottom # has been is 98. No headache or blurred vision. I spoke with Louie Bun, CNM. Selena Batten wants pt to come in for a BP check here at the office. Pt aware and will try to find a ride. She is about 30 minutes from the office. Pt was advised if she can't find a ride by 3:45 pm, let me know. Pt voiced understanding. JSY

## 2021-02-18 NOTE — Progress Notes (Addendum)
   NURSE VISIT- BLOOD PRESSURE CHECK  SUBJECTIVE:  Angel Moore is a 40 y.o. [phone] female here for BP check. She is [redacted]w[redacted]d pregnant    HYPERTENSION ROS:  Pregnant/postpartum:  Severe headaches that don't go away with tylenol/other medicines: No  Visual changes (seeing spots/double/blurred vision) Yes  Severe pain under right breast breast or in center of upper chest No  Severe nausea/vomiting No  Taking medicines as instructed yes   OBJECTIVE:  BP (!) 141/98   Pulse 85   Wt 227 lb (103 kg)   LMP 05/31/2020 (Exact Date)   BMI 40.21 kg/m   BP from our machine was 122/86 pulse 83.  Appearance alert, well appearing, and in no distress.  ASSESSMENT: Pregnancy [redacted]w[redacted]d  blood pressure check  PLAN: Discussed with [name], CNM, [facility]   Recommendations:  stop checking BP @ home unless feeling bad. BP cuff looks like it's not fitting arm properly.     Follow-up: as scheduled   Malachy Mood  02/18/2021 4:33 PM  Chart reviewed for nurse visit. Agree with plan of care. Pt didn't void. BP here great. 19/12pt difference from ours. Reviewed pre-e s/s reasons to seek care. Keep next appt as scheduled. Cheral Marker, [place] 02/18/2021 5:07 PM

## 2021-02-18 NOTE — Telephone Encounter (Signed)
Patient wanted to see if Angel Moore could call her about some bp issues she is having. She says they are going up and down. Her most recent measurement was 149/101. She states that the highest the diastolic reading was 115 to 116. When she takes her blood pressure medicines,the diastolic reading goes down to 11-65.

## 2021-02-19 ENCOUNTER — Other Ambulatory Visit: Payer: Medicare Other

## 2021-02-19 ENCOUNTER — Ambulatory Visit (INDEPENDENT_AMBULATORY_CARE_PROVIDER_SITE_OTHER): Payer: Medicare Other | Admitting: *Deleted

## 2021-02-19 VITALS — BP 132/93 | HR 83 | Wt 226.2 lb

## 2021-02-19 DIAGNOSIS — O0993 Supervision of high risk pregnancy, unspecified, third trimester: Secondary | ICD-10-CM | POA: Diagnosis not present

## 2021-02-19 DIAGNOSIS — O10919 Unspecified pre-existing hypertension complicating pregnancy, unspecified trimester: Secondary | ICD-10-CM | POA: Diagnosis not present

## 2021-02-19 DIAGNOSIS — O24410 Gestational diabetes mellitus in pregnancy, diet controlled: Secondary | ICD-10-CM | POA: Diagnosis not present

## 2021-02-19 LAB — POCT URINALYSIS DIPSTICK OB
Blood, UA: NEGATIVE
Glucose, UA: NEGATIVE
Ketones, UA: NEGATIVE
Leukocytes, UA: NEGATIVE
Nitrite, UA: NEGATIVE
POC,PROTEIN,UA: NEGATIVE

## 2021-02-19 NOTE — Progress Notes (Addendum)
   NURSE VISIT- NST  SUBJECTIVE:  Angel Moore is a 40 y.o. [phone] female at [redacted]w[redacted]d, here for a NST for pregnancy complicated by [facility] and A1DM.  She reports active fetal movement, contractions: none, vaginal bleeding: none, membranes: intact.   OBJECTIVE:  BP (!) 132/93   Pulse 83   Wt 226 lb 3.2 oz (102.6 kg)   LMP 05/31/2020 (Exact Date)   BMI 40.07 kg/m   Appears well, no apparent distress  Results for orders placed or performed in visit on 02/19/21 (from the past 24 hour(s))  POC Urinalysis Dipstick OB   Collection Time: 02/19/21  4:37 PM  Result Value Ref Range   Color, UA     Clarity, UA     Glucose, UA Negative Negative   Bilirubin, UA     Ketones, UA neg    Spec Grav, UA     Blood, UA neg    pH, UA     POC,PROTEIN,UA Negative Negative, Trace, Small (1+), Moderate (2+), Large (3+), 4+   Urobilinogen, UA     Nitrite, UA neg    Leukocytes, UA Negative Negative   Appearance     Odor      NST: FHR baseline 135 bpm, Variability: moderate, Accelerations:present, Decelerations:  Absent= Cat 1/reactive Toco: none   ASSESSMENT: [id number] at [redacted]w[redacted]d with CHTN and A1DM NST reactive  PLAN: EFM strip reviewed by Shawna Clamp, CNM  Recommendations: keep next appointment as scheduled    Jobe Marker  02/19/2021 4:37 PM  Attestation of Attending Supervision of Nursing Visit Encounter: Evaluation and management procedures were performed by the nursing staff under my supervision and collaboration.  I have reviewed the nurse's note and chart, and I agree with the management and plan.  Rockne Coons MD Attending Physician for the Center for [facility] 04/26/2021 1:28 PM

## 2021-02-23 ENCOUNTER — Other Ambulatory Visit: Payer: Medicare Other

## 2021-02-23 ENCOUNTER — Encounter: Payer: Medicare Other | Admitting: Obstetrics & Gynecology

## 2021-02-26 ENCOUNTER — Ambulatory Visit (INDEPENDENT_AMBULATORY_CARE_PROVIDER_SITE_OTHER): Payer: Medicare Other | Admitting: Obstetrics & Gynecology

## 2021-02-26 ENCOUNTER — Other Ambulatory Visit: Payer: Self-pay

## 2021-02-26 ENCOUNTER — Other Ambulatory Visit: Payer: Medicare Other

## 2021-02-26 ENCOUNTER — Encounter: Payer: Self-pay | Admitting: Obstetrics & Gynecology

## 2021-02-26 VITALS — BP 155/104 | HR 81

## 2021-02-26 DIAGNOSIS — Z3A38 38 weeks gestation of pregnancy: Secondary | ICD-10-CM

## 2021-02-26 DIAGNOSIS — O0993 Supervision of high risk pregnancy, unspecified, third trimester: Secondary | ICD-10-CM

## 2021-02-26 LAB — POCT URINALYSIS DIPSTICK OB
Blood, UA: NEGATIVE
Glucose, UA: NEGATIVE
Ketones, UA: NEGATIVE
Leukocytes, UA: NEGATIVE
Nitrite, UA: NEGATIVE
POC,PROTEIN,UA: NEGATIVE

## 2021-02-26 NOTE — Progress Notes (Signed)
HIGH-RISK PREGNANCY VISIT Patient name: Angel Moore MRN [id number]  Date of birth: 1980-10-12 Chief Complaint:   Routine Prenatal Visit (NST)  History of Present Illness:   Angel Moore is a 40 y.o. [phone] female at [redacted]w[redacted]d with an Estimated Date of Delivery: 03/07/21 being seen today for ongoing management of a high-risk pregnancy complicated by -chronic HTN- currently on Labetalol 200mg  tid -prior C-section x 2     Today she reports no complaints.   Contractions: Not present. Vag. Bleeding: Scant.  Movement: Present. denies leaking of fluid.   Depression screen [facility] 2/9 12/01/2020 06/26/2019 03/20/2019  Decreased Interest 3 0 0  Down, Depressed, Hopeless 3 0 0  PHQ - 2 Score 6 0 0  Altered sleeping 0 0 0  Tired, decreased energy 3 0 1  Change in appetite 0 0 0  Feeling bad or failure about yourself  0 0 0  Trouble concentrating 0 0 0  Moving slowly or fidgety/restless 0 0 0  Suicidal thoughts 0 0 -  PHQ-9 Score 9 0 1     Current Outpatient Medications  Medication Instructions   aspirin 162 mg, Oral, Daily, Swallow whole.   Continuous Blood Gluc Receiver (DEXCOM G6 RECEIVER) DEVI 1 Device, Does not apply, As directed   Continuous Blood Gluc Transmit (DEXCOM G6 TRANSMITTER) MISC 1 Device, Does not apply, As directed   ferrous sulfate 325 mg, Oral, Every other day   glucose blood (ACCU-CHEK GUIDE) test strip Check blood sugars four times a day   labetalol (NORMODYNE) 200 mg, Oral, 2 times daily   prenatal vitamin w/FE, FA (PRENATAL 1 + 1) 27-1 MG TABS tablet 1 tablet, Oral, Daily     Review of Systems:   Pertinent items are noted in HPI Denies abnormal vaginal discharge w/ itching/odor/irritation, headaches, visual changes, shortness of breath, chest pain, abdominal pain, severe nausea/vomiting, or problems with urination or bowel movements unless otherwise stated above. Pertinent History Reviewed:  Reviewed past medical,surgical, social, obstetrical and family history.   Reviewed problem list, medications and allergies. Physical Assessment:   Vitals:   02/26/21 1017 02/26/21 1104  BP: (!) 152/100 (!) 155/104  Pulse: 77 81  There is no height or weight on file to calculate BMI.           Physical Examination:   General appearance: alert, well appearing, and in no distress  Mental status: normal mood, behavior, speech, dress, motor activity, and thought processes  Skin: warm & dry   Extremities: Edema: Trace    Cardiovascular: normal heart rate noted  Respiratory: normal respiratory effort, no distress  Abdomen: gravid, soft, non-tender  Pelvic: Cervical exam deferred         Fetal Status:     Movement: Present    Fetal Surveillance Testing today: NST  NST being performed due to chronic HTN   Fetal Monitoring:  Baseline: 140 bpm, Variability: moderate, Accelerations: present, The accelerations are >15 bpm and more than 2 in 20 minutes, and Decelerations: Absent      Final diagnosis:  Reactive NST      Chaperone: N/A    Results for orders placed or performed in visit on 02/26/21 (from the past 24 hour(s))  POC Urinalysis Dipstick OB   Collection Time: 02/26/21 11:07 AM  Result Value Ref Range   Color, UA     Clarity, UA     Glucose, UA Negative Negative   Bilirubin, UA     Ketones, UA neg  Spec Grav, UA     Blood, UA neg    pH, UA     POC,PROTEIN,UA Negative Negative, Trace, Small (1+), Moderate (2+), Large (3+), 4+   Urobilinogen, UA     Nitrite, UA neg    Leukocytes, UA Negative Negative   Appearance     Odor       Assessment & Plan:  High-risk pregnancy: [id number] at [redacted]w[redacted]d with an Estimated Date of Delivery: 03/07/21   1) cHTN -Plan to increase to 300mg   -UA negative for protein -currently asymptomatic -Given strict preeclampsia precautions  2) Prior C-section x2 -scheduled for repeat C-section on Sunday  Meds: No orders of the defined types were placed in this encounter.  Labs/procedures today: NST  Treatment  Plan:  as outlined above  Reviewed: Term labor symptoms and general obstetric precautions including but not limited to vaginal bleeding, contractions, leaking of fluid and fetal movement were reviewed in detail with the patient.  All questions were answered. Pt has home bp cuff. Check bp weekly, let Wednesday know if >140/90.   Follow-up: Return for BP check tomorrow, 1wk PP check.   Future Appointments  Date Time Provider Department Center  02/27/2021 11:10 AM [facility] NURSE [facility]    Orders Placed This Encounter  Procedures   POC Urinalysis Dipstick OB    03/01/2021, DO Attending Obstetrician & Gynecologist, [facility] Practice Center for [facility] [place], [facility]

## 2021-02-27 ENCOUNTER — Other Ambulatory Visit: Payer: Self-pay

## 2021-02-27 ENCOUNTER — Ambulatory Visit (INDEPENDENT_AMBULATORY_CARE_PROVIDER_SITE_OTHER): Payer: Medicare Other | Admitting: *Deleted

## 2021-02-27 ENCOUNTER — Encounter (HOSPITAL_COMMUNITY): Payer: Self-pay | Admitting: Obstetrics & Gynecology

## 2021-02-27 ENCOUNTER — Encounter (HOSPITAL_COMMUNITY)
Admission: AD | Disposition: A | Discharge status: Home / Self Care | Payer: Self-pay | Source: Home / Self Care | Attending: Obstetrics & Gynecology

## 2021-02-27 ENCOUNTER — Inpatient Hospital Stay (HOSPITAL_COMMUNITY)
Admission: RE | Admit: 2021-02-27 | Payer: Medicare Other | Source: Home / Self Care | Admitting: Obstetrics & Gynecology

## 2021-02-27 ENCOUNTER — Inpatient Hospital Stay (HOSPITAL_COMMUNITY)
Admission: AD | Admit: 2021-02-27 | Discharge: 2021-03-01 | DRG: 787 | Disposition: A | Discharge status: Home / Self Care | Payer: Medicare Other | Attending: Obstetrics & Gynecology | Admitting: Obstetrics & Gynecology

## 2021-02-27 ENCOUNTER — Inpatient Hospital Stay (HOSPITAL_COMMUNITY): Payer: Medicare Other | Admitting: Anesthesiology

## 2021-02-27 VITALS — BP 155/104 | HR 81

## 2021-02-27 DIAGNOSIS — O99334 Smoking (tobacco) complicating childbirth: Secondary | ICD-10-CM | POA: Diagnosis present

## 2021-02-27 DIAGNOSIS — O24420 Gestational diabetes mellitus in childbirth, diet controlled: Secondary | ICD-10-CM | POA: Diagnosis present

## 2021-02-27 DIAGNOSIS — O99824 Streptococcus B carrier state complicating childbirth: Secondary | ICD-10-CM | POA: Diagnosis present

## 2021-02-27 DIAGNOSIS — O24410 Gestational diabetes mellitus in pregnancy, diet controlled: Secondary | ICD-10-CM | POA: Diagnosis present

## 2021-02-27 DIAGNOSIS — O99214 Obesity complicating childbirth: Secondary | ICD-10-CM | POA: Diagnosis present

## 2021-02-27 DIAGNOSIS — O10919 Unspecified pre-existing hypertension complicating pregnancy, unspecified trimester: Secondary | ICD-10-CM | POA: Diagnosis present

## 2021-02-27 DIAGNOSIS — F17210 Nicotine dependence, cigarettes, uncomplicated: Secondary | ICD-10-CM | POA: Diagnosis present

## 2021-02-27 DIAGNOSIS — O1002 Pre-existing essential hypertension complicating childbirth: Secondary | ICD-10-CM | POA: Diagnosis present

## 2021-02-27 DIAGNOSIS — O10913 Unspecified pre-existing hypertension complicating pregnancy, third trimester: Secondary | ICD-10-CM | POA: Diagnosis present

## 2021-02-27 DIAGNOSIS — Z98891 History of uterine scar from previous surgery: Secondary | ICD-10-CM | POA: Diagnosis not present

## 2021-02-27 DIAGNOSIS — D62 Acute posthemorrhagic anemia: Secondary | ICD-10-CM | POA: Diagnosis not present

## 2021-02-27 DIAGNOSIS — Z3A38 38 weeks gestation of pregnancy: Secondary | ICD-10-CM

## 2021-02-27 DIAGNOSIS — Z20822 Contact with and (suspected) exposure to covid-19: Secondary | ICD-10-CM | POA: Diagnosis present

## 2021-02-27 DIAGNOSIS — D573 Sickle-cell trait: Secondary | ICD-10-CM | POA: Diagnosis present

## 2021-02-27 DIAGNOSIS — O9081 Anemia of the puerperium: Secondary | ICD-10-CM | POA: Diagnosis not present

## 2021-02-27 DIAGNOSIS — O114 Pre-existing hypertension with pre-eclampsia, complicating childbirth: Principal | ICD-10-CM | POA: Diagnosis present

## 2021-02-27 DIAGNOSIS — O0990 Supervision of high risk pregnancy, unspecified, unspecified trimester: Secondary | ICD-10-CM

## 2021-02-27 DIAGNOSIS — O1414 Severe pre-eclampsia complicating childbirth: Secondary | ICD-10-CM | POA: Diagnosis not present

## 2021-02-27 DIAGNOSIS — O34211 Maternal care for low transverse scar from previous cesarean delivery: Secondary | ICD-10-CM | POA: Diagnosis present

## 2021-02-27 DIAGNOSIS — O0993 Supervision of high risk pregnancy, unspecified, third trimester: Secondary | ICD-10-CM

## 2021-02-27 LAB — RESP PANEL BY RT-PCR (FLU A&B, COVID) ARPGX2
Influenza A by PCR: NEGATIVE
Influenza B by PCR: NEGATIVE
SARS Coronavirus 2 by RT PCR: NEGATIVE

## 2021-02-27 LAB — COMPREHENSIVE METABOLIC PANEL WITH GFR
ALT: 24 U/L (ref 0–44)
AST: 27 U/L (ref 15–41)
Albumin: 2.7 g/dL — ABNORMAL LOW (ref 3.5–5.0)
Alkaline Phosphatase: 169 U/L — ABNORMAL HIGH (ref 38–126)
Anion gap: 7 (ref 5–15)
BUN: 10 mg/dL (ref 6–20)
CO2: 21 mmol/L — ABNORMAL LOW (ref 22–32)
Calcium: 8.9 mg/dL (ref 8.9–10.3)
Chloride: 107 mmol/L (ref 98–111)
Creatinine, Ser: 0.71 mg/dL (ref 0.44–1.00)
GFR, Estimated: >60 mL/min
Glucose, Bld: 82 mg/dL (ref 70–99)
Potassium: 4 mmol/L (ref 3.5–5.1)
Sodium: 135 mmol/L (ref 135–145)
Total Bilirubin: 1 mg/dL (ref 0.3–1.2)
Total Protein: 6.3 g/dL — ABNORMAL LOW (ref 6.5–8.1)

## 2021-02-27 LAB — CBC
HCT: 36.6 % (ref 36.0–46.0)
Hemoglobin: 11.4 g/dL — ABNORMAL LOW (ref 12.0–15.0)
MCH: 24.2 pg — ABNORMAL LOW (ref 26.0–34.0)
MCHC: 31.1 g/dL (ref 30.0–36.0)
MCV: 77.5 fL — ABNORMAL LOW (ref 80.0–100.0)
Platelets: 281 10*3/uL (ref 150–400)
RBC: 4.72 MIL/uL (ref 3.87–5.11)
RDW: 29.2 % — ABNORMAL HIGH (ref 11.5–15.5)
WBC: 12.5 10*3/uL — ABNORMAL HIGH (ref 4.0–10.5)
nRBC: 0 % (ref 0.0–0.2)

## 2021-02-27 LAB — POCT FERN TEST: POCT Fern Test: NEGATIVE

## 2021-02-27 LAB — WET PREP, GENITAL
Clue Cells Wet Prep HPF POC: NONE SEEN
Sperm: NONE SEEN
Trich, Wet Prep: NONE SEEN
WBC, Wet Prep HPF POC: >=10 — AB (ref <10)
Yeast Wet Prep HPF POC: NONE SEEN

## 2021-02-27 LAB — TYPE AND SCREEN
ABO/RH(D): O POS
Antibody Screen: NEGATIVE

## 2021-02-27 LAB — PROTEIN / CREATININE RATIO, URINE
Creatinine, Urine: 44.46 mg/dL
Protein Creatinine Ratio: 0.16 mg/mg{Cre} — ABNORMAL HIGH (ref 0.00–0.15)
Total Protein, Urine: 7 mg/dL

## 2021-02-27 LAB — GLUCOSE, CAPILLARY
Glucose-Capillary: 88 mg/dL (ref 70–99)
Glucose-Capillary: 76 mg/dL (ref 70–99)

## 2021-02-27 SURGERY — Surgical Case
Anesthesia: Spinal | Site: Abdomen | Wound class: Clean Contaminated

## 2021-02-27 MED ORDER — DEXAMETHASONE SODIUM PHOSPHATE 10 MG/ML IJ SOLN
INTRAMUSCULAR | Status: AC
  Filled 2021-02-27: qty 4

## 2021-02-27 MED ORDER — OXYTOCIN-SODIUM CHLORIDE 30-0.9 UT/500ML-% IV SOLN
INTRAVENOUS | Status: DC | PRN
  Administered 2021-02-27: 30 [IU] via INTRAVENOUS

## 2021-02-27 MED ORDER — MAGNESIUM SULFATE BOLUS VIA INFUSION
4.0000 g | Freq: Once | INTRAVENOUS | Status: AC
  Administered 2021-02-27: 4 g via INTRAVENOUS
  Filled 2021-02-27: qty 1000

## 2021-02-27 MED ORDER — COCONUT OIL OIL: 1.0000 "application " | TOPICAL_OIL | Status: DC | PRN

## 2021-02-27 MED ORDER — NIFEDIPINE ER OSMOTIC RELEASE 30 MG PO TB24
30.0000 mg | ORAL_TABLET | Freq: Every day | ORAL | Status: DC
  Administered 2021-02-27 – 2021-02-28 (×2): 30 mg via ORAL
  Filled 2021-02-27 (×2): qty 1

## 2021-02-27 MED ORDER — ACETAMINOPHEN 10 MG/ML IV SOLN
INTRAVENOUS | Status: AC
  Filled 2021-02-27: qty 100

## 2021-02-27 MED ORDER — TETANUS-DIPHTH-ACELL PERTUSSIS 5-2.5-18.5 LF-MCG/0.5 IM SUSY: 0.5000 mL | PREFILLED_SYRINGE | Freq: Once | INTRAMUSCULAR | Status: DC

## 2021-02-27 MED ORDER — FENTANYL CITRATE (PF) 100 MCG/2ML IJ SOLN
INTRAMUSCULAR | Status: AC
  Filled 2021-02-27: qty 2

## 2021-02-27 MED ORDER — TRANEXAMIC ACID-NACL 1000-0.7 MG/100ML-% IV SOLN
1000.0000 mg | INTRAVENOUS | Status: AC
  Administered 2021-02-27: 1000 mg via INTRAVENOUS

## 2021-02-27 MED ORDER — SODIUM CHLORIDE 0.9 % IR SOLN
Status: DC | PRN
  Administered 2021-02-27: 1000 mL

## 2021-02-27 MED ORDER — SUCCINYLCHOLINE CHLORIDE 200 MG/10ML IV SOSY
PREFILLED_SYRINGE | INTRAVENOUS | Status: AC
  Filled 2021-02-27: qty 10

## 2021-02-27 MED ORDER — MORPHINE SULFATE (PF) 0.5 MG/ML IJ SOLN
INTRAMUSCULAR | Status: AC
  Filled 2021-02-27: qty 10

## 2021-02-27 MED ORDER — ACETAMINOPHEN 500 MG PO TABS
1000.0000 mg | ORAL_TABLET | Freq: Four times a day (QID) | ORAL | Status: DC
  Administered 2021-02-27 – 2021-03-01 (×7): 1000 mg via ORAL
  Filled 2021-02-27 (×7): qty 2

## 2021-02-27 MED ORDER — SIMETHICONE 80 MG PO CHEW
80.0000 mg | CHEWABLE_TABLET | Freq: Three times a day (TID) | ORAL | Status: DC
  Administered 2021-02-28 – 2021-03-01 (×4): 80 mg via ORAL
  Filled 2021-02-27 (×4): qty 1

## 2021-02-27 MED ORDER — ACETAMINOPHEN 160 MG/5ML PO SOLN: 1000.0000 mg | Freq: Once | ORAL | Status: DC

## 2021-02-27 MED ORDER — POVIDONE-IODINE 10 % EX SWAB
2.0000 "application " | Freq: Once | CUTANEOUS | Status: AC
  Administered 2021-02-27: 2 via TOPICAL

## 2021-02-27 MED ORDER — STERILE WATER FOR IRRIGATION IR SOLN
Status: DC | PRN
  Administered 2021-02-27: 1000 mL

## 2021-02-27 MED ORDER — ONDANSETRON HCL 4 MG/2ML IJ SOLN
INTRAMUSCULAR | Status: AC
  Filled 2021-02-27: qty 2

## 2021-02-27 MED ORDER — DEXAMETHASONE SODIUM PHOSPHATE 4 MG/ML IJ SOLN
INTRAMUSCULAR | Status: AC
  Filled 2021-02-27: qty 2

## 2021-02-27 MED ORDER — SIMETHICONE 80 MG PO CHEW: 80.0000 mg | CHEWABLE_TABLET | ORAL | Status: DC | PRN

## 2021-02-27 MED ORDER — FENTANYL CITRATE (PF) 100 MCG/2ML IJ SOLN
INTRAMUSCULAR | Status: DC | PRN
  Administered 2021-02-27: 15 ug via INTRATHECAL

## 2021-02-27 MED ORDER — LACTATED RINGERS IV SOLN: INTRAVENOUS | Status: DC

## 2021-02-27 MED ORDER — OXYTOCIN-SODIUM CHLORIDE 30-0.9 UT/500ML-% IV SOLN: 2.5000 [IU]/h | INTRAVENOUS | Status: AC

## 2021-02-27 MED ORDER — BUPIVACAINE IN DEXTROSE 0.75-8.25 % IT SOLN
INTRATHECAL | Status: DC | PRN
  Administered 2021-02-27: 1.6 mL via INTRATHECAL

## 2021-02-27 MED ORDER — DIPHENHYDRAMINE HCL 50 MG/ML IJ SOLN
12.5000 mg | INTRAMUSCULAR | Status: DC | PRN
  Administered 2021-02-27 (×2): 12.5 mg via INTRAVENOUS
  Filled 2021-02-27: qty 1

## 2021-02-27 MED ORDER — ACETAMINOPHEN 500 MG PO TABS: 1000.0000 mg | ORAL_TABLET | Freq: Once | ORAL | Status: DC

## 2021-02-27 MED ORDER — PHENYLEPHRINE HCL-NACL 20-0.9 MG/250ML-% IV SOLN
INTRAVENOUS | Status: DC | PRN
  Administered 2021-02-27: 60 ug/min via INTRAVENOUS

## 2021-02-27 MED ORDER — PHENYLEPHRINE 40 MCG/ML (10ML) SYRINGE FOR IV PUSH (FOR BLOOD PRESSURE SUPPORT)
PREFILLED_SYRINGE | INTRAVENOUS | Status: AC
  Filled 2021-02-27: qty 10

## 2021-02-27 MED ORDER — FUROSEMIDE 20 MG PO TABS
20.0000 mg | ORAL_TABLET | Freq: Two times a day (BID) | ORAL | Status: DC
  Administered 2021-02-28 – 2021-03-01 (×3): 20 mg via ORAL
  Filled 2021-02-27 (×5): qty 1

## 2021-02-27 MED ORDER — DIPHENHYDRAMINE HCL 25 MG PO CAPS
25.0000 mg | ORAL_CAPSULE | Freq: Four times a day (QID) | ORAL | Status: DC | PRN
  Administered 2021-02-27: 25 mg via ORAL
  Filled 2021-02-27: qty 1

## 2021-02-27 MED ORDER — LABETALOL HCL 5 MG/ML IV SOLN: 80.0000 mg | INTRAVENOUS | Status: DC | PRN

## 2021-02-27 MED ORDER — IBUPROFEN 600 MG PO TABS
600.0000 mg | ORAL_TABLET | Freq: Four times a day (QID) | ORAL | Status: DC
  Administered 2021-02-28 – 2021-03-01 (×6): 600 mg via ORAL
  Filled 2021-02-27 (×6): qty 1

## 2021-02-27 MED ORDER — SODIUM CHLORIDE 0.9 % IV SOLN
500.0000 mg | INTRAVENOUS | Status: AC
  Administered 2021-02-27: 500 mg via INTRAVENOUS
  Filled 2021-02-27: qty 5

## 2021-02-27 MED ORDER — DIPHENHYDRAMINE HCL 50 MG/ML IJ SOLN
INTRAMUSCULAR | Status: AC
  Filled 2021-02-27: qty 1

## 2021-02-27 MED ORDER — PHENYLEPHRINE HCL (PRESSORS) 10 MG/ML IV SOLN
INTRAVENOUS | Status: DC | PRN
  Administered 2021-02-27 (×3): 40 ug via INTRAVENOUS

## 2021-02-27 MED ORDER — LABETALOL HCL 5 MG/ML IV SOLN
20.0000 mg | INTRAVENOUS | Status: DC | PRN
  Administered 2021-02-27: 20 mg via INTRAVENOUS

## 2021-02-27 MED ORDER — SODIUM CHLORIDE 0.9 % IV SOLN
2.0000 g | INTRAVENOUS | Status: AC
  Administered 2021-02-27: 2 g via INTRAVENOUS
  Filled 2021-02-27: qty 2

## 2021-02-27 MED ORDER — ONDANSETRON HCL 4 MG/2ML IJ SOLN
INTRAMUSCULAR | Status: AC
  Filled 2021-02-27: qty 4

## 2021-02-27 MED ORDER — OXYCODONE HCL 5 MG PO TABS
5.0000 mg | ORAL_TABLET | ORAL | Status: DC | PRN
  Administered 2021-02-28: 10 mg via ORAL
  Administered 2021-02-28: 5 mg via ORAL
  Administered 2021-03-01: 04:00:00 10 mg via ORAL
  Administered 2021-03-01: 10:00:00 5 mg via ORAL
  Filled 2021-02-27: qty 1
  Filled 2021-02-27 (×2): qty 2
  Filled 2021-02-27: qty 1

## 2021-02-27 MED ORDER — MAGNESIUM SULFATE 40 GM/1000ML IV SOLN
2.0000 g/h | INTRAVENOUS | Status: AC
  Administered 2021-02-27 – 2021-02-28 (×2): 2 g/h via INTRAVENOUS
  Filled 2021-02-27: qty 1000

## 2021-02-27 MED ORDER — KETOROLAC TROMETHAMINE 30 MG/ML IJ SOLN
INTRAMUSCULAR | Status: AC
  Filled 2021-02-27: qty 1

## 2021-02-27 MED ORDER — FENTANYL CITRATE (PF) 100 MCG/2ML IJ SOLN: 25.0000 ug | INTRAMUSCULAR | Status: DC | PRN

## 2021-02-27 MED ORDER — KETOROLAC TROMETHAMINE 30 MG/ML IJ SOLN
30.0000 mg | Freq: Once | INTRAMUSCULAR | Status: AC
  Administered 2021-02-27: 30 mg via INTRAVENOUS

## 2021-02-27 MED ORDER — PROMETHAZINE HCL 25 MG/ML IJ SOLN: 6.2500 mg | INTRAMUSCULAR | Status: DC | PRN

## 2021-02-27 MED ORDER — OXYTOCIN-SODIUM CHLORIDE 30-0.9 UT/500ML-% IV SOLN
INTRAVENOUS | Status: AC
  Filled 2021-02-27: qty 500

## 2021-02-27 MED ORDER — TRANEXAMIC ACID-NACL 1000-0.7 MG/100ML-% IV SOLN
INTRAVENOUS | Status: AC
  Filled 2021-02-27: qty 100

## 2021-02-27 MED ORDER — SENNOSIDES-DOCUSATE SODIUM 8.6-50 MG PO TABS
2.0000 | ORAL_TABLET | Freq: Every day | ORAL | Status: DC
  Administered 2021-02-28 – 2021-03-01 (×2): 2 via ORAL
  Filled 2021-02-27 (×2): qty 2

## 2021-02-27 MED ORDER — SOD CITRATE-CITRIC ACID 500-334 MG/5ML PO SOLN
30.0000 mL | ORAL | Status: AC
  Administered 2021-02-27: 30 mL via ORAL
  Filled 2021-02-27: qty 30

## 2021-02-27 MED ORDER — LABETALOL HCL 5 MG/ML IV SOLN
40.0000 mg | INTRAVENOUS | Status: DC | PRN
  Administered 2021-02-27: 40 mg via INTRAVENOUS
  Filled 2021-02-27: qty 8

## 2021-02-27 MED ORDER — DEXAMETHASONE SODIUM PHOSPHATE 4 MG/ML IJ SOLN
INTRAMUSCULAR | Status: DC | PRN
  Administered 2021-02-27: 8 mg via INTRAVENOUS

## 2021-02-27 MED ORDER — MENTHOL 3 MG MT LOZG: 1.0000 | LOZENGE | OROMUCOSAL | Status: DC | PRN

## 2021-02-27 MED ORDER — MORPHINE SULFATE (PF) 0.5 MG/ML IJ SOLN
INTRAMUSCULAR | Status: DC | PRN
  Administered 2021-02-27: 150 ug via INTRATHECAL

## 2021-02-27 MED ORDER — ONDANSETRON HCL 4 MG/2ML IJ SOLN
INTRAMUSCULAR | Status: DC | PRN
  Administered 2021-02-27: 4 mg via INTRAVENOUS

## 2021-02-27 MED ORDER — HYDRALAZINE HCL 20 MG/ML IJ SOLN: 10.0000 mg | INTRAMUSCULAR | Status: DC | PRN

## 2021-02-27 MED ORDER — PRENATAL MULTIVITAMIN CH
1.0000 | ORAL_TABLET | Freq: Every day | ORAL | Status: DC
  Administered 2021-03-01: 09:00:00 1 via ORAL
  Filled 2021-02-27: qty 1

## 2021-02-27 MED ORDER — ENOXAPARIN SODIUM 60 MG/0.6ML IJ SOSY
50.0000 mg | PREFILLED_SYRINGE | INTRAMUSCULAR | Status: DC
  Administered 2021-02-28: 50 mg via SUBCUTANEOUS
  Filled 2021-02-27: qty 0.6

## 2021-02-27 MED ORDER — DIBUCAINE (PERIANAL) 1 % EX OINT
1.0000 "application " | TOPICAL_OINTMENT | CUTANEOUS | Status: DC | PRN
  Administered 2021-03-01: 1 via RECTAL
  Filled 2021-02-27: qty 28

## 2021-02-27 MED ORDER — WITCH HAZEL-GLYCERIN EX PADS
1.0000 "application " | MEDICATED_PAD | CUTANEOUS | Status: DC | PRN
  Administered 2021-03-01: 1 via TOPICAL

## 2021-02-27 MED ORDER — EPHEDRINE 5 MG/ML INJ
INTRAVENOUS | Status: AC
  Filled 2021-02-27: qty 5

## 2021-02-27 SURGICAL SUPPLY — 38 items
BENZOIN TINCTURE PRP APPL 2/3 (GAUZE/BANDAGES/DRESSINGS) ×3 IMPLANT
CHLORAPREP W/TINT 26ML (MISCELLANEOUS) ×3 IMPLANT
CLAMP CORD UMBIL (MISCELLANEOUS) IMPLANT
CLOSURE WOUND 1/2 X4 (GAUZE/BANDAGES/DRESSINGS) ×1
CLOTH BEACON ORANGE TIMEOUT ST (SAFETY) ×3 IMPLANT
DERMABOND ADVANCED (GAUZE/BANDAGES/DRESSINGS) ×2
DERMABOND ADVANCED .7 DNX12 (GAUZE/BANDAGES/DRESSINGS) IMPLANT
DRSG OPSITE POSTOP 4X10 (GAUZE/BANDAGES/DRESSINGS) ×3 IMPLANT
ELECT REM PT RETURN 9FT ADLT (ELECTROSURGICAL) ×3
ELECTRODE REM PT RTRN 9FT ADLT (ELECTROSURGICAL) ×1 IMPLANT
EXTRACTOR VACUUM KIWI (MISCELLANEOUS) IMPLANT
GLOVE BIOGEL PI IND STRL 7.0 (GLOVE) ×3 IMPLANT
GLOVE BIOGEL PI INDICATOR 7.0 (GLOVE) ×6
GLOVE ECLIPSE 6.5 STRL STRAW (GLOVE) ×3 IMPLANT
GOWN STRL REUS W/TWL LRG LVL3 (GOWN DISPOSABLE) ×9 IMPLANT
HEMOSTAT ARISTA ABSORB 3G PWDR (HEMOSTASIS) ×2 IMPLANT
KIT ABG SYR 3ML LUER SLIP (SYRINGE) IMPLANT
NDL HYPO 25X5/8 SAFETYGLIDE (NEEDLE) IMPLANT
NEEDLE HYPO 25X5/8 SAFETYGLIDE (NEEDLE) IMPLANT
NS IRRIG 1000ML POUR BTL (IV SOLUTION) ×3 IMPLANT
PACK C SECTION WH (CUSTOM PROCEDURE TRAY) ×3 IMPLANT
PAD ABD 7.5X8 STRL (GAUZE/BANDAGES/DRESSINGS) ×3 IMPLANT
PAD OB MATERNITY 4.3X12.25 (PERSONAL CARE ITEMS) ×3 IMPLANT
PENCIL SMOKE EVAC W/HOLSTER (ELECTROSURGICAL) ×3 IMPLANT
RTRCTR C-SECT PINK 25CM LRG (MISCELLANEOUS) ×3 IMPLANT
STRIP CLOSURE SKIN 1/2X4 (GAUZE/BANDAGES/DRESSINGS) ×2 IMPLANT
SUT PLAIN 0 NONE (SUTURE) IMPLANT
SUT PLAIN 2 0 XLH (SUTURE) IMPLANT
SUT VIC AB 0 CT1 27 (SUTURE) ×6
SUT VIC AB 0 CT1 27XBRD ANBCTR (SUTURE) ×2 IMPLANT
SUT VIC AB 0 CTX 36 (SUTURE) ×9
SUT VIC AB 0 CTX36XBRD ANBCTRL (SUTURE) ×3 IMPLANT
SUT VIC AB 2-0 CT1 27 (SUTURE) ×3
SUT VIC AB 2-0 CT1 TAPERPNT 27 (SUTURE) ×1 IMPLANT
SUT VIC AB 4-0 KS 27 (SUTURE) ×3 IMPLANT
TOWEL OR 17X24 6PK STRL BLUE (TOWEL DISPOSABLE) ×3 IMPLANT
TRAY FOLEY W/BAG SLVR 14FR LF (SET/KITS/TRAYS/PACK) IMPLANT
WATER STERILE IRR 1000ML POUR (IV SOLUTION) ×3 IMPLANT

## 2021-02-27 NOTE — Discharge Summary (Signed)
Postpartum Discharge Summary     Patient Name: Angel Moore DOB: [birth year] MRN: [id number] Primary OBG[facility]: [facility]  Date of admission: 02/27/2021 Delivery date:02/27/2021  Delivering provider: Janyth Pupa  Date of discharge: 03/01/2021  Admitting diagnosis: Pregnancy at 38/6. IOL for severe pre-eclampisa (BPs) superimposed on chronic HTN. Desire for repeat c-section. GDMa1  Discharge diagnosis: Delivered                                      Post partum procedures: Mg Augmentation: N/A Complications: None  Hospital course: She underwent an uncomplicated rpt c/s. Her perioperative Mg was continued for 24 hours and she had an uncomplicated postop and postpartum course. Her BP meds were slowly titrated. She is ambulating,tolerating a regular diet, passing flatus, and urinating well.  Patient is discharged home in stable condition 03/01/21.  Newborn Data: Birth date:02/27/2021  Birth time:6:08 PM  Gender:Female  Living status:Living  Apgars:9 ,9  Weight:3540 g   Magnesium Sulfate received: Yes: Seizure prophylaxis BMZ received: Yes Rhophylac:N/A MMR:N/A  Physical exam  Vitals:   02/28/21 1958 02/28/21 2309 03/01/21 0317 03/01/21 0650  BP: (!) 152/99 (!) 142/96 (!) 143/98 (!) 141/88  Pulse: 86 81 87 82  Resp: $Remo'18 20 18 16  'QTRCb$ Temp: 98.4 F (36.9 C) 98.3 F (36.8 C) 97.8 F (36.6 C) 98.5 F (36.9 C)  TempSrc: Oral Oral Oral Oral  SpO2: 100% 100% 100% 99%  Weight:      Height:       General: alert Lochia: appropriate Uterine Fundus: nttp. Abdomen, soft, mild to moderately distended, +BS Incision: c/d/I honeycomb DVT Evaluation: No evidence of DVT seen on physical exam. Labs: Lab Results  Component Value Date   WBC 24.9 (H) 02/28/2021   HGB 10.0 (L) 02/28/2021   HCT 31.1 (L) 02/28/2021   MCV 77.2 (L) 02/28/2021   PLT 255 02/28/2021   CMP Latest Ref Rng & Units 02/27/2021  Glucose 70 - 99 mg/dL 82  BUN 6 - 20 mg/dL 10  Creatinine 0.44 - 1.00 mg/dL  0.71  Sodium 135 - 145 mmol/L 135  Potassium 3.5 - 5.1 mmol/L 4.0  Chloride 98 - 111 mmol/L 107  CO2 22 - 32 mmol/L 21(L)  Calcium 8.9 - 10.3 mg/dL 8.9  Total Protein 6.5 - 8.1 g/dL 6.3(L)  Total Bilirubin 0.3 - 1.2 mg/dL 1.0  Alkaline Phos 38 - 126 U/L 169(H)  AST 15 - 41 U/L 27  ALT 0 - 44 U/L 24   Edinburgh Score: Edinburgh Postnatal Depression Scale Screening Tool 02/28/2021  I have been able to laugh and see the funny side of things. (No Data)  I have looked forward with enjoyment to things. -  I have blamed myself unnecessarily when things went wrong. -  I have been anxious or worried for no good reason. -  I have felt scared or panicky for no good reason. -  Things have been getting on top of me. -  I have been so unhappy that I have had difficulty sleeping. -  I have felt sad or miserable. -  I have been so unhappy that I have been crying. -  The thought of harming myself has occurred to me. Flavia Shipper Postnatal Depression Scale Total -     After visit meds:  Allergies as of 03/01/2021   No Known Allergies      Medication List  STOP taking these medications    Accu-Chek Guide test strip Generic drug: glucose blood   aspirin 81 MG EC tablet   Dexcom G6 Receiver Devi   ferrous sulfate 325 (65 FE) MG tablet   labetalol 200 MG tablet Commonly known as: NORMODYNE       TAKE these medications    furosemide 20 MG tablet Commonly known as: LASIX Take 1 tablet (20 mg total) by mouth 2 (two) times daily for 3 days.   ibuprofen 600 MG tablet Commonly known as: ADVIL Take 1 tablet (600 mg total) by mouth every 6 (six) hours as needed for fever, headache, mild pain, moderate pain or cramping.   NIFEdipine 30 MG 24 hr tablet Commonly known as: ADALAT CC Take 1 tablet (30 mg total) by mouth 2 (two) times daily.   oxyCODONE-acetaminophen 5-325 MG tablet Commonly known as: PERCOCET/ROXICET Take 1-2 tablets by mouth every 6 (six) hours as needed.    polyethylene glycol 17 g packet Commonly known as: MIRALAX / GLYCOLAX Take 17 g by mouth daily.   prenatal vitamin w/FE, FA 27-1 MG Tabs tablet Take 1 tablet by mouth daily at 12 noon.   simethicone 80 MG chewable tablet Commonly known as: MYLICON Chew 1 tablet (80 mg total) by mouth 4 (four) times daily as needed for flatulence.         Discharge home in stable condition Infant Feeding: Bottle Infant Disposition:home with mother Discharge instruction: per After Visit Summary and Postpartum booklet. Activity: Advance as tolerated. Pelvic rest for 6 weeks.  Diet: carb modified diet Future Appointments:No future appointments. Follow up Visit:  Follow-up Information     [facility]. Call in 5 day(s).   Specialty: Obstetrics and Gynecology Why: call the office tuesday morning if you haven't heard about your blood pressure and incision check visit for later this upcoming week. Contact information: [address] [place] [phone]               Message sent to [facility] by Dr. Cy Blamer on 12/16  Please schedule this patient for a In person postpartum visit in 6 weeks with the following provider: Any provider. Additional Postpartum F/U:Incision check 1 week and 1 week BP check and 6 wk GTT High risk pregnancy complicated by:  cHTN with superimposed pre-Eclampsia Delivery mode:  C-Section, Low Transverse  Anticipated Birth Control:   BTL (already done 4 days post conception of current pregnancy)   03/01/2021 Aletha Halim, MD

## 2021-02-27 NOTE — MAU Note (Signed)
...  Angel Moore is a 40 y.o. at [redacted]w[redacted]d here in MAU reporting: sent here from [facility] for lab work and monitoring for elevated BP's. Patient denies HA, RUQ pain, visual disturbances, and edema. +FM. No VB or LOF.  Last took 300 mg of Labetalol around 0900 this morning.  Pain score: Denies pain. Lab orders placed from triage: UA

## 2021-02-27 NOTE — Anesthesia Procedure Notes (Signed)
Spinal  Patient location during procedure: OR Start time: 02/27/2021 5:38 PM End time: 02/27/2021 5:41 PM Reason for block: surgical anesthesia Staffing Performed: anesthesiologist  Anesthesiologist: Kaylyn Layer, MD Preanesthetic Checklist Completed: patient identified, IV checked, risks and benefits discussed, monitors and equipment checked, pre-op evaluation and timeout performed Spinal Block Patient position: sitting Prep: DuraPrep and site prepped and draped Patient monitoring: heart rate, continuous pulse ox and blood pressure Approach: midline Location: L3-4 Injection technique: single-shot Needle Needle type: Pencan  Needle gauge: 24 G Needle length: 10 cm Assessment Sensory level: T4 Events: CSF return Additional Notes Risks, benefits, and alternative discussed. Patient gave consent to procedure. Prepped and draped in sitting position. Clear CSF obtained after one needle pass. Positive terminal aspiration. No pain or paraesthesias with injection. Patient tolerated procedure well. Vital signs stable. Angel Greenhouse, MD

## 2021-02-27 NOTE — Progress Notes (Addendum)
° °  NURSE VISIT- BLOOD PRESSURE CHECK  SUBJECTIVE:  Angel Moore is a 40 y.o. [phone] female here for BP check. She is [redacted]w[redacted]d pregnant    HYPERTENSION ROS:  Pregnant:  Severe headaches that don't go away with tylenol/other medicines: No  Visual changes (seeing spots/double/blurred vision) No  Severe pain under right breast breast or in center of upper chest No  Severe nausea/vomiting No  Taking medicines as instructed yes   OBJECTIVE:  BP (!) 155/104 (BP Location: Right Arm, Patient Position: Sitting, Cuff Size: Normal)    Pulse 81    LMP 05/31/2020 (Exact Date)   Appearance alert, well appearing, and in no distress and oriented to person, place, and time.  ASSESSMENT: Pregnancy [redacted]w[redacted]d  blood pressure check  PLAN: Discussed with Dr. Charlotta Newton   Recommendations: no changes needed   Follow-up:  Sent to [facility] for BP monitoring and possible delivery.     Jobe Marker  02/27/2021 12:49 PM  Chart reviewed for nurse visit. Agree with plan of care.  Myna Hidalgo, DO 03/07/2021 11:55 AM

## 2021-02-27 NOTE — Anesthesia Preprocedure Evaluation (Addendum)
Anesthesia Evaluation  Patient identified by MRN, date of birth, ID band Patient awake    Reviewed: Allergy & Precautions, Patient's Chart, lab work & pertinent test results, reviewed documented beta blocker date and time   History of Anesthesia Complications Negative for: history of anesthetic complications  Airway Mallampati: II  TM Distance: >3 FB Neck ROM: Full    Dental no notable dental hx.    Pulmonary Current Smoker and Patient abstained from smoking.,    Pulmonary exam normal        Cardiovascular hypertension, Pt. on home beta blockers Normal cardiovascular exam     Neuro/Psych negative neurological ROS  negative psych ROS   GI/Hepatic negative GI ROS, Neg liver ROS,   Endo/Other  diabetes, GestationalMorbid obesity  Renal/GU negative Renal ROS  negative genitourinary   Musculoskeletal negative musculoskeletal ROS (+)   Abdominal   Peds  Hematology negative hematology ROS (+)   Anesthesia Other Findings Day of surgery medications reviewed with patient.  Reproductive/Obstetrics (+) Pregnancy (severe preE on Mg, hx of C/S x2)                            Anesthesia Physical Anesthesia Plan  ASA: 3  Anesthesia Plan: Spinal   Post-op Pain Management:    Induction:   PONV Risk Score and Plan: 4 or greater and Treatment may vary due to age or medical condition, Ondansetron and Dexamethasone  Airway Management Planned: Natural Airway  Additional Equipment:   Intra-op Plan:   Post-operative Plan:   Informed Consent: I have reviewed the patients History and Physical, chart, labs and discussed the procedure including the risks, benefits and alternatives for the proposed anesthesia with the patient or authorized representative who has indicated his/her understanding and acceptance.       Plan Discussed with: CRNA  Anesthesia Plan Comments: (Ate breakfast at 0930 today.  Will proceed at or after 1730 unless urgent indication arises. Stephannie Peters, MD)       Anesthesia Quick Evaluation

## 2021-02-27 NOTE — Progress Notes (Signed)
Reviewed care plan with Angel Moore.  Due to NPO status, waiting until ~ 1730.  Risk/benefit reviewed with patient, inform consent previously signed.  Questions and concerns were addressed.  Plan to proceed with repeat C-section as discussed.  Angel Hidalgo, DO Attending Obstetrician & Gynecologist, [facility] for [employer], [facility]

## 2021-02-27 NOTE — Op Note (Signed)
Operative Note   Patient: Angel Moore  Date of Procedure: 02/27/2021  Procedure: Repeat Low Transverse Cesarean   Indications:  repeat cesarean  Pre-operative Diagnosis: PREVIOUS CESEREAN X2.   Post-operative Diagnosis: Same  TOLAC Candidate: No  Surgeon: Surgeon(s) and Role:    * Myna Hidalgo, DO - Primary    * Warner Mccreedy, MD - Assisting   An experienced assistant was required given the standard of surgical care given the complexity of the case.  This assistant was needed for exposure, dissection, suctioning, retraction, instrument exchange, assisting with delivery with administration of fundal pressure, and for overall help during the procedure.   Anesthesia: spinal  Anesthesiologist: Bethena Midget, MD   Antibiotics:  Cefotetan and Azithromycin   Estimated Blood Loss: 500 ml   Total IV Fluids: 500 ml  Urine Output:  400 cc OF clear urine  Specimens: None   Complications: no complications   Indications: Angel Moore is a 40 y.o. [phone] with an IUP [redacted]w[redacted]d presenting for unscheduled cesarean secondary to the indications listed above. Clinical course notable for cHTN with superimposed severe pre-eclampsia (BP).   Findings: Viable infant in cephalic presentation, no nuchal cord present. Apgars 9 , 9 , . Weight 3540 g . Clear amniotic fluid. Normal placenta, three vessel cord. Normal uterus, bilateral fallopian tube remnants but no tubes not in place as expected (since history of prior tubal), Normal bilateral ovaries.  Procedure Details: A Time Out was held and the above information confirmed. The patient received intravenous antibiotics and had sequential compression devices applied to her lower extremities preoperatively. The patient was taken back to the operative suite where spinal anesthesia was administered. After induction of anesthesia, the patient was draped and prepped in the usual sterile manner and placed in a dorsal supine position with a leftward tilt.  A low transverse skin incision was made with scalpel and carried down through the subcutaneous tissue to the fascia. Fascial incision was made and extended transversely. The fascia was separated from the underlying rectus tissue superiorly and inferiorly. The rectus muscles were separated in the midline bluntly and the peritoneum was entered bluntly. An Alexis retractor was placed to aid in visualization of the uterus. The utero-vesical peritoneal reflection was incised transversely and the bladder flap was bluntly freed from the lower uterine segment. A low transverse uterine incision was made. The infant was successfully delivered from cephalic presentation, the umbilical cord was clamped after 1 minute. Cord ph was not sent, and cord blood was obtained for evaluation. The placenta was removed Intact and appeared normal. The uterine incision was closed with running locked sutures of 0-vicryl, and then a second imbricating layer was also placed with 0-Vicryl. Overall, excellent hemostasis was noted. The abdomen and the pelvis were cleared of all clot and debris and the Jon Gills was removed. Hemostasis was confirmed on all surfaces.  The peritoneum was not reapproximated. The fascia was then closed using 0 Vicryl in a running fashion. The subcutaneous layer was reapproximated with plain gut and the skin was closed with a 4-0 vicryl subcuticular stitch. The patient tolerated the procedure well. Sponge, lap, instrument and needle counts were correct x 2. She was taken to the recovery room in stable condition.  Disposition: PACU - hemodynamically stable.    Signed: Warner Mccreedy, MD, MPH [facility] for [facility])

## 2021-02-27 NOTE — Transfer of Care (Signed)
Immediate Anesthesia Transfer of Care Note  Patient: Angel Moore  Procedure(s) Performed: CESAREAN SECTION (Abdomen)  Patient Location: PACU  Anesthesia Type:Spinal  Level of Consciousness: awake  Airway & Oxygen Therapy: Patient Spontanous Breathing  Post-op Assessment: Report given to RN and Post -op Vital signs reviewed and stable  Post vital signs: Reviewed and stable  Last Vitals:  Vitals Value Taken Time  BP 124/82 02/27/21 1900  Temp 36.5 C 02/27/21 1855  Pulse 77 02/27/21 1900  Resp 20 02/27/21 1900  SpO2 100 % 02/27/21 1900  Vitals shown include unvalidated device data.  Last Pain:  Vitals:   02/27/21 1855  TempSrc: Oral         Complications: No notable events documented.

## 2021-02-27 NOTE — H&P (Signed)
Obstetric Preoperative History and Physical  Angel Moore is a 40 y.o. [id number] with IUP at [redacted]w[redacted]d with chronic hypertension with exacerbation to severe range BPs concerning for possible severe preeclampsia, being admitted for repeat cesarean section.  Was scheduled for RCS on 03/01/21 but was noted to have BP 150/100s in the office at [facility] 02/16/14/22, remained the same today despite taking her prescribed Labetalol 300 mg po tid.  She was sent to MAU to be evaluated and also to be prepared for delivery. On evaluation, patient denies any headaches, visual symptoms, RUQ/epigastric pain or other concerning symptoms. Her BP was unfortunately noted to be 172/119, with repeat 169/106.  Labetalol IV protocol initiated, also started on magnesium sulfate for eclampsia prophylaxis.  This helped in lowering her BP. Reassuring FHT tracing.  She reports good fetal movement, no bleeding, no contractions, but possible leaking of fluid. Exam in MAU was equivocal for rupture, patient's cervical exam was 3/50/-3/vertex. Contractions are irregular, about 4-6 minutes, mild intensity.  She last ate around 0930.  No acute preoperative concerns.    Cesarean Section Indication:  Previous cesarean section x 2; exacerbation of CHTN with severe range BP, possible severe preeclampsia  Prenatal Course [facility] of Care: [facility] with onset of care at 84 weeks Pregnancy complications or risks: Patient Active Problem List   Diagnosis Date Noted   Chronic hypertension with exacerbation during pregnancy in third trimester 02/27/2021   Gestational diabetes mellitus, class A1 12/22/2020   Supervision of high-risk pregnancy 12/01/2020   Encounter for sterilization    Sickle cell trait ([place]) 03/26/2019   Smoker 03/20/2019   History of 2 cesarean sections 03/20/2019   Chronic hypertension affecting pregnancy    Iron deficiency anemia due to chronic blood loss 01/26/2018    [facility]  RESULTS  Language English Pap 12/01/20: neg   Initiated care at 26wks GC/CT Initial:  -/-          36wks:  Dating by LMP c/w 24wk u/s    Support person  Genetics NT/IT/AFP: too late   Panorama: low risk NIPS   BP cuff Has at home [place] vaccine  Blood Type --/--/O POS (12/16 1523)  Flu vaccine  Antibody NEG (12/16 1523)  Covid vaccine  HBsAg Negative (09/20 0817)    RPR Non Reactive (09/20 0817)  Anatomy US Normal Rubella  1.83 (09/20 0817)  Feeding Plan Formula HIV Non Reactive (09/20 0817)  Contraception Already had salpingectomy 06/18/20 Hep C neg  Circumcision N/A - girl A1C/GTT 26-28wks: 74/197/97  Pediatrician  GBS Positive/-- (11/29 1400)      Prenatal Transfer Tool  Maternal Diabetes: No Genetic Screening: Normal Maternal Ultrasounds/Referrals: Normal Fetal Ultrasounds or other Referrals:  None Maternal Substance Abuse:  No Significant Maternal Medications:  Meds include: Other:  Labetalol Significant Maternal Lab Results: Group B Strep positive  Past Medical History:  Diagnosis Date   Anemia    Constipation 01/26/2018   Dyspepsia 01/26/2018   Heart murmur    Hypertension    Stress headaches    Tobacco abuse    1 PK/WEEK    Past Surgical History:  Procedure Laterality Date   BIOPSY  01/31/2018   Procedure: BIOPSY;  Surgeon: Danie Binder, MD;  Location: [facility];  Service: Endoscopy;;  Gastric   CESAREAN SECTION  08/2007   CESAREAN SECTION N/A 08/28/2019   Procedure: CESAREAN SECTION;  Surgeon: Truett Mainland,  DO;  Location: [facility] LD ORS;  Service: Obstetrics;  Laterality: N/A;   CHOLECYSTECTOMY, LAPAROSCOPIC  2006   ESOPHAGOGASTRODUODENOSCOPY (EGD) WITH PROPOFOL N/A 01/31/2018   Procedure: ESOPHAGOGASTRODUODENOSCOPY (EGD) WITH PROPOFOL;  Surgeon: West Bali, MD;  Location: [facility];  Service: Endoscopy;  Laterality: N/A;  7:30am   LAPAROSCOPIC BILATERAL SALPINGECTOMY Bilateral 06/18/2020   Procedure: LAPAROSCOPIC BILATERAL SALPINGECTOMY;  Surgeon:  Lazaro Arms, MD;  Location: [facility] ORS;  Service: Gynecology;  Laterality: Bilateral;    OB History  Gravida Para Term Preterm AB Living  4 2 2   1 2   SAB IAB Ectopic Multiple Live Births  1     0 2    # Outcome Date GA Lbr Len/2nd Weight Sex Delivery Anes PTL Lv  4 Current           3 Term 08/28/19 [redacted]w[redacted]d  2880 g F CS-LTranv Spinal  LIV  2 Term 08/23/07 [redacted]w[redacted]d  3062 g F CS-LTranv EPI N LIV     Complications: Failure to Progress in First Stage, Pre-eclampsia  1 SAB             Social History   Socioeconomic History   Marital status: Single    Spouse name: Not on file   Number of children: 2   Years of education: Not on file   Highest education level: High school graduate  Occupational History   Not on file  Tobacco Use   Smoking status: Every Day    Packs/day: 0.25    Types: Cigarettes   Smokeless tobacco: Never  Vaping Use   Vaping Use: Never used  Substance and Sexual Activity   Alcohol use: Yes    Comment: occasionally   Drug use: Not Currently   Sexual activity: Yes    Birth control/protection: Surgical    Comment: tubal  Other Topics Concern   Not on file  Social History Narrative   DOESN'T WORK.ON SSI FOR LEARNING CHALLENGED. ONE DAUGHTER, SINGLE. LIVES WITH GRANDFATHER(AGE 74)   Social Determinants of Health   Financial Resource Strain: Not on file  Food Insecurity: Not on file  Transportation Needs: Not on file  Physical Activity: Not on file  Stress: Not on file  Social Connections: Not on file    Family History  Problem Relation Age of Onset   Diabetes Brother    Diabetes Maternal Grandfather    Colon cancer Neg Hx    Colon polyps Neg Hx     Medications Prior to Admission  Medication Sig Dispense Refill Last Dose   aspirin 81 MG EC tablet Take 2 tablets (162 mg total) by mouth daily. Swallow whole. 180 tablet 2 02/27/2021   ferrous sulfate 325 (65 FE) MG tablet Take 1 tablet (325 mg total) by mouth every other day. 45 tablet 2 02/27/2021    prenatal vitamin w/FE, FA (PRENATAL 1 + 1) 27-1 MG TABS tablet Take 1 tablet by mouth daily at 12 noon. 90 tablet 3 02/27/2021   Continuous Blood Gluc Receiver (DEXCOM G6 RECEIVER) DEVI 1 Device by Does not apply route as directed. 1 each 0    glucose blood (ACCU-CHEK GUIDE) test strip Check blood sugars four times a day 50 each 12    labetalol (NORMODYNE) 200 MG tablet Take 1 tablet (200 mg total) by mouth 2 (two) times daily. (Patient taking differently: Take 200 mg by mouth 3 (three) times daily.) 60 tablet 3     No Known Allergies  Review of Systems: Pertinent items  noted in HPI and remainder of comprehensive ROS otherwise negative.  Physical Exam: BP (!) 148/103    Pulse 81    Temp 98.4 F (36.9 C) (Tympanic)    Resp 15    Ht 5\' 3"  (1.6 m)    Wt 103.6 kg    LMP 05/31/2020 (Exact Date)    SpO2 98%    BMI 40.46 kg/m   Patient Vitals for the past 24 hrs:  BP Temp Temp src Pulse Resp SpO2 Height Weight  02/27/21 1631 (!) 148/103 -- -- 81 -- -- -- --  02/27/21 1626 -- -- -- -- -- -- 5\' 3"  (1.6 m) 103.6 kg  02/27/21 1604 (!) 151/97 -- -- 83 15 98 % -- --  02/27/21 1551 (!) 150/103 -- -- 87 18 98 % -- --  02/27/21 1541 (!) 160/109 -- -- 84 19 99 % -- --  02/27/21 1531 (!) 169/106 -- -- 86 -- -- -- --  02/27/21 1514 (!) 172/119 98.4 F (36.9 C) Tympanic 84 15 -- -- --   FHR tracing:  140s, +mod variability, small accels, no decels  CONSTITUTIONAL: Well-developed, well-nourished female in no acute distress.  HENT:  Normocephalic, atraumatic, External right and left ear normal. Oropharynx is clear and moist EYES: Conjunctivae and EOM are normal. Pupils are equal, round, and reactive to light. No scleral icterus.  NECK: Normal range of motion, supple, no masses SKIN: Skin is warm and dry. No rash noted. Not diaphoretic. No erythema. No pallor. NEUROLOGIC: Alert and oriented to person, place, and time. Normal reflexes, muscle tone coordination. No cranial nerve deficit noted. PSYCHIATRIC:  Normal mood and affect. Normal behavior. Normal judgment and thought content. CARDIOVASCULAR: Normal heart rate noted, regular rhythm RESPIRATORY: Effort and breath sounds normal, no problems with respiration noted ABDOMEN: Soft, nontender, nondistended, gravid. Well-healed Pfannenstiel incision. PELVIC: Dilation: 3 Effacement (%): 50 Cervical Position: Posterior Station: -3 Presentation: Vertex Exam by:: Holly Flippin RN MUSCULOSKELETAL: Normal range of motion. No edema and no tenderness. 2+ distal pulses.  Pertinent Labs/Studies:   Results for orders placed or performed during the hospital encounter of 02/27/21 (from the past 72 hour(s))  CBC     Status: Abnormal   Collection Time: 02/27/21  3:23 PM  Result Value Ref Range   WBC 12.5 (H) 4.0 - 10.5 K/uL   RBC 4.72 3.87 - 5.11 MIL/uL   Hemoglobin 11.4 (L) 12.0 - 15.0 g/dL   HCT 36.6 36.0 - 46.0 %   MCV 77.5 (L) 80.0 - 100.0 fL   MCH 24.2 (L) 26.0 - 34.0 pg   MCHC 31.1 30.0 - 36.0 g/dL   RDW 29.2 (H) 11.5 - 15.5 %   Platelets 281 150 - 400 K/uL    Comment: REPEATED TO VERIFY   nRBC 0.0 0.0 - 0.2 %    Comment: Performed at [facility], [place] [address] [place], [place] [address]  Comprehensive metabolic panel     Status: Abnormal   Collection Time: 02/27/21  3:23 PM  Result Value Ref Range   Sodium 135 135 - 145 mmol/L   Potassium 4.0 3.5 - 5.1 mmol/L   Chloride 107 98 - 111 mmol/L   CO2 21 (L) 22 - 32 mmol/L   Glucose, Bld 82 70 - 99 mg/dL    Comment: Glucose reference range applies only to samples taken after fasting for at least 8 hours.   BUN 10 6 - 20 mg/dL   Creatinine, Ser 0.71 0.44 - 1.00 mg/dL  Calcium 8.9 8.9 - 10.3 mg/dL   Total Protein 6.3 (L) 6.5 - 8.1 g/dL   Albumin 2.7 (L) 3.5 - 5.0 g/dL   AST 27 15 - 41 U/L   ALT 24 0 - 44 U/L   Alkaline Phosphatase 169 (H) 38 - 126 U/L   Total Bilirubin 1.0 0.3 - 1.2 mg/dL   GFR, Estimated >60 >60 mL/min    Comment: (NOTE) Calculated using the CKD-EPI Creatinine  Equation (2021)    Anion gap 7 5 - 15    Comment: Performed at [facility] [address] [place], [place] [address]  Type and screen [place]     Status: None   Collection Time: 02/27/21  3:23 PM  Result Value Ref Range   ABO/RH(D) O POS    Antibody Screen NEG    Sample Expiration      03/02/2021,2359 Performed at [facility], [place] [address], [place], [place] [address]   Resp Panel by RT-PCR (Flu A&B, Covid) Nasopharyngeal Swab     Status: None   Collection Time: 02/27/21  3:23 PM   Specimen: Nasopharyngeal Swab; Nasopharyngeal(NP) swabs in vial transport medium  Result Value Ref Range   SARS Coronavirus 2 by RT PCR NEGATIVE NEGATIVE    Comment: (NOTE) SARS-CoV-2 target nucleic acids are NOT DETECTED.  The SARS-CoV-2 RNA is generally detectable in upper respiratory specimens during the acute phase of infection. The lowest concentration of SARS-CoV-2 viral copies this assay can detect is 138 copies/mL. A negative result does not preclude SARS-Cov-2 infection and should not be used as the sole basis for treatment or other patient management decisions. A negative result may occur with  improper specimen collection/handling, submission of specimen other than nasopharyngeal swab, presence of viral mutation(s) within the areas targeted by this assay, and inadequate number of viral copies(<138 copies/mL). A negative result must be combined with clinical observations, patient history, and epidemiological information. The expected result is Negative.  Fact Sheet for Patients:  EntrepreneurPulse.com.au  Fact Sheet for Healthcare Providers:  IncredibleEmployment.be  This test is no t yet approved or cleared by the [place] FDA and  has been authorized for detection and/or diagnosis of SARS-CoV-2 by FDA under an Emergency Use Authorization (EUA). This EUA will remain  in effect (meaning this test can be used)  for the duration of the COVID-19 declaration under Section 564(b)(1) of the Act, 21 U.S.C.section 360bbb-3(b)(1), unless the authorization is terminated  or revoked sooner.       Influenza A by PCR NEGATIVE NEGATIVE   Influenza B by PCR NEGATIVE NEGATIVE    Comment: (NOTE) The Xpert Xpress SARS-CoV-2/FLU/RSV plus assay is intended as an aid in the diagnosis of influenza from Nasopharyngeal swab specimens and should not be used as a sole basis for treatment. Nasal washings and aspirates are unacceptable for Xpert Xpress SARS-CoV-2/FLU/RSV testing.  Fact Sheet for Patients: EntrepreneurPulse.com.au  Fact Sheet for Healthcare Providers: IncredibleEmployment.be  This test is not yet approved or cleared by the [place] FDA and has been authorized for detection and/or diagnosis of SARS-CoV-2 by FDA under an Emergency Use Authorization (EUA). This EUA will remain in effect (meaning this test can be used) for the duration of the COVID-19 declaration under Section 564(b)(1) of the Act, 21 U.S.C. section 360bbb-3(b)(1), unless the authorization is terminated or revoked.  Performed at [facility], [place] [address], [place], [place] [address]   Wet prep, genital     Status:  Abnormal   Collection Time: 02/27/21  4:05 PM  Result Value Ref Range   Yeast Wet Prep HPF POC NONE SEEN NONE SEEN   Trich, Wet Prep NONE SEEN NONE SEEN   Clue Cells Wet Prep HPF POC NONE SEEN NONE SEEN   WBC, Wet Prep HPF POC >=10 (A) <10   Sperm NONE SEEN     Comment: Performed at [facility], [place] [address], [place], [place] [address]    Assessment and Plan: Angel Moore is a 40 y.o. [id number] at [redacted]w[redacted]d being admitted for cesarean section. The risks of surgery were discussed with the patient including but were not limited to: bleeding which may require transfusion or reoperation; infection which may require antibiotics; injury to bowel, bladder, ureters or other  surrounding organs; injury to the fetus; need for additional procedures including hysterectomy in the event of a life-threatening hemorrhage; formation of adhesions; placental abnormalities wth subsequent pregnancies; incisional problems; thromboembolic phenomenon and other postoperative/anesthesia complications. The patient concurred with the proposed plan, giving informed written consent for the procedure. Patient has been NPO since 0930 she will remain NPO for procedure which will be around 1730 or earlier if needed. Anesthesia and OR aware. Preoperative prophylactic antibiotics (Cefotetan and Azithromycin for equivocal ROM) and SCDs ordered on call to the OR.   Will continue Labetalol IV as needed and magnesium sulfate for eclampsia prophylaxis.  She may meet criteria for superimposed severe preeclampsia which does not alter current management, Urine Pr:Cr pending but normal CBC and CMET. Will follow up this lab and other pending labs.   To OR when ready.     Verita Schneiders, MD, [place] for [employer], [place]

## 2021-02-28 ENCOUNTER — Encounter (HOSPITAL_COMMUNITY): Payer: Self-pay | Admitting: Obstetrics & Gynecology

## 2021-02-28 LAB — RPR: RPR Ser Ql: NONREACTIVE

## 2021-02-28 LAB — GLUCOSE, CAPILLARY
Glucose-Capillary: 126 mg/dL — ABNORMAL HIGH (ref 70–99)
Glucose-Capillary: 127 mg/dL — ABNORMAL HIGH (ref 70–99)

## 2021-02-28 LAB — CBC
HCT: 31.1 % — ABNORMAL LOW (ref 36.0–46.0)
Hemoglobin: 10 g/dL — ABNORMAL LOW (ref 12.0–15.0)
MCH: 24.8 pg — ABNORMAL LOW (ref 26.0–34.0)
MCHC: 32.2 g/dL (ref 30.0–36.0)
MCV: 77.2 fL — ABNORMAL LOW (ref 80.0–100.0)
Platelets: 255 10*3/uL (ref 150–400)
RBC: 4.03 MIL/uL (ref 3.87–5.11)
RDW: 29 % — ABNORMAL HIGH (ref 11.5–15.5)
WBC: 24.9 10*3/uL — ABNORMAL HIGH (ref 4.0–10.5)
nRBC: 0 % (ref 0.0–0.2)

## 2021-02-28 MED ORDER — NIFEDIPINE ER OSMOTIC RELEASE 30 MG PO TB24
30.0000 mg | ORAL_TABLET | Freq: Two times a day (BID) | ORAL | Status: DC
  Administered 2021-02-28 – 2021-03-01 (×2): 30 mg via ORAL
  Filled 2021-02-28 (×2): qty 1

## 2021-02-28 MED ORDER — FERROUS SULFATE 325 (65 FE) MG PO TABS
325.0000 mg | ORAL_TABLET | ORAL | Status: DC
  Administered 2021-02-28 – 2021-03-01 (×2): 325 mg via ORAL
  Filled 2021-02-28 (×2): qty 1

## 2021-02-28 NOTE — Progress Notes (Signed)
Notified Dr Vergie Living at 2005 of patient's upward trending blood pressure. Pulse within normal limits. Denies headache,visual disturbances,or RUQ pain. DTR's normal.Order to change Procardia 30 mg  to twice a day given and to give dose now. Patient informed of the same.

## 2021-02-28 NOTE — Plan of Care (Signed)
°  Problem: Education: Goal: Knowledge of General Education information will improve Description: Including pain rating scale, medication(s)/side effects and non-pharmacologic comfort measures Outcome: Progressing   Problem: Health Behavior/Discharge Planning: Goal: Ability to manage health-related needs will improve Outcome: Progressing   Problem: Clinical Measurements: Goal: Ability to maintain clinical measurements within normal limits will improve Outcome: Progressing Goal: Will remain free from infection Outcome: Progressing Goal: Diagnostic test results will improve Outcome: Progressing Goal: Respiratory complications will improve Outcome: Progressing Goal: Cardiovascular complication will be avoided Outcome: Progressing   Problem: Activity: Goal: Risk for activity intolerance will decrease Outcome: Progressing   Problem: Nutrition: Goal: Adequate nutrition will be maintained Outcome: Progressing   Problem: Coping: Goal: Level of anxiety will decrease Outcome: Progressing   Problem: Elimination: Goal: Will not experience complications related to bowel motility Outcome: Progressing Goal: Will not experience complications related to urinary retention Outcome: Progressing   Problem: Pain Managment: Goal: General experience of comfort will improve Outcome: Progressing   Problem: Safety: Goal: Ability to remain free from injury will improve Outcome: Progressing   Problem: Skin Integrity: Goal: Risk for impaired skin integrity will decrease Outcome: Progressing   Problem: Education: Goal: Knowledge of disease or condition will improve Outcome: Progressing Goal: Knowledge of the prescribed therapeutic regimen will improve Outcome: Progressing   Problem: Fluid Volume: Goal: Peripheral tissue perfusion will improve Outcome: Progressing   Problem: Clinical Measurements: Goal: Complications related to disease process, condition or treatment will be avoided or  minimized Outcome: Progressing   Problem: Education: Goal: Knowledge of condition will improve Outcome: Progressing Goal: Individualized Newborn Educational Video(s) Outcome: Progressing   Problem: Activity: Goal: Will verbalize the importance of balancing activity with adequate rest periods Outcome: Progressing Goal: Ability to tolerate increased activity will improve Outcome: Progressing   Problem: Coping: Goal: Ability to identify and utilize available resources and services will improve Outcome: Progressing   Problem: Life Cycle: Goal: Chance of risk for complications during the postpartum period will decrease Outcome: Progressing   Problem: Role Relationship: Goal: Ability to demonstrate positive interaction with newborn will improve Outcome: Progressing   Problem: Skin Integrity: Goal: Demonstration of wound healing without infection will improve Outcome: Progressing

## 2021-02-28 NOTE — Progress Notes (Signed)
Postpartum Day 1: Cesarean Delivery for CHTN with SI SPEC at [redacted]w[redacted]d  Subjective: Patient reports incisional pain controlled on medications.  Patient denies any headaches, visual symptoms, RUQ/epigastric pain or other concerning symptoms.  No flatus yet.  Bottlefeeding. Baby doing well at bedside.  Objective: Vital signs in last 24 hours: Temp:  [97.7 F (36.5 C)-98.6 F (37 C)] 98.2 F (36.8 C) (12/17 0802) Pulse Rate:  [74-91] 82 (12/17 0802) Resp:  [10-22] 17 (12/17 0802) BP: (119-172)/(77-119) 136/86 (12/17 0802) SpO2:  [98 %-100 %] 100 % (12/17 0802) Weight:  [103.6 kg] 103.6 kg (12/16 1626)  Physical Exam:  General: alert and no distress Lochia: appropriate Uterine Fundus: firm, NT Incision: healing well, no significant drainage, dressing C/D/I DVT Evaluation: No evidence of DVT seen on physical exam. Negative Homan's sign. No cords or calf tenderness. No significant calf/ankle edema.  Recent Labs    02/27/21 1523 02/28/21 0446  HGB 11.4* 10.0*  HCT 36.6 31.1*    Assessment/Plan: Status post Cesarean section. Doing well postoperatively.  Magnesium sulfate to be discontinued around 1600 Stable BP, continue with Procardia and titrate as needed Oral iron for mild, asymptomatic acute blood loss anemia. Continue with routine postpartum care.    Jaynie Collins, MD 02/28/2021, 9:14 AM

## 2021-03-01 MED ORDER — POLYETHYLENE GLYCOL 3350 17 G PO PACK: 17.0000 g | PACK | Freq: Every day | ORAL | 1 refills | Status: AC

## 2021-03-01 MED ORDER — SIMETHICONE 80 MG PO CHEW: 80.0000 mg | CHEWABLE_TABLET | Freq: Four times a day (QID) | ORAL | 0 refills | Status: AC | PRN

## 2021-03-01 MED ORDER — NIFEDIPINE ER 30 MG PO TB24: 30.0000 mg | ORAL_TABLET | Freq: Two times a day (BID) | ORAL | 1 refills | Status: AC

## 2021-03-01 MED ORDER — FUROSEMIDE 20 MG PO TABS
20.0000 mg | ORAL_TABLET | Freq: Two times a day (BID) | ORAL | 0 refills | Status: AC
Start: 2021-03-01 — End: 2021-03-04

## 2021-03-01 MED ORDER — POLYETHYLENE GLYCOL 3350 17 G PO PACK
17.0000 g | PACK | Freq: Every day | ORAL | Status: DC
  Administered 2021-03-01: 09:00:00 17 g via ORAL
  Filled 2021-03-01: qty 1

## 2021-03-01 MED ORDER — IBUPROFEN 600 MG PO TABS: 600.0000 mg | ORAL_TABLET | Freq: Four times a day (QID) | ORAL | 0 refills | Status: AC | PRN

## 2021-03-01 MED ORDER — OXYCODONE-ACETAMINOPHEN 5-325 MG PO TABS: 1.0000 | ORAL_TABLET | Freq: Four times a day (QID) | ORAL | 0 refills | Status: AC | PRN

## 2021-03-01 NOTE — Anesthesia Postprocedure Evaluation (Signed)
Anesthesia Post Note  Patient: Angel Moore  Procedure(s) Performed: CESAREAN SECTION (Abdomen)     Patient location during evaluation: PACU Anesthesia Type: Spinal Level of consciousness: oriented and awake and alert Pain management: pain level controlled Vital Signs Assessment: post-procedure vital signs reviewed and stable Respiratory status: spontaneous breathing, respiratory function stable and patient connected to nasal cannula oxygen Cardiovascular status: blood pressure returned to baseline and stable Postop Assessment: no headache, no backache and no apparent nausea or vomiting Anesthetic complications: no   No notable events documented.  Last Vitals:  Vitals:   03/01/21 0317 03/01/21 0650  BP: (!) 143/98 (!) 141/88  Pulse: 87 82  Resp: 18 16  Temp: 36.6 C 36.9 C  SpO2: 100% 99%    Last Pain:  Vitals:   03/01/21 0650  TempSrc: Oral  PainSc:    Pain Goal: Patients Stated Pain Goal: 3 (03/01/21 0432)                 Kiyani Jernigan

## 2021-03-06 ENCOUNTER — Encounter: Payer: Medicare Other | Admitting: Obstetrics & Gynecology

## 2021-03-12 ENCOUNTER — Telehealth (HOSPITAL_COMMUNITY): Payer: Self-pay | Admitting: *Deleted

## 2021-03-12 NOTE — Telephone Encounter (Signed)
Voice mail box full, so no message left.   Duffy Rhody, [place] 03-11-2021 at 3:25pm

## 2021-04-06 ENCOUNTER — Ambulatory Visit: Payer: Medicare Other | Admitting: Women's Health

## 2021-04-06 ENCOUNTER — Other Ambulatory Visit: Payer: Medicare Other

## 2021-04-06 DIAGNOSIS — Z8632 Personal history of gestational diabetes: Secondary | ICD-10-CM

## 2022-05-13 ENCOUNTER — Encounter: Payer: Self-pay | Admitting: Radiology
